# Patient Record
Sex: Female | Born: 1970
Health system: Southern US, Community
[De-identification: ages and names within clinical notes are randomized; demographics above are authoritative.]

## PROBLEM LIST (undated history)

## (undated) ENCOUNTER — Inpatient Hospital Stay (HOSPITAL_COMMUNITY): Payer: MEDICAID | Admitting: PULMONARY DISEASE

## (undated) ENCOUNTER — Inpatient Hospital Stay (INDEPENDENT_AMBULATORY_CARE_PROVIDER_SITE_OTHER): Payer: Self-pay | Admitting: Hematology & Oncology

## (undated) DIAGNOSIS — J449 Chronic obstructive pulmonary disease, unspecified: Secondary | ICD-10-CM

## (undated) DIAGNOSIS — F32A Depression, unspecified: Secondary | ICD-10-CM

## (undated) DIAGNOSIS — F329 Major depressive disorder, single episode, unspecified: Secondary | ICD-10-CM

## (undated) DIAGNOSIS — Z720 Tobacco use: Secondary | ICD-10-CM

## (undated) DIAGNOSIS — R7303 Prediabetes: Secondary | ICD-10-CM

## (undated) DIAGNOSIS — E785 Hyperlipidemia, unspecified: Secondary | ICD-10-CM

## (undated) HISTORY — DX: Chronic obstructive pulmonary disease, unspecified: J44.9

## (undated) HISTORY — DX: Hyperlipidemia, unspecified: E78.5

## (undated) HISTORY — PX: DENTAL SURGERY: SHX609

## (undated) HISTORY — DX: Prediabetes: R73.03

## (undated) HISTORY — PX: OTHER SURGICAL HISTORY: SHX169

## (undated) HISTORY — PX: ECTOPIC PREGNANCY SURGERY: SHX613

---

## 2017-10-03 ENCOUNTER — Emergency Department (HOSPITAL_COMMUNITY)
Admission: EM | Admit: 2017-10-03 | Discharge: 2017-10-03 | Disposition: A | Discharge status: Home / Self Care | Payer: Self-pay | Attending: Emergency Medicine | Admitting: Emergency Medicine

## 2017-10-03 ENCOUNTER — Encounter (HOSPITAL_COMMUNITY): Payer: Self-pay | Admitting: *Deleted

## 2017-10-03 ENCOUNTER — Emergency Department (HOSPITAL_COMMUNITY): Payer: Self-pay

## 2017-10-03 DIAGNOSIS — J441 Chronic obstructive pulmonary disease with (acute) exacerbation: Secondary | ICD-10-CM | POA: Insufficient documentation

## 2017-10-03 DIAGNOSIS — R05 Cough: Secondary | ICD-10-CM | POA: Insufficient documentation

## 2017-10-03 DIAGNOSIS — Z720 Tobacco use: Secondary | ICD-10-CM

## 2017-10-03 DIAGNOSIS — R000 Tachycardia, unspecified: Secondary | ICD-10-CM | POA: Insufficient documentation

## 2017-10-03 DIAGNOSIS — F17210 Nicotine dependence, cigarettes, uncomplicated: Secondary | ICD-10-CM | POA: Insufficient documentation

## 2017-10-03 DIAGNOSIS — J181 Lobar pneumonia, unspecified organism: Secondary | ICD-10-CM | POA: Insufficient documentation

## 2017-10-03 DIAGNOSIS — J189 Pneumonia, unspecified organism: Secondary | ICD-10-CM

## 2017-10-03 LAB — CBC WITH DIFFERENTIAL/PLATELET
Basophils Absolute: 0 10*3/uL (ref 0.0–0.1)
Basophils Relative: 0 %
EOS PCT: 2 %
Eosinophils Absolute: 0.2 10*3/uL (ref 0.0–0.7)
HEMATOCRIT: 40.6 % (ref 36.0–46.0)
HEMOGLOBIN: 13 g/dL (ref 12.0–15.0)
LYMPHS ABS: 0.8 10*3/uL (ref 0.7–4.0)
LYMPHS PCT: 7 %
MCH: 28.4 pg (ref 26.0–34.0)
MCHC: 32 g/dL (ref 30.0–36.0)
MCV: 88.6 fL (ref 78.0–100.0)
MONO ABS: 0.9 10*3/uL (ref 0.1–1.0)
Monocytes Relative: 7 %
Neutro Abs: 9.7 10*3/uL — ABNORMAL HIGH (ref 1.7–7.7)
Neutrophils Relative %: 84 %
Platelets: 387 10*3/uL (ref 150–400)
RBC: 4.58 MIL/uL (ref 3.87–5.11)
RDW: 17.6 % — ABNORMAL HIGH (ref 11.5–15.5)
WBC: 11.6 10*3/uL — ABNORMAL HIGH (ref 4.0–10.5)

## 2017-10-03 LAB — COMPREHENSIVE METABOLIC PANEL
ALK PHOS: 98 U/L (ref 38–126)
ALT: 20 U/L (ref 14–54)
ANION GAP: 10 (ref 5–15)
AST: 28 U/L (ref 15–41)
Albumin: 3.7 g/dL (ref 3.5–5.0)
BILIRUBIN TOTAL: 0.3 mg/dL (ref 0.3–1.2)
BUN: 10 mg/dL (ref 6–20)
CALCIUM: 8.9 mg/dL (ref 8.9–10.3)
CO2: 26 mmol/L (ref 22–32)
Chloride: 101 mmol/L (ref 101–111)
Creatinine, Ser: 0.5 mg/dL (ref 0.44–1.00)
GFR calc non Af Amer: >60 mL/min (ref >60)
Glucose, Bld: 131 mg/dL — ABNORMAL HIGH (ref 65–99)
Potassium: 4.2 mmol/L (ref 3.5–5.1)
Sodium: 137 mmol/L (ref 135–145)
TOTAL PROTEIN: 7 g/dL (ref 6.5–8.1)

## 2017-10-03 MED ORDER — ALBUTEROL SULFATE HFA 108 (90 BASE) MCG/ACT IN AERS
1.0000 | INHALATION_SPRAY | RESPIRATORY_TRACT | Status: DC | PRN
  Filled 2017-10-03: qty 6.7

## 2017-10-03 MED ORDER — ALBUTEROL (5 MG/ML) CONTINUOUS INHALATION SOLN
10.0000 mg/h | INHALATION_SOLUTION | Freq: Once | RESPIRATORY_TRACT | Status: DC
  Filled 2017-10-03: qty 0.5

## 2017-10-03 MED ORDER — DEXTROSE 5 % IV SOLN
500.0000 mg | Freq: Once | INTRAVENOUS | Status: AC
  Administered 2017-10-03: 500 mg via INTRAVENOUS
  Filled 2017-10-03: qty 500

## 2017-10-03 MED ORDER — PREDNISONE 10 MG (21) PO TBPK: ORAL_TABLET | ORAL | 0 refills | Status: DC

## 2017-10-03 MED ORDER — METHYLPREDNISOLONE SODIUM SUCC 125 MG IJ SOLR
125.0000 mg | Freq: Once | INTRAMUSCULAR | Status: AC
  Administered 2017-10-03: 125 mg via INTRAVENOUS
  Filled 2017-10-03: qty 2

## 2017-10-03 MED ORDER — AZITHROMYCIN 250 MG PO TABS: ORAL_TABLET | ORAL | 0 refills | Status: DC

## 2017-10-03 MED ORDER — AEROCHAMBER PLUS FLO-VU MEDIUM MISC
1.0000 | Freq: Once | Status: AC
  Administered 2017-10-03: 1
  Filled 2017-10-03: qty 1

## 2017-10-03 MED ORDER — ALBUTEROL SULFATE (2.5 MG/3ML) 0.083% IN NEBU
10.0000 mg | INHALATION_SOLUTION | Freq: Once | RESPIRATORY_TRACT | Status: AC
Start: 2017-10-03 — End: 2017-10-03
  Administered 2017-10-03: 10 mg via RESPIRATORY_TRACT

## 2017-10-03 MED ORDER — DEXTROSE 5 % IV SOLN
1.0000 g | Freq: Once | INTRAVENOUS | Status: AC
  Administered 2017-10-03: 1 g via INTRAVENOUS
  Filled 2017-10-03: qty 10

## 2017-10-03 NOTE — ED Provider Notes (Signed)
[facility] Provider Note   CSN: [id number] Arrival date & time: 10/03/17  1846     History   Chief Complaint Chief Complaint  Patient presents with  . Shortness of Breath    HPI Elizabeth Villarreal is a 46 y.o. female.  Pt presents to the ED today with sob.  Pt said she's been sob for the past 3-4 days.  She has been coughing up green sputum.  She denies fevers.  She has no hx of copd, but is a long time smoker.  She is trying to quit, however.  O2 sat in triage was 88%.  Pt placed on oxygen and is feeling better.      History reviewed. No pertinent past medical history.  There are no active problems to display for this patient.   History reviewed. No pertinent surgical history.  OB History    No data available       Home Medications    Prior to Admission medications   Medication Sig Start Date End Date Taking? Authorizing Provider  azithromycin (ZITHROMAX) 250 MG tablet Take 1 every day until finished. 10/04/17   Jacalyn LefevreHaviland, Keshawna Dix, MD  predniSONE (STERAPRED UNI-PAK 21 TAB) 10 MG (21) TBPK tablet Take 6 tabs by mouth daily  for 2 days, then 5 tabs for 2 days, then 4 tabs for 2 days, then 3 tabs for 2 days, 2 tabs for 2 days, then 1 tab by mouth daily for 2 days 10/03/17   Jacalyn LefevreHaviland, Jasilyn Holderman, MD    Family History No family history on file.  Social History Social History  Substance Use Topics  . Smoking status: Current Every Day Smoker    Packs/day: 1.00    Types: Cigarettes  . Smokeless tobacco: Never Used  . Alcohol use No     Allergies   Patient has no known allergies.   Review of Systems Review of Systems  Respiratory: Positive for cough, shortness of breath and wheezing.   All other systems reviewed and are negative.    Physical Exam Updated Vital Signs BP 103/63   Pulse (!) 119   Temp 98 F (36.7 C)   Resp 19   LMP 09/12/2017 (Approximate)   SpO2 98%   Physical Exam  Constitutional: She is oriented to  person, place, and time. She appears well-developed and well-nourished.  HENT:  Head: Normocephalic and atraumatic.  Right Ear: External ear normal.  Left Ear: External ear normal.  Nose: Nose normal.  Mouth/Throat: Oropharynx is clear and moist.  Eyes: Pupils are equal, round, and reactive to light. Conjunctivae and EOM are normal.  Neck: Normal range of motion. Neck supple.  Cardiovascular: Regular rhythm, normal heart sounds and intact distal pulses.  Tachycardia present.   Pulmonary/Chest: Effort normal. She has wheezes.  Abdominal: Soft. Bowel sounds are normal.  Musculoskeletal: Normal range of motion.  Neurological: She is alert and oriented to person, place, and time.  Skin: Skin is warm. Capillary refill takes less than 2 seconds.  Psychiatric: She has a normal mood and affect. Her behavior is normal. Judgment and thought content normal.  Nursing note and vitals reviewed.    ED Treatments / Results  Labs (all labs ordered are listed, but only abnormal results are displayed) Labs Reviewed  CBC WITH DIFFERENTIAL/PLATELET - Abnormal; Notable for the following:       Result Value   WBC 11.6 (*)    RDW 17.6 (*)    Neutro Abs 9.7 (*)  All other components within normal limits  COMPREHENSIVE METABOLIC PANEL - Abnormal; Notable for the following:    Glucose, Bld 131 (*)    All other components within normal limits    EKG:  Sinus tachy  HR 120  Radiology Dg Chest 2 View  Result Date: 10/03/2017 CLINICAL DATA:  Dyspnea and productive cough x2 days. EXAM: CHEST  2 VIEW COMPARISON:  None. FINDINGS: Emphysematous hyperinflation of the lungs consistent with COPD. Subtle subpleural right middle and right lower lobe airspace opacities are noted for which pneumonia is not entirely excluded. No pneumothorax or CHF. Heart and mediastinal contours are within normal limits. IMPRESSION: COPD. Subtle airspace opacities in the right mid to lower lung may reflect superimposed pneumonia.  Electronically Signed   By: Tollie Eth M.D.   On: 10/03/2017 19:56    Procedures Procedures (including critical care time)  Medications Ordered in ED Medications  azithromycin (ZITHROMAX) 500 mg in dextrose 5 % 250 mL IVPB (500 mg Intravenous New Bag/Given 10/03/17 2107)  albuterol (PROVENTIL HFA;VENTOLIN HFA) 108 (90 Base) MCG/ACT inhaler 1-2 puff (not administered)  AEROCHAMBER PLUS FLO-VU MEDIUM MISC 1 each (not administered)  methylPREDNISolone sodium succinate (SOLU-MEDROL) 125 mg/2 mL injection 125 mg (125 mg Intravenous Given 10/03/17 2107)  cefTRIAXone (ROCEPHIN) 1 g in dextrose 5 % 50 mL IVPB (1 g Intravenous New Bag/Given 10/03/17 2107)  albuterol (PROVENTIL) (2.5 MG/3ML) 0.083% nebulizer solution 10 mg (10 mg Nebulization Given 10/03/17 2017)     Initial Impression / Assessment and Plan / ED Course  I have reviewed the triage vital signs and the nursing notes.  Pertinent labs & imaging results that were available during my care of the patient were reviewed by me and considered in my medical decision making (see chart for details).    Pt is feeling much better after continuous neb and IV solumedrol.  She is able to ambulate without getting sob.  She knows to stop smoking.  She knows to return if worse.  She is given an albuterol mdi with spacer prior to d/c.  Final Clinical Impressions(s) / ED Diagnoses   Final diagnoses:  COPD exacerbation (HCC)  Tobacco abuse  Community acquired pneumonia of right lower lobe of lung (HCC)    New Prescriptions New Prescriptions   AZITHROMYCIN (ZITHROMAX) 250 MG TABLET    Take 1 every day until finished.   PREDNISONE (STERAPRED UNI-PAK 21 TAB) 10 MG (21) TBPK TABLET    Take 6 tabs by mouth daily  for 2 days, then 5 tabs for 2 days, then 4 tabs for 2 days, then 3 tabs for 2 days, 2 tabs for 2 days, then 1 tab by mouth daily for 2 days     Jacalyn Lefevre, MD 10/03/17 2147

## 2017-10-03 NOTE — ED Notes (Signed)
Ambulated pt in hall, O2 dropped to 88 once but went back up to the 90s.

## 2017-10-03 NOTE — ED Triage Notes (Signed)
Pt in c/o SOB with no productive cough onset x 3-4 days, pt denies CP, denies n/v/d, pt A&O x4

## 2017-10-03 NOTE — ED Notes (Signed)
Pt taken to xray then will go to room

## 2017-10-03 NOTE — ED Notes (Signed)
  CAT started.  

## 2018-01-28 ENCOUNTER — Inpatient Hospital Stay (HOSPITAL_COMMUNITY)
Admission: EM | Admit: 2018-01-28 | Discharge: 2018-01-31 | DRG: 190 | Disposition: A | Discharge status: Home / Self Care | Payer: Medicaid Other | Attending: Internal Medicine | Admitting: Internal Medicine

## 2018-01-28 ENCOUNTER — Emergency Department (HOSPITAL_COMMUNITY): Payer: Medicaid Other

## 2018-01-28 ENCOUNTER — Other Ambulatory Visit: Payer: Self-pay

## 2018-01-28 DIAGNOSIS — Z720 Tobacco use: Secondary | ICD-10-CM

## 2018-01-28 DIAGNOSIS — J441 Chronic obstructive pulmonary disease with (acute) exacerbation: Principal | ICD-10-CM | POA: Diagnosis present

## 2018-01-28 DIAGNOSIS — Z87891 Personal history of nicotine dependence: Secondary | ICD-10-CM | POA: Diagnosis present

## 2018-01-28 DIAGNOSIS — Z825 Family history of asthma and other chronic lower respiratory diseases: Secondary | ICD-10-CM

## 2018-01-28 DIAGNOSIS — J449 Chronic obstructive pulmonary disease, unspecified: Secondary | ICD-10-CM | POA: Diagnosis present

## 2018-01-28 DIAGNOSIS — R109 Unspecified abdominal pain: Secondary | ICD-10-CM | POA: Diagnosis present

## 2018-01-28 DIAGNOSIS — Z681 Body mass index (BMI) 19 or less, adult: Secondary | ICD-10-CM

## 2018-01-28 DIAGNOSIS — R911 Solitary pulmonary nodule: Secondary | ICD-10-CM | POA: Diagnosis present

## 2018-01-28 DIAGNOSIS — R0902 Hypoxemia: Secondary | ICD-10-CM

## 2018-01-28 DIAGNOSIS — Z23 Encounter for immunization: Secondary | ICD-10-CM

## 2018-01-28 DIAGNOSIS — E43 Unspecified severe protein-calorie malnutrition: Secondary | ICD-10-CM | POA: Diagnosis present

## 2018-01-28 DIAGNOSIS — J9621 Acute and chronic respiratory failure with hypoxia: Secondary | ICD-10-CM | POA: Diagnosis present

## 2018-01-28 DIAGNOSIS — Z801 Family history of malignant neoplasm of trachea, bronchus and lung: Secondary | ICD-10-CM

## 2018-01-28 DIAGNOSIS — F17210 Nicotine dependence, cigarettes, uncomplicated: Secondary | ICD-10-CM | POA: Diagnosis present

## 2018-01-28 DIAGNOSIS — R52 Pain, unspecified: Secondary | ICD-10-CM

## 2018-01-28 HISTORY — DX: Chronic obstructive pulmonary disease, unspecified: J44.9

## 2018-01-28 HISTORY — DX: Tobacco use: Z72.0

## 2018-01-28 LAB — BASIC METABOLIC PANEL
ANION GAP: 11 (ref 5–15)
BUN: <5 mg/dL — ABNORMAL LOW (ref 6–20)
CALCIUM: 9.1 mg/dL (ref 8.9–10.3)
CO2: 22 mmol/L (ref 22–32)
CREATININE: 0.57 mg/dL (ref 0.44–1.00)
Chloride: 106 mmol/L (ref 101–111)
Glucose, Bld: 110 mg/dL — ABNORMAL HIGH (ref 65–99)
Potassium: 4 mmol/L (ref 3.5–5.1)
SODIUM: 139 mmol/L (ref 135–145)

## 2018-01-28 LAB — I-STAT CG4 LACTIC ACID, ED: LACTIC ACID, VENOUS: 0.41 mmol/L — AB (ref 0.5–1.9)

## 2018-01-28 LAB — I-STAT BETA HCG BLOOD, ED (MC, WL, AP ONLY)

## 2018-01-28 LAB — CBC
HCT: 42.1 % (ref 36.0–46.0)
HEMOGLOBIN: 13.7 g/dL (ref 12.0–15.0)
MCH: 28.1 pg (ref 26.0–34.0)
MCHC: 32.5 g/dL (ref 30.0–36.0)
MCV: 86.4 fL (ref 78.0–100.0)
PLATELETS: 408 10*3/uL — AB (ref 150–400)
RBC: 4.87 MIL/uL (ref 3.87–5.11)
WBC: 12.7 10*3/uL — AB (ref 4.0–10.5)

## 2018-01-28 MED ORDER — IPRATROPIUM-ALBUTEROL 0.5-2.5 (3) MG/3ML IN SOLN
3.0000 mL | Freq: Once | RESPIRATORY_TRACT | Status: AC
  Administered 2018-01-28: 3 mL via RESPIRATORY_TRACT
  Filled 2018-01-28: qty 3

## 2018-01-28 MED ORDER — IOPAMIDOL (ISOVUE-370) INJECTION 76%
INTRAVENOUS | Status: AC
  Administered 2018-01-28: 100 mL
  Filled 2018-01-28: qty 100

## 2018-01-28 MED ORDER — FENTANYL CITRATE (PF) 100 MCG/2ML IJ SOLN
50.0000 ug | Freq: Once | INTRAMUSCULAR | Status: AC
  Administered 2018-01-28: 50 ug via INTRAVENOUS
  Filled 2018-01-28: qty 2

## 2018-01-28 MED ORDER — CYCLOBENZAPRINE HCL 10 MG PO TABS
10.0000 mg | ORAL_TABLET | Freq: Once | ORAL | Status: DC
  Filled 2018-01-28: qty 1

## 2018-01-28 MED ORDER — FENTANYL CITRATE (PF) 100 MCG/2ML IJ SOLN
50.0000 ug | Freq: Once | INTRAMUSCULAR | Status: DC
  Filled 2018-01-28: qty 2

## 2018-01-28 NOTE — ED Provider Notes (Signed)
[facility] EMERGENCY DEPARTMENT Provider Note   CSN: [id number] Arrival date & time: 01/28/18  1946     History   Chief Complaint Chief Complaint  Patient presents with  . Left Rib Pain  . Productive Cough    HPI Elizabeth Villarreal is a 47 y.o. female.  Patient with PMH of COPD presents to the ED with a chief complaint of left sided chest/rib pain and SOB that started on Wednesday and worsened today.  She reports severe pain.  She states that it is difficult to catch her breath.  She states that she was diagnosed with pneumonia and COPD at the end of last year.  She states that she has had a bad cough, but denies running a fever.  She reports having a hard time gaining weight.  She denies any lower extremity pain or swelling, but states that it is hard to be up or active for long because of her SOB.  She denies hx of PE/DVT.  She states that today she rolled over in bed and felt something "pop" in her left side/ribs, followed by severe pain.   The history is provided by the patient. No language interpreter was used.    No past medical history on file.  There are no active problems to display for this patient.   No past surgical history on file.  OB History    No data available       Home Medications    Prior to Admission medications   Medication Sig Start Date End Date Taking? Authorizing Provider  azithromycin (ZITHROMAX) 250 MG tablet Take 1 every day until finished. 10/04/17   Jacalyn Lefevre, MD  Cyanocobalamin (VITAMIN B-12 PO) Take 1 tablet by mouth daily.     [provider]  ferrous sulfate 325 (65 FE) MG EC tablet Take 325 mg by mouth 2 (two) times a week.    [provider]  guaiFENesin-dextromethorphan (ROBITUSSIN DM) 100-10 MG/5ML syrup Take 5-10 mLs by mouth every 4 (four) hours as needed for cough.    [provider]  Multiple Vitamins-Calcium (ONE-A-DAY WOMENS FORMULA PO) Take 1 tablet by mouth daily.     [provider]  predniSONE (STERAPRED UNI-PAK 21 TAB) 10 MG (21) TBPK tablet Take 6 tabs by mouth daily  for 2 days, then 5 tabs for 2 days, then 4 tabs for 2 days, then 3 tabs for 2 days, 2 tabs for 2 days, then 1 tab by mouth daily for 2 days 10/03/17   Jacalyn Lefevre, MD    Family History No family history on file.  Social History Social History   Tobacco Use  . Smoking status: Current Every Day Smoker    Packs/day: 1.00    Types: Cigarettes  . Smokeless tobacco: Never Used  Substance Use Topics  . Alcohol use: No  . Drug use: No     Allergies   Darvon [propoxyphene] and Percocet [oxycodone-acetaminophen]   Review of Systems Review of Systems  All other systems reviewed and are negative.    Physical Exam Updated Vital Signs BP (!) 136/92 (BP Location: Right Arm)   Pulse 98   Temp 98.7 F (37.1 C) (Oral)   Resp 18   Ht 5\' 8"  (1.727 m)   Wt 44 kg (97 lb)   LMP 01/14/2018   SpO2 94%   BMI 14.75 kg/m   Physical Exam  Constitutional: She is oriented to person, place, and time.  Frail, chronically ill appearing  HENT:  Head: Normocephalic and atraumatic.  Eyes: Conjunctivae and EOM are normal. Pupils are equal, round, and reactive to light.  Neck: Normal range of motion. Neck supple.  Cardiovascular: Regular rhythm. Exam reveals no gallop and no friction rub.  No murmur heard. tachycardic  Pulmonary/Chest: Effort normal and breath sounds normal. No respiratory distress. She has no wheezes. She has no rales. She exhibits tenderness.  Left lateral chest wall/ribs TTP  Abdominal: Soft. Bowel sounds are normal. She exhibits no distension and no mass. There is no tenderness. There is no rebound and no guarding.  Musculoskeletal: Normal range of motion. She exhibits no edema or tenderness.  No swelling or tenderness of lower extremities  Neurological: She is alert and oriented to person, place, and time.  Skin: Skin is warm and dry.  Psychiatric: She has a normal  mood and affect. Her behavior is normal. Judgment and thought content normal.  Nursing note and vitals reviewed.    ED Treatments / Results  Labs (all labs ordered are listed, but only abnormal results are displayed) Labs Reviewed  BASIC METABOLIC PANEL - Abnormal; Notable for the following components:      Result Value   Glucose, Bld 110 (*)    BUN <5 (*)    All other components within normal limits  CBC - Abnormal; Notable for the following components:   WBC 12.7 (*)    Platelets 408 (*)    All other components within normal limits  I-STAT CG4 LACTIC ACID, ED - Abnormal; Notable for the following components:   Lactic Acid, Venous 0.41 (*)    All other components within normal limits  CULTURE, BLOOD (ROUTINE X 2)  CULTURE, BLOOD (ROUTINE X 2)  CULTURE, EXPECTORATED SPUTUM-ASSESSMENT  GRAM STAIN  INFLUENZA PANEL BY PCR (TYPE A & B)  HIV ANTIBODY (ROUTINE TESTING)  STREP PNEUMONIAE URINARY ANTIGEN  I-STAT BETA HCG BLOOD, ED ([facility], [facility], [facility] ONLY)  I-STAT CG4 LACTIC ACID, ED    EKG  EKG Interpretation  Date/Time:  Sunday January 28 2018 22:39:50 EST Ventricular Rate:  94 PR Interval:    QRS Duration: 92 QT Interval:  336 QTC Calculation: 421 R Axis:   -93 Text Interpretation:  Sinus rhythm Right atrial enlargement Markedly posterior QRS axis Since last tracing rate slower Confirmed by Doug SouJacubowitz, Sam [phone]([id number]) on 01/28/2018 11:22:11 PM       Radiology Dg Chest 2 View  Result Date: 01/28/2018 CLINICAL DATA:  Rib pain, productive cough EXAM: CHEST  2 VIEW COMPARISON:  10/03/2017 FINDINGS: Severe COPD changes. Heart and mediastinal contours are within normal limits. No focal opacities or effusions. No acute bony abnormality. IMPRESSION: Severe COPD.  No active cardiopulmonary disease. Electronically Signed   By: Charlett NoseKevin  Dover M.D.   On: 01/28/2018 20:41    Procedures Procedures (including critical care time)  Medications Ordered in ED Medications  fentaNYL (SUBLIMAZE)  injection 50 mcg (not administered)     Initial Impression / Assessment and Plan / ED Course  I have reviewed the triage vital signs and the nursing notes.  Pertinent labs & imaging results that were available during my care of the patient were reviewed by me and considered in my medical decision making (see chart for details).     Patient with left sided chest/ribs pain, SOB, hypoxia, and bad cough.  Noted to be satting at 86% on RA on arrival.  Feels SOB.  Afebrile.  Mild leukocytosis.  Patient also noted to be tachycardic.  High degree of suspicion  for PE, will proceed with CT to further evaluated CP and hypoxia.  Will give duoneb.  CT is negative for PE, there is debris in the trachea and bronchi consistent with aspiration.  Patient does have a moderate leukocytosis, however she is afebrile.  She does have a coarse wet cough.  Given her tachycardia, hypoxia, leukocytosis with debris in the trachea and bronchi, will cover with broad-spectrum antibiotics for aspiration, however symptoms could also be secondary to COPD exacerbation.  Patient will require admission due to hypoxia.  Discussed with Dr. Judd Lien, who agrees with the plan.    Final Clinical Impressions(s) / ED Diagnoses   Final diagnoses:  Hypoxia    ED Discharge Orders    None       Roxy Horseman, PA-C 01/29/18 0128    Doug Sou, MD 01/29/18 [phone]

## 2018-01-28 NOTE — H&P (Addendum)
History and Physical    Elizabeth AsaMary Villarreal NWG:[id number] DOB: [birth year] DOA: 01/28/2018  Referring MD/NP/PA:   PCP: System, Pcp Not In   Patient coming from:  The patient is coming from home.  At baseline, pt is independent for most of ADL.   Chief Complaint: SOB, cough and left flank pain  HPI: Elizabeth AsaMary Villarreal is a 47 y.o. female with medical history significant of tobacco abuse, COPD, who presents with cough, shortness breath, left flank pain.  Patient states that she has been having cough and shortness of breath in the past 4 days, which has been progressively getting worse. She coughs up greenish colored sputum. She also reports left flank pain, which is constant, sharp, 7 out of 10 in severity, aggravated by deep breath and coughing. No tenderness in the calf areas. She states that she was diagnosed with pneumonia and COPD at the end of last year. Patient does not have runny nose, sore throat. No fever or chills. Patient states that she has always been having poor appetite and having a hard time gaining weight. Patient speaks in full sentence.  ED Course: pt was found to have WBC 12.2, electrolytes renal function okay, negative pregnancy test, temperature normal, heart rate in 90s, oxygen saturation 86% on room air. Chest x-ray showed emphysematous changes and COPD. Patient is placed on telemetry bed for observation.  CT angiogram of the chest showed:  1. No acute pulmonary embolism. 2. Debris within trachea and bronchi concerning for aspiration. 3. Severe emphysema. 4. 14 mm RIGHT lower lobe pulmonary nodule with central calcification most compatible with healing granuloma.   Review of Systems:   General: no fevers, chills, no body weight gain, has poor appetite, has fatigue HEENT: no blurry vision, hearing changes or sore throat Respiratory: has dyspnea, coughing, no wheezing CV: no chest pain, no palpitations GI: no nausea, vomiting, abdominal pain, diarrhea, constipation GU: no dysuria,  burning on urination, increased urinary frequency, hematuria  Ext: no leg edema Neuro: no unilateral weakness, numbness, or tingling, no vision change or hearing loss Skin: no rash, no skin tear. MSK: No muscle spasm, no deformity, no limitation of range of movement in spin Heme: No easy bruising.  Travel history: No recent long distant travel.  Allergy:  Allergies  Allergen Reactions  . Darvon [Propoxyphene] Other (See Comments)    Patient was aware her spoken words were not matching her thoughts  . Percocet [Oxycodone-Acetaminophen] Other (See Comments)    Patient was aware her spoken words were not matching her thoughts    Past Medical History:  Diagnosis Date  . COPD (chronic obstructive pulmonary disease) (HCC)   . Tobacco abuse     Past Surgical History:  Procedure Laterality Date  . CESAREAN SECTION    . ovary cyst rupture      Social History:  reports that she has been smoking cigarettes.  She has been smoking about 1.00 pack per day. she has never used smokeless tobacco. She reports that she does not drink alcohol or use drugs.  Family History:  Family History  Problem Relation Age of Onset  . COPD Mother   . Lung cancer Father      Prior to Admission medications   Medication Sig Start Date End Date Taking? Authorizing Provider  azithromycin (ZITHROMAX) 250 MG tablet Take 1 every day until finished. 10/04/17   Jacalyn LefevreHaviland, Julie, MD  Cyanocobalamin (VITAMIN B-12 PO) Take 1 tablet by mouth daily.     [provider]  ferrous sulfate  325 (65 FE) MG EC tablet Take 325 mg by mouth 2 (two) times a week.    [provider]  guaiFENesin-dextromethorphan (ROBITUSSIN DM) 100-10 MG/5ML syrup Take 5-10 mLs by mouth every 4 (four) hours as needed for cough.    [provider]  Multiple Vitamins-Calcium (ONE-A-DAY WOMENS FORMULA PO) Take 1 tablet by mouth daily.     [provider]  predniSONE (STERAPRED UNI-PAK 21 TAB) 10 MG (21) TBPK tablet  Take 6 tabs by mouth daily  for 2 days, then 5 tabs for 2 days, then 4 tabs for 2 days, then 3 tabs for 2 days, 2 tabs for 2 days, then 1 tab by mouth daily for 2 days 10/03/17   Jacalyn Lefevre, MD    Physical Exam: Vitals:   01/28/18 2000 01/28/18 2002  BP:  (!) 136/92  Pulse:  98  Resp:  18  Temp:  98.7 F (37.1 C)  TempSrc:  Oral  SpO2:  94%  Weight: 44 kg (97 lb)   Height: 5\' 8"  (1.727 m)    General: Not in acute distress. Cachectic looking.  HEENT:       Eyes: PERRL, EOMI, no scleral icterus.       ENT: No discharge from the ears and nose, no pharynx injection, no tonsillar enlargement.        Neck: No JVD, no bruit, no mass felt. Heme: No neck lymph node enlargement. Cardiac: S1/S2, RRR, No murmurs, No gallops or rubs. Respiratory: Has mild rhonchi bilaterally GI: Soft, nondistended, nontender, no rebound pain, no organomegaly, BS present. GU: No hematuria Ext: No pitting leg edema bilaterally. 2+DP/PT pulse bilaterally. Musculoskeletal: No joint deformities, No joint redness or warmth, no limitation of ROM in spin. Skin: No rashes.  Neuro: Alert, oriented X3, cranial nerves II-XII grossly intact, moves all extremities normally. Psych: Patient is not psychotic, no suicidal or hemocidal ideation.  Labs on Admission: I have personally reviewed following labs and imaging studies  CBC: Recent Labs  Lab 01/28/18 2009  WBC 12.7*  HGB 13.7  HCT 42.1  MCV 86.4  PLT 408*   Basic Metabolic Panel: Recent Labs  Lab 01/28/18 2009  NA 139  K 4.0  CL 106  CO2 22  GLUCOSE 110*  BUN <5*  CREATININE 0.57  CALCIUM 9.1   GFR: Estimated Creatinine Clearance: 61 mL/min (by C-G formula based on SCr of 0.57 mg/dL). Liver Function Tests: No results for input(s): AST, ALT, ALKPHOS, BILITOT, PROT, ALBUMIN in the last 168 hours. No results for input(s): LIPASE, AMYLASE in the last 168 hours. No results for input(s): AMMONIA in the last 168 hours. Coagulation Profile: No  results for input(s): INR, PROTIME in the last 168 hours. Cardiac Enzymes: No results for input(s): CKTOTAL, CKMB, CKMBINDEX, TROPONINI in the last 168 hours. BNP (last 3 results) No results for input(s): PROBNP in the last 8760 hours. HbA1C: No results for input(s): HGBA1C in the last 72 hours. CBG: No results for input(s): GLUCAP in the last 168 hours. Lipid Profile: No results for input(s): CHOL, HDL, LDLCALC, TRIG, CHOLHDL, LDLDIRECT in the last 72 hours. Thyroid Function Tests: No results for input(s): TSH, T4TOTAL, FREET4, T3FREE, THYROIDAB in the last 72 hours. Anemia Panel: No results for input(s): VITAMINB12, FOLATE, FERRITIN, TIBC, IRON, RETICCTPCT in the last 72 hours. Urine analysis: No results found for: COLORURINE, APPEARANCEUR, LABSPEC, PHURINE, GLUCOSEU, HGBUR, BILIRUBINUR, KETONESUR, PROTEINUR, UROBILINOGEN, NITRITE, LEUKOCYTESUR Sepsis Labs: @LABRCNTIP (procalcitonin:4,lacticidven:4) )No results found for this or any previous visit (from the  past 240 hour(s)).   Radiological Exams on Admission: Dg Chest 2 View  Result Date: 01/28/2018 CLINICAL DATA:  Rib pain, productive cough EXAM: CHEST  2 VIEW COMPARISON:  10/03/2017 FINDINGS: Severe COPD changes. Heart and mediastinal contours are within normal limits. No focal opacities or effusions. No acute bony abnormality. IMPRESSION: Severe COPD.  No active cardiopulmonary disease. Electronically Signed   By: Charlett Nose M.D.   On: 01/28/2018 20:41   Ct Angio Chest Pe W And/or Wo Contrast  Result Date: 01/28/2018 CLINICAL DATA:  Worsening RIGHT rib pain radiating to LEFT back. Shortness of breath. History of COPD. EXAM: CT ANGIOGRAPHY CHEST WITH CONTRAST TECHNIQUE: Multidetector CT imaging of the chest was performed using the standard protocol during bolus administration of intravenous contrast. Multiplanar CT image reconstructions and MIPs were obtained to evaluate the vascular anatomy. CONTRAST:  ISOVUE-370 IOPAMIDOL  (ISOVUE-370) INJECTION 76% COMPARISON:  Chest radiograph January 28, 2018 FINDINGS: CARDIOVASCULAR: Adequate contrast opacification of the pulmonary artery's. Main pulmonary artery is not enlarged. No pulmonary arterial filling defects to the level of the subsegmental branches. Heart size is normal, no right heart strain. No pericardial effusion. Thoracic aorta is normal course and caliber, unremarkable. MEDIASTINUM/NODES: No lymphadenopathy by CT size criteria. LUNGS/PLEURA: Debris layering within trachea and bilateral mainstem bronchi. Bronchial wall thickening. Severe centrilobular emphysema most apparent within lung apices. No pleural effusion or focal consolidation. Lobulated 15 mm solid nodule superior segment RIGHT lower lobe with calcification (68/157). Lingular scarring. UPPER ABDOMEN: Trace calcific atherosclerosis abdominal aorta. MUSCULOSKELETAL: Nonacute. Patient is cachectic. Centered thoracic kyphosis with upper thoracic mild spondylosis. Review of the MIP images confirms the above findings. IMPRESSION: 1. No acute pulmonary embolism. 2. Debris within trachea and bronchi concerning for aspiration. 3. Severe emphysema. 4. 14 mm RIGHT lower lobe pulmonary nodule with central calcification most compatible with healing granuloma. No routine indicated follow-up by consensus. Emphysema (ICD10-J43.9). Electronically Signed   By: Awilda Metro M.D.   On: 01/28/2018 23:23     EKG: Independently reviewed.  Sinus rhythm, QTC 421, low voltage, right atrial enlargement.   Assessment/Plan Principal Problem:   COPD exacerbation (HCC) Active Problems:   Tobacco abuse   Acute on chronic respiratory failure with hypoxia (HCC)   Protein-calorie malnutrition, severe (HCC)   Acute on chronic respiratory failure with hypoxia due to COPD exacerbation (HCC): -will place on tele bed for obs -Nebulizers: scheduled Duoneb and prn albuterol -Solu-Medrol 40 mg IV bid -Doxycycline -Flagyl due to  concerning for aspiration -Mucinex for cough  -Incentive spirometry -Urine S. pneumococcal antigen -Follow up blood culture x2, sputum culture, Flu pcr -Nasal cannula oxygen as needed to maintain O2 saturation 92% or greater -prn Norco and flexeril for left flank pain  Tobacco abuse : -Did counseling about importance of quitting smoking -Nicotine patch  Protein-calorie malnutrition, severe (HCC): -nutrition consult  Lung nodule: CAT showed 14 mm RIGHT lower lobe pulmonary nodule with central calcification, which is most compatible with healing granuloma per the radiologist's report -f/u with PCP   DVT ppx:   SQ Lovenox Code Status: Full code Family Communication:      Yes, patient's daughter at bed side Disposition Plan:  Anticipate discharge back to previous home environment Consults called:  none Admission status: Obs / tele     Date of Service 01/29/2018    Lorretta Harp [facility] Pager [phone]  If 7PM-7AM, please contact night-coverage [email or web address] Password [facility] 01/29/2018, 12:34 AM

## 2018-01-28 NOTE — ED Triage Notes (Signed)
Pt reports ongoing and worsening rib pain to the back left side. Pt reports that she woke a few days ago and felt that when she woke up she felt like she has  "cracked" rib and the pain has gotten progressively worse. Pt diagnosed with COPD in October after admission for PNA. Pt states she has had a continuous productive cough for weeks.

## 2018-01-28 NOTE — ED Notes (Signed)
O2 sat 86% on room air on arrival to room.  Placed on O2 at 2L via [place].  To continue to monitor.

## 2018-01-29 ENCOUNTER — Encounter (HOSPITAL_COMMUNITY): Payer: Self-pay | Admitting: Internal Medicine

## 2018-01-29 ENCOUNTER — Other Ambulatory Visit: Payer: Self-pay

## 2018-01-29 DIAGNOSIS — J9621 Acute and chronic respiratory failure with hypoxia: Secondary | ICD-10-CM | POA: Diagnosis present

## 2018-01-29 DIAGNOSIS — R52 Pain, unspecified: Secondary | ICD-10-CM | POA: Diagnosis present

## 2018-01-29 DIAGNOSIS — Z23 Encounter for immunization: Secondary | ICD-10-CM | POA: Diagnosis not present

## 2018-01-29 DIAGNOSIS — Z87891 Personal history of nicotine dependence: Secondary | ICD-10-CM | POA: Diagnosis present

## 2018-01-29 DIAGNOSIS — E43 Unspecified severe protein-calorie malnutrition: Secondary | ICD-10-CM | POA: Diagnosis present

## 2018-01-29 DIAGNOSIS — Z681 Body mass index (BMI) 19 or less, adult: Secondary | ICD-10-CM | POA: Diagnosis not present

## 2018-01-29 DIAGNOSIS — R911 Solitary pulmonary nodule: Secondary | ICD-10-CM | POA: Diagnosis present

## 2018-01-29 DIAGNOSIS — J449 Chronic obstructive pulmonary disease, unspecified: Secondary | ICD-10-CM | POA: Diagnosis present

## 2018-01-29 DIAGNOSIS — Z801 Family history of malignant neoplasm of trachea, bronchus and lung: Secondary | ICD-10-CM | POA: Diagnosis not present

## 2018-01-29 DIAGNOSIS — F17210 Nicotine dependence, cigarettes, uncomplicated: Secondary | ICD-10-CM | POA: Diagnosis present

## 2018-01-29 DIAGNOSIS — Z825 Family history of asthma and other chronic lower respiratory diseases: Secondary | ICD-10-CM | POA: Diagnosis not present

## 2018-01-29 DIAGNOSIS — J441 Chronic obstructive pulmonary disease with (acute) exacerbation: Secondary | ICD-10-CM | POA: Diagnosis not present

## 2018-01-29 DIAGNOSIS — R109 Unspecified abdominal pain: Secondary | ICD-10-CM | POA: Diagnosis present

## 2018-01-29 LAB — HIV ANTIBODY (ROUTINE TESTING W REFLEX): HIV Screen 4th Generation wRfx: NONREACTIVE

## 2018-01-29 LAB — I-STAT CG4 LACTIC ACID, ED: Lactic Acid, Venous: 0.57 mmol/L (ref 0.5–1.9)

## 2018-01-29 LAB — STREP PNEUMONIAE URINARY ANTIGEN: Strep Pneumo Urinary Antigen: NEGATIVE

## 2018-01-29 LAB — INFLUENZA PANEL BY PCR (TYPE A & B)
INFLAPCR: NEGATIVE
INFLBPCR: NEGATIVE

## 2018-01-29 MED ORDER — METHYLPREDNISOLONE SODIUM SUCC 40 MG IJ SOLR
40.0000 mg | Freq: Two times a day (BID) | INTRAMUSCULAR | Status: DC
  Administered 2018-01-29 – 2018-01-30 (×5): 40 mg via INTRAVENOUS
  Filled 2018-01-29 (×5): qty 1

## 2018-01-29 MED ORDER — ZOLPIDEM TARTRATE 5 MG PO TABS: 5.0000 mg | ORAL_TABLET | Freq: Every evening | ORAL | Status: DC | PRN

## 2018-01-29 MED ORDER — SODIUM CHLORIDE 0.9 % IV SOLN
INTRAVENOUS | Status: DC
  Administered 2018-01-29 (×2): via INTRAVENOUS

## 2018-01-29 MED ORDER — ACETAMINOPHEN 325 MG PO TABS: 650.0000 mg | ORAL_TABLET | Freq: Four times a day (QID) | ORAL | Status: DC | PRN

## 2018-01-29 MED ORDER — IPRATROPIUM-ALBUTEROL 0.5-2.5 (3) MG/3ML IN SOLN
3.0000 mL | Freq: Four times a day (QID) | RESPIRATORY_TRACT | Status: DC
  Administered 2018-01-30 – 2018-01-31 (×6): 3 mL via RESPIRATORY_TRACT
  Filled 2018-01-29 (×6): qty 3

## 2018-01-29 MED ORDER — PNEUMOCOCCAL VAC POLYVALENT 25 MCG/0.5ML IJ INJ
0.5000 mL | INJECTION | INTRAMUSCULAR | Status: AC
  Administered 2018-01-30: 0.5 mL via INTRAMUSCULAR
  Filled 2018-01-29: qty 0.5

## 2018-01-29 MED ORDER — NICOTINE 21 MG/24HR TD PT24
21.0000 mg | MEDICATED_PATCH | Freq: Every day | TRANSDERMAL | Status: DC
Start: 2018-01-29 — End: 2018-01-31
  Administered 2018-01-29 – 2018-01-31 (×3): 21 mg via TRANSDERMAL
  Filled 2018-01-29 (×3): qty 1

## 2018-01-29 MED ORDER — ADULT MULTIVITAMIN W/MINERALS CH
1.0000 | ORAL_TABLET | Freq: Every day | ORAL | Status: DC
  Administered 2018-01-29 – 2018-01-31 (×3): 1 via ORAL
  Filled 2018-01-29 (×3): qty 1

## 2018-01-29 MED ORDER — ONDANSETRON HCL 4 MG/2ML IJ SOLN: 4.0000 mg | Freq: Three times a day (TID) | INTRAMUSCULAR | Status: DC | PRN

## 2018-01-29 MED ORDER — GUAIFENESIN-DM 100-10 MG/5ML PO SYRP
5.0000 mL | ORAL_SOLUTION | ORAL | Status: DC | PRN
  Administered 2018-01-29: 5 mL via ORAL
  Administered 2018-01-29: 10 mL via ORAL
  Administered 2018-01-30: 5 mL via ORAL
  Filled 2018-01-29: qty 5
  Filled 2018-01-29: qty 10
  Filled 2018-01-29: qty 5

## 2018-01-29 MED ORDER — CYCLOBENZAPRINE HCL 10 MG PO TABS
10.0000 mg | ORAL_TABLET | Freq: Once | ORAL | Status: AC
  Administered 2018-01-29: 10 mg via ORAL

## 2018-01-29 MED ORDER — HYDROCODONE-ACETAMINOPHEN 5-325 MG PO TABS
1.0000 | ORAL_TABLET | Freq: Four times a day (QID) | ORAL | Status: DC | PRN
  Administered 2018-01-29: 1 via ORAL
  Filled 2018-01-29: qty 1

## 2018-01-29 MED ORDER — FERROUS SULFATE 325 (65 FE) MG PO TABS
325.0000 mg | ORAL_TABLET | ORAL | Status: DC
  Administered 2018-01-29: 325 mg via ORAL
  Filled 2018-01-29: qty 1

## 2018-01-29 MED ORDER — SODIUM CHLORIDE 0.9 % IV BOLUS (SEPSIS)
1500.0000 mL | Freq: Once | INTRAVENOUS | Status: AC
  Administered 2018-01-29: 1500 mL via INTRAVENOUS

## 2018-01-29 MED ORDER — METRONIDAZOLE 500 MG PO TABS
500.0000 mg | ORAL_TABLET | Freq: Three times a day (TID) | ORAL | Status: DC
  Administered 2018-01-29: 500 mg via ORAL
  Filled 2018-01-29 (×2): qty 1

## 2018-01-29 MED ORDER — INFLUENZA VAC SPLIT QUAD 0.5 ML IM SUSY
0.5000 mL | PREFILLED_SYRINGE | INTRAMUSCULAR | Status: AC
  Administered 2018-01-30: 0.5 mL via INTRAMUSCULAR
  Filled 2018-01-29: qty 0.5

## 2018-01-29 MED ORDER — ENOXAPARIN SODIUM 30 MG/0.3ML ~~LOC~~ SOLN
30.0000 mg | SUBCUTANEOUS | Status: DC
  Administered 2018-01-29 – 2018-01-31 (×3): 30 mg via SUBCUTANEOUS
  Filled 2018-01-29 (×4): qty 0.3

## 2018-01-29 MED ORDER — ALBUTEROL SULFATE (2.5 MG/3ML) 0.083% IN NEBU: 2.5000 mg | INHALATION_SOLUTION | RESPIRATORY_TRACT | Status: DC | PRN

## 2018-01-29 MED ORDER — CYCLOBENZAPRINE HCL 10 MG PO TABS
5.0000 mg | ORAL_TABLET | Freq: Three times a day (TID) | ORAL | Status: DC | PRN
  Administered 2018-01-29 – 2018-01-30 (×5): 5 mg via ORAL
  Filled 2018-01-29 (×5): qty 1

## 2018-01-29 MED ORDER — METRONIDAZOLE 500 MG PO TABS
500.0000 mg | ORAL_TABLET | Freq: Three times a day (TID) | ORAL | Status: DC
  Administered 2018-01-29 – 2018-01-31 (×6): 500 mg via ORAL
  Filled 2018-01-29 (×5): qty 1

## 2018-01-29 MED ORDER — IPRATROPIUM-ALBUTEROL 0.5-2.5 (3) MG/3ML IN SOLN
3.0000 mL | RESPIRATORY_TRACT | Status: DC
  Administered 2018-01-29 (×5): 3 mL via RESPIRATORY_TRACT
  Filled 2018-01-29 (×5): qty 3

## 2018-01-29 MED ORDER — DOXYCYCLINE HYCLATE 100 MG PO TABS
100.0000 mg | ORAL_TABLET | Freq: Two times a day (BID) | ORAL | Status: DC
  Administered 2018-01-29 – 2018-01-31 (×5): 100 mg via ORAL
  Filled 2018-01-29 (×5): qty 1

## 2018-01-29 MED ORDER — VITAMIN B-12 100 MCG PO TABS
100.0000 ug | ORAL_TABLET | Freq: Every day | ORAL | Status: DC
  Administered 2018-01-29 – 2018-01-31 (×3): 100 ug via ORAL
  Filled 2018-01-29 (×3): qty 1

## 2018-01-29 MED ORDER — FENTANYL CITRATE (PF) 100 MCG/2ML IJ SOLN
50.0000 ug | Freq: Once | INTRAMUSCULAR | Status: AC
  Administered 2018-01-29: 50 ug via INTRAVENOUS

## 2018-01-29 MED ORDER — SODIUM CHLORIDE 0.9 % IV BOLUS (SEPSIS)
1000.0000 mL | Freq: Once | INTRAVENOUS | Status: AC
  Administered 2018-01-29: 1000 mL via INTRAVENOUS

## 2018-01-29 NOTE — Progress Notes (Signed)
PROGRESS NOTE    Elizabeth Villarreal  NFA:[id number] DOB: [birth year] DOA: 01/28/2018 PCP: System, Pcp Not In   Outpatient Specialists:     Brief Narrative:  Patient with PMH of COPD presents to the ED with a chief complaint of left sided chest/rib pain and SOB that started on Wednesday and worsened today.  She reports severe pain.  She states that it is difficult to catch her breath.  She states that she was diagnosed with pneumonia and COPD at the end of last year.  She states that she has had a bad cough, but denies running a fever.  She reports having a hard time gaining weight.  She denies any lower extremity pain or swelling, but states that it is hard to be up or active for long because of her SOB.  She denies hx of PE/DVT.  She states that today she rolled over in bed and felt something "pop" in her left side/ribs, followed by severe pain.     Assessment & Plan:   Principal Problem:   COPD exacerbation (HCC) Active Problems:   Tobacco abuse   Acute on chronic respiratory failure with hypoxia (HCC)   Protein-calorie malnutrition, severe (HCC)   Acute on chronic respiratory failure with hypoxia due to COPD exacerbation (HCC): -Nebulizers: scheduled Duoneb and prn albuterol -Solu-Medrol 40 mg IV bid -Doxycycline -Flagyl started due to concerning for aspiration - SLP Eval -Mucinex for cough  -Incentive spirometry -Urine S. pneumococcal antigen negative; flu negative -Follow up blood culture x2, sputum culture -Nasal cannula oxygen as neededto maintain O2 saturation 92% or greater- wean as tolerated -prn Norco and flexeril for left flank pain  Tobacco abuse : -Did counseling about importance of quitting smoking -Nicotine patch  Protein-calorie malnutrition, severe (HCC): -nutrition consult  Lung nodule: CAT showed 14 mm RIGHT lower lobe pulmonary nodule with central calcification, which is most compatible with healing granuloma per the radiologist's report -f/u with  PCP      DVT prophylaxis:  Lovenox   Code Status: Full Code   Family Communication:   Disposition Plan:     Consultants:    Subjective: Still with rib pain  Objective: Vitals:   01/29/18 1045 01/29/18 1119 01/29/18 1136 01/29/18 1140  BP:  110/65 96/73   Pulse: 76 78 72 78  Resp: 17 19  17   Temp:      TempSrc:      SpO2: 99% 97% 97% 97%  Weight:      Height:        Intake/Output Summary (Last 24 hours) at 01/29/2018 1241 Last data filed at 01/29/2018 1014 Gross per 24 hour  Intake 2500 ml  Output 800 ml  Net 1700 ml   Filed Weights   01/28/18 2000  Weight: 44 kg (97 lb)    Examination:  General exam: cachectic  Respiratory system: no wheezing, diminished Cardiovascular system: S1 & S2 heard, RRR. No JVD, murmurs, rubs, gallops or clicks. No pedal edema. Gastrointestinal system: Abdomen is nondistended, soft and nontender. No organomegaly or masses felt. Normal bowel sounds heard. Central nervous system: Alert and oriented. No focal neurological deficits. Extremities: Symmetric 5 x 5 power. Skin: No rashes, lesions or ulcers Psychiatry: tearful     Data Reviewed: I have personally reviewed following labs and imaging studies  CBC: Recent Labs  Lab 01/28/18 2009  WBC 12.7*  HGB 13.7  HCT 42.1  MCV 86.4  PLT 408*   Basic Metabolic Panel: Recent Labs  Lab 01/28/18 2009  NA  139  K 4.0  CL 106  CO2 22  GLUCOSE 110*  BUN <5*  CREATININE 0.57  CALCIUM 9.1   GFR: Estimated Creatinine Clearance: 61 mL/min (by C-G formula based on SCr of 0.57 mg/dL). Liver Function Tests: No results for input(s): AST, ALT, ALKPHOS, BILITOT, PROT, ALBUMIN in the last 168 hours. No results for input(s): LIPASE, AMYLASE in the last 168 hours. No results for input(s): AMMONIA in the last 168 hours. Coagulation Profile: No results for input(s): INR, PROTIME in the last 168 hours. Cardiac Enzymes: No results for input(s): CKTOTAL, CKMB, CKMBINDEX,  TROPONINI in the last 168 hours. BNP (last 3 results) No results for input(s): PROBNP in the last 8760 hours. HbA1C: No results for input(s): HGBA1C in the last 72 hours. CBG: No results for input(s): GLUCAP in the last 168 hours. Lipid Profile: No results for input(s): CHOL, HDL, LDLCALC, TRIG, CHOLHDL, LDLDIRECT in the last 72 hours. Thyroid Function Tests: No results for input(s): TSH, T4TOTAL, FREET4, T3FREE, THYROIDAB in the last 72 hours. Anemia Panel: No results for input(s): VITAMINB12, FOLATE, FERRITIN, TIBC, IRON, RETICCTPCT in the last 72 hours. Urine analysis: No results found for: COLORURINE, APPEARANCEUR, LABSPEC, PHURINE, GLUCOSEU, HGBUR, BILIRUBINUR, KETONESUR, PROTEINUR, UROBILINOGEN, NITRITE, LEUKOCYTESUR   )No results found for this or any previous visit (from the past 240 hour(s)).    Anti-infectives (From admission, onward)   Start     Dose/Rate Route Frequency Ordered Stop   01/29/18 0030  doxycycline (VIBRA-TABS) tablet 100 mg     100 mg Oral Every 12 hours 01/29/18 0019     01/29/18 0030  metroNIDAZOLE (FLAGYL) tablet 500 mg     500 mg Oral Every 8 hours 01/29/18 0019         Radiology Studies: Dg Chest 2 View  Result Date: 01/28/2018 CLINICAL DATA:  Rib pain, productive cough EXAM: CHEST  2 VIEW COMPARISON:  10/03/2017 FINDINGS: Severe COPD changes. Heart and mediastinal contours are within normal limits. No focal opacities or effusions. No acute bony abnormality. IMPRESSION: Severe COPD.  No active cardiopulmonary disease. Electronically Signed   By: Charlett Nose M.D.   On: 01/28/2018 20:41   Ct Angio Chest Pe W And/or Wo Contrast  Result Date: 01/28/2018 CLINICAL DATA:  Worsening RIGHT rib pain radiating to LEFT back. Shortness of breath. History of COPD. EXAM: CT ANGIOGRAPHY CHEST WITH CONTRAST TECHNIQUE: Multidetector CT imaging of the chest was performed using the standard protocol during bolus administration of intravenous contrast. Multiplanar CT  image reconstructions and MIPs were obtained to evaluate the vascular anatomy. CONTRAST:  ISOVUE-370 IOPAMIDOL (ISOVUE-370) INJECTION 76% COMPARISON:  Chest radiograph January 28, 2018 FINDINGS: CARDIOVASCULAR: Adequate contrast opacification of the pulmonary artery's. Main pulmonary artery is not enlarged. No pulmonary arterial filling defects to the level of the subsegmental branches. Heart size is normal, no right heart strain. No pericardial effusion. Thoracic aorta is normal course and caliber, unremarkable. MEDIASTINUM/NODES: No lymphadenopathy by CT size criteria. LUNGS/PLEURA: Debris layering within trachea and bilateral mainstem bronchi. Bronchial wall thickening. Severe centrilobular emphysema most apparent within lung apices. No pleural effusion or focal consolidation. Lobulated 15 mm solid nodule superior segment RIGHT lower lobe with calcification (68/157). Lingular scarring. UPPER ABDOMEN: Trace calcific atherosclerosis abdominal aorta. MUSCULOSKELETAL: Nonacute. Patient is cachectic. Centered thoracic kyphosis with upper thoracic mild spondylosis. Review of the MIP images confirms the above findings. IMPRESSION: 1. No acute pulmonary embolism. 2. Debris within trachea and bronchi concerning for aspiration. 3. Severe emphysema. 4. 14 mm RIGHT lower  lobe pulmonary nodule with central calcification most compatible with healing granuloma. No routine indicated follow-up by consensus. Emphysema (ICD10-J43.9). Electronically Signed   By: Awilda Metroourtnay  Bloomer M.D.   On: 01/28/2018 23:23        Scheduled Meds: . doxycycline  100 mg Oral Q12H  . enoxaparin (LOVENOX) injection  30 mg Subcutaneous Q24H  . ferrous sulfate  325 mg Oral Once per day on Mon Thu  . ipratropium-albuterol  3 mL Nebulization Q4H  . methylPREDNISolone (SOLU-MEDROL) injection  40 mg Intravenous Q12H  . metroNIDAZOLE  500 mg Oral Q8H  . multivitamin with minerals  1 tablet Oral Daily  . nicotine  21 mg Transdermal Daily   . vitamin B-12  100 mcg Oral Daily   Continuous Infusions: . sodium chloride 75 mL/hr at 01/29/18 1114     LOS: 0 days    Time spent: 35 min    Joseph ArtJessica U Ellagrace Yoshida, DO [facility] Pager [phone]  If 7PM-7AM, please contact night-coverage [email or web address] Password [email or web address] 01/29/2018, 12:41 PM

## 2018-01-29 NOTE — Plan of Care (Signed)
Diwscussed plan of care, pain management, spliting when coughing and need for a sputum sample with some teach back displayed.  Patient is very concerned about being able to afford medications with no income at this time,

## 2018-01-29 NOTE — ED Notes (Signed)
Regular Diet ordered for Dinner. 

## 2018-01-29 NOTE — ED Notes (Signed)
Pt care assumed, obtained verbal report.  Pt is A&O x 4.  Reports L side pain.  Ambulated to the BR with steady gait.  Ate about 15% of her lunch tray.

## 2018-01-29 NOTE — ED Notes (Signed)
Placed on hospital bed for comfort.  Bed in lowest position with call bell within reach.  Encouraged to call for assistance as needed. 

## 2018-01-29 NOTE — ED Notes (Signed)
Dr. Benjamine MolaVann contacted and states pt may eat.

## 2018-01-29 NOTE — ED Notes (Signed)
Heart Healthy Lunch Tray Ordered @ 1139-per Hayley, RN-called by Marylene LandAngela

## 2018-01-29 NOTE — ED Notes (Signed)
Spoke with [facility] [employer] about lunch try and said they received it earlier, will reprint the order and send it up

## 2018-01-29 NOTE — ED Notes (Signed)
Dr Clyde LundborgNiu made aware of low blood pressure.  Placing orders at this time.

## 2018-01-30 DIAGNOSIS — J9601 Acute respiratory failure with hypoxia: Secondary | ICD-10-CM

## 2018-01-30 LAB — BLOOD CULTURE ID PANEL (REFLEXED)
ACINETOBACTER BAUMANNII: NOT DETECTED
CANDIDA ALBICANS: NOT DETECTED
CANDIDA KRUSEI: NOT DETECTED
CANDIDA PARAPSILOSIS: NOT DETECTED
Candida glabrata: NOT DETECTED
Candida tropicalis: NOT DETECTED
ENTEROBACTERIACEAE SPECIES: NOT DETECTED
ESCHERICHIA COLI: NOT DETECTED
Enterobacter cloacae complex: NOT DETECTED
Enterococcus species: NOT DETECTED
HAEMOPHILUS INFLUENZAE: NOT DETECTED
KLEBSIELLA OXYTOCA: NOT DETECTED
Klebsiella pneumoniae: NOT DETECTED
Listeria monocytogenes: NOT DETECTED
METHICILLIN RESISTANCE: NOT DETECTED
Neisseria meningitidis: NOT DETECTED
PSEUDOMONAS AERUGINOSA: NOT DETECTED
Proteus species: NOT DETECTED
STREPTOCOCCUS AGALACTIAE: NOT DETECTED
STREPTOCOCCUS PNEUMONIAE: NOT DETECTED
STREPTOCOCCUS PYOGENES: NOT DETECTED
Serratia marcescens: NOT DETECTED
Staphylococcus aureus (BCID): NOT DETECTED
Staphylococcus species: DETECTED — AB
Streptococcus species: NOT DETECTED

## 2018-01-30 MED ORDER — ENSURE ENLIVE PO LIQD
237.0000 mL | Freq: Two times a day (BID) | ORAL | Status: DC
  Administered 2018-01-30 – 2018-01-31 (×3): 237 mL via ORAL

## 2018-01-30 NOTE — Evaluation (Signed)
Clinical/Bedside Swallow Evaluation Patient Details  Name: Annye AsaMary Trice MRN: [id number] Date of Birth: 08-Oct-1971  Today's Date: 01/30/2018 Time: SLP Start Time (ACUTE ONLY): 0825 SLP Stop Time (ACUTE ONLY): 0844 SLP Time Calculation (min) (ACUTE ONLY): 19 min  Past Medical History:  Past Medical History:  Diagnosis Date  . COPD (chronic obstructive pulmonary disease) (HCC)   . Tobacco abuse    Past Surgical History:  Past Surgical History:  Procedure Laterality Date  . CESAREAN SECTION    . ovary cyst rupture     HPI:  Patient with PMH of COPD presents to the ED with a chief complaint of left sided chest/rib pain and SOB that started on Wednesday and worsened on Sunday 2/24   Assessment / Plan / Recommendation Clinical Impression   Pt presents with grossly intact oropharyngeal swallowing function at bedside.  Pt had no overt s/s of aspiration with solids or liquids and pt was able to clear solids from the oral cavity post swallow without difficulty.  Pt is at an increased risk of aspiration given her history of COPD and its impact on the reciprocal relationship between swallowing and breathing.  She reports that she does "get choked" somewhat frequently at home which SLP suspects corresponds with changes in respiratory function.  Discussed swallowing strategies to maximize safety, specifically emphasizing cessation of eating when she notes increased work of breathing or fatigue.  Pt verbalized understanding.  No further ST follow up indicated at this time.   SLP Visit Diagnosis: Dysphagia, unspecified (R13.10)    Aspiration Risk  Mild aspiration risk    Diet Recommendation Regular;Thin liquid   Liquid Administration via: Cup;Straw Medication Administration: Whole meds with liquid Supervision: Patient able to self feed Compensations: Minimize environmental distractions;Slow rate;Small sips/bites;Follow solids with liquid Postural Changes: Seated upright at 90 degrees    Other   Recommendations Oral Care Recommendations: Oral care BID   Follow up Recommendations None        Swallow Study   General HPI: Patient with PMH of COPD presents to the ED with a chief complaint of left sided chest/rib pain and SOB that started on Wednesday and worsened on Sunday 2/24 Type of Study: Bedside Swallow Evaluation Previous Swallow Assessment: none on record Diet Prior to this Study: Regular;Thin liquids Temperature Spikes Noted: No Respiratory Status: Nasal cannula History of Recent Intubation: No Behavior/Cognition: Alert;Cooperative Oral Cavity Assessment: Within Functional Limits Oral Cavity - Dentition: Dentures, top;Dentures, bottom Vision: Functional for self-feeding Self-Feeding Abilities: Able to feed self Patient Positioning: Upright in bed Baseline Vocal Quality: Normal Volitional Cough: Congested    Oral/Motor/Sensory Function Overall Oral Motor/Sensory Function: Within functional limits   Ice Chips     Thin Liquid Thin Liquid: Within functional limits    Nectar Thick     Honey Thick     Puree     Solid   GO   Solid: Within functional limits        Ikechukwu Cerny, Joni ReiningNicole L 01/30/2018,8:50 AM

## 2018-01-30 NOTE — Progress Notes (Signed)
PROGRESS NOTE    Elizabeth Villarreal  ZOX:[id number] DOB: [birth year] DOA: 01/28/2018 PCP: System, Pcp Not In   Outpatient Specialists:     Brief Narrative:  Patient with PMH of COPD presents to the ED with a chief complaint of left sided chest/rib pain and SOB that started on Wednesday and worsened today.  She reports severe pain.  She states that it is difficult to catch her breath.  She states that she was diagnosed with pneumonia and COPD at the end of last year.  She states that she has had a bad cough, but denies running a fever.  She reports having a hard time gaining weight.  She denies any lower extremity pain or swelling, but states that it is hard to be up or active for long because of her SOB.  She denies hx of PE/DVT.  She states that today she rolled over in bed and felt something "pop" in her left side/ribs, followed by severe pain.     Assessment & Plan:   Principal Problem:   COPD exacerbation (HCC) Active Problems:   Tobacco abuse   Acute on chronic respiratory failure with hypoxia (HCC)   Protein-calorie malnutrition, severe (HCC)   Acute on chronic respiratory failure with hypoxia due to COPD exacerbation (HCC): -Nebulizers: scheduled Duoneb and prn albuterol -Solu-Medrol 40 mg IV bid -Doxycycline -Flagyl started due to concerning for aspiration - SLP Eval -Mucinex for cough  -Incentive spirometry/flutter valve -Urine S. pneumococcal antigen negative; flu negative -Follow up blood culture x2, sputum culture (1/3 blood cultures appear to have a contaminant) - wean O2 as tolerated -prn Norco and flexeril for left flank pain  Tobacco abuse : -Did counseling about importance of quitting smoking -Nicotine patch  Protein-calorie malnutrition, severe (HCC): -nutrition consult  Lung nodule: CAT showed 14 mm RIGHT lower lobe pulmonary nodule with central calcification, which is most compatible with healing granuloma per the radiologist's report -f/u with  PCP      DVT prophylaxis:  Lovenox   Code Status: Full Code   Family Communication:   Disposition Plan:  Needs PCP/medications and pulm follow up   Consultants:    Subjective: Breathing better-- still worried about insurance and follow up  Objective: Vitals:   01/29/18 1800 01/29/18 1912 01/29/18 1933 01/29/18 2347  BP: (!) 102/56 113/77  110/68  Pulse: 85 96  90  Resp: 20 18  20   Temp:  98.8 F (37.1 C)  98.2 F (36.8 C)  TempSrc:  Oral  Oral  SpO2: 95% 98% 98%   Weight:  50.2 kg (110 lb 10.7 oz)    Height:  5' 8.5" (1.74 m)      Intake/Output Summary (Last 24 hours) at 01/30/2018 8119 Last data filed at 01/30/2018 0000 Gross per 24 hour  Intake 1527.5 ml  Output 800 ml  Net 727.5 ml   Filed Weights   01/28/18 2000 01/29/18 1912  Weight: 44 kg (97 lb) 50.2 kg (110 lb 10.7 oz)    Examination:  General exam: thin female Respiratory system: diminished, no increased work of breathing-- still on 2L O2 Cardiovascular system: rrr Gastrointestinal system: +BS, soft Central nervous system: alert and oriented Extremities: moves all 4 ext Psychiatry: tearful when talking about insurance situation    Data Reviewed: I have personally reviewed following labs and imaging studies  CBC: Recent Labs  Lab 01/28/18 2009  WBC 12.7*  HGB 13.7  HCT 42.1  MCV 86.4  PLT 408*   Basic Metabolic Panel: Recent Labs  Lab 01/28/18 2009  NA 139  K 4.0  CL 106  CO2 22  GLUCOSE 110*  BUN <5*  CREATININE 0.57  CALCIUM 9.1   GFR: Estimated Creatinine Clearance: 69.6 mL/min (by C-G formula based on SCr of 0.57 mg/dL). Liver Function Tests: No results for input(s): AST, ALT, ALKPHOS, BILITOT, PROT, ALBUMIN in the last 168 hours. No results for input(s): LIPASE, AMYLASE in the last 168 hours. No results for input(s): AMMONIA in the last 168 hours. Coagulation Profile: No results for input(s): INR, PROTIME in the last 168 hours. Cardiac Enzymes: No results  for input(s): CKTOTAL, CKMB, CKMBINDEX, TROPONINI in the last 168 hours. BNP (last 3 results) No results for input(s): PROBNP in the last 8760 hours. HbA1C: No results for input(s): HGBA1C in the last 72 hours. CBG: No results for input(s): GLUCAP in the last 168 hours. Lipid Profile: No results for input(s): CHOL, HDL, LDLCALC, TRIG, CHOLHDL, LDLDIRECT in the last 72 hours. Thyroid Function Tests: No results for input(s): TSH, T4TOTAL, FREET4, T3FREE, THYROIDAB in the last 72 hours. Anemia Panel: No results for input(s): VITAMINB12, FOLATE, FERRITIN, TIBC, IRON, RETICCTPCT in the last 72 hours. Urine analysis: No results found for: COLORURINE, APPEARANCEUR, LABSPEC, PHURINE, GLUCOSEU, HGBUR, BILIRUBINUR, KETONESUR, PROTEINUR, UROBILINOGEN, NITRITE, LEUKOCYTESUR   ) Recent Results (from the past 240 hour(s))  Culture, blood (routine x 2) Call MD if unable to obtain prior to antibiotics being given     Status: None (Preliminary result)   Collection Time: 01/29/18  1:01 AM  Result Value Ref Range Status   Specimen Description BLOOD RIGHT HAND  Final   Special Requests IN PEDIATRIC BOTTLE Blood Culture adequate volume  Final   Culture  Setup Time   Final    GRAM POSITIVE COCCI IN PEDIATRIC BOTTLE Organism ID to follow CRITICAL RESULT CALLED TO, READ BACK BY AND VERIFIED WITHMerlene Morse: [facility] 01/30/18 0303 JDW Performed at [facility], [address] [place], [place] [address]    Culture PENDING  Incomplete   Report Status PENDING  Incomplete  Blood Culture ID Panel (Reflexed)     Status: Abnormal   Collection Time: 01/29/18  1:01 AM  Result Value Ref Range Status   Enterococcus species NOT DETECTED NOT DETECTED Final   Listeria monocytogenes NOT DETECTED NOT DETECTED Final   Staphylococcus species DETECTED (A) NOT DETECTED Final    Comment: Methicillin (oxacillin) susceptible coagulase negative staphylococcus. Possible blood culture contaminant (unless isolated from more than  one blood culture draw or clinical case suggests pathogenicity). No antibiotic treatment is indicated for blood  culture contaminants. CRITICAL RESULT CALLED TO, READ BACK BY AND VERIFIED WITH: V BRYK PHARMD 01/30/18 0303 JDW    Staphylococcus aureus NOT DETECTED NOT DETECTED Final   Methicillin resistance NOT DETECTED NOT DETECTED Final   Streptococcus species NOT DETECTED NOT DETECTED Final   Streptococcus agalactiae NOT DETECTED NOT DETECTED Final   Streptococcus pneumoniae NOT DETECTED NOT DETECTED Final   Streptococcus pyogenes NOT DETECTED NOT DETECTED Final   Acinetobacter baumannii NOT DETECTED NOT DETECTED Final   Enterobacteriaceae species NOT DETECTED NOT DETECTED Final   Enterobacter cloacae complex NOT DETECTED NOT DETECTED Final   Escherichia coli NOT DETECTED NOT DETECTED Final   Klebsiella oxytoca NOT DETECTED NOT DETECTED Final   Klebsiella pneumoniae NOT DETECTED NOT DETECTED Final   Proteus species NOT DETECTED NOT DETECTED Final   Serratia marcescens NOT DETECTED NOT DETECTED Final   Haemophilus influenzae NOT DETECTED NOT DETECTED Final  Neisseria meningitidis NOT DETECTED NOT DETECTED Final   Pseudomonas aeruginosa NOT DETECTED NOT DETECTED Final   Candida albicans NOT DETECTED NOT DETECTED Final   Candida glabrata NOT DETECTED NOT DETECTED Final   Candida krusei NOT DETECTED NOT DETECTED Final   Candida parapsilosis NOT DETECTED NOT DETECTED Final   Candida tropicalis NOT DETECTED NOT DETECTED Final    Comment: Performed at [facility], [address], [place], [place] [address]  Culture, sputum-assessment     Status: None (Preliminary result)   Collection Time: 01/29/18  9:36 PM  Result Value Ref Range Status   Specimen Description EXPECTORATED SPUTUM  Final   Special Requests NONE  Final   Sputum evaluation   Final    THIS SPECIMEN IS ACCEPTABLE FOR SPUTUM CULTURE Performed at [facility], [address], [place], [place] [address]    Report  Status PENDING  Incomplete  Culture, respiratory (NON-Expectorated)     Status: None (Preliminary result)   Collection Time: 01/29/18  9:36 PM  Result Value Ref Range Status   Specimen Description EXPECTORATED SPUTUM  Final   Special Requests NONE Reflexed from [id number]  Final   Gram Stain   Final    ABUNDANT WBC PRESENT, PREDOMINANTLY PMN ABUNDANT GRAM POSITIVE COCCI MODERATE GRAM NEGATIVE COCCOBACILLI Performed at [facility], [address] [place], [place] [address]    Culture PENDING  Incomplete   Report Status PENDING  Incomplete      Anti-infectives (From admission, onward)   Start     Dose/Rate Route Frequency Ordered Stop   01/29/18 2100  metroNIDAZOLE (FLAGYL) tablet 500 mg     500 mg Oral Every 8 hours 01/29/18 2039     01/29/18 0030  doxycycline (VIBRA-TABS) tablet 100 mg     100 mg Oral Every 12 hours 01/29/18 0019     01/29/18 0030  metroNIDAZOLE (FLAGYL) tablet 500 mg  Status:  Discontinued     500 mg Oral Every 8 hours 01/29/18 0019 01/29/18 2039       Radiology Studies: Dg Chest 2 View  Result Date: 01/28/2018 CLINICAL DATA:  Rib pain, productive cough EXAM: CHEST  2 VIEW COMPARISON:  10/03/2017 FINDINGS: Severe COPD changes. Heart and mediastinal contours are within normal limits. No focal opacities or effusions. No acute bony abnormality. IMPRESSION: Severe COPD.  No active cardiopulmonary disease. Electronically Signed   By: Charlett Nose M.D.   On: 01/28/2018 20:41   Ct Angio Chest Pe W And/or Wo Contrast  Result Date: 01/28/2018 CLINICAL DATA:  Worsening RIGHT rib pain radiating to LEFT back. Shortness of breath. History of COPD. EXAM: CT ANGIOGRAPHY CHEST WITH CONTRAST TECHNIQUE: Multidetector CT imaging of the chest was performed using the standard protocol during bolus administration of intravenous contrast. Multiplanar CT image reconstructions and MIPs were obtained to evaluate the vascular anatomy. CONTRAST:  ISOVUE-370 IOPAMIDOL (ISOVUE-370)  INJECTION 76% COMPARISON:  Chest radiograph January 28, 2018 FINDINGS: CARDIOVASCULAR: Adequate contrast opacification of the pulmonary artery's. Main pulmonary artery is not enlarged. No pulmonary arterial filling defects to the level of the subsegmental branches. Heart size is normal, no right heart strain. No pericardial effusion. Thoracic aorta is normal course and caliber, unremarkable. MEDIASTINUM/NODES: No lymphadenopathy by CT size criteria. LUNGS/PLEURA: Debris layering within trachea and bilateral mainstem bronchi. Bronchial wall thickening. Severe centrilobular emphysema most apparent within lung apices. No pleural effusion or focal consolidation. Lobulated 15 mm solid nodule superior segment RIGHT lower lobe with calcification (68/157). Lingular  scarring. UPPER ABDOMEN: Trace calcific atherosclerosis abdominal aorta. MUSCULOSKELETAL: Nonacute. Patient is cachectic. Centered thoracic kyphosis with upper thoracic mild spondylosis. Review of the MIP images confirms the above findings. IMPRESSION: 1. No acute pulmonary embolism. 2. Debris within trachea and bronchi concerning for aspiration. 3. Severe emphysema. 4. 14 mm RIGHT lower lobe pulmonary nodule with central calcification most compatible with healing granuloma. No routine indicated follow-up by consensus. Emphysema (ICD10-J43.9). Electronically Signed   By: Awilda Metro M.D.   On: 01/28/2018 23:23        Scheduled Meds: . doxycycline  100 mg Oral Q12H  . enoxaparin (LOVENOX) injection  30 mg Subcutaneous Q24H  . ferrous sulfate  325 mg Oral Once per day on Mon Thu  . Influenza vac split quadrivalent PF  0.5 mL Intramuscular Tomorrow-1000  . ipratropium-albuterol  3 mL Nebulization QID  . methylPREDNISolone (SOLU-MEDROL) injection  40 mg Intravenous Q12H  . metroNIDAZOLE  500 mg Oral Q8H  . multivitamin with minerals  1 tablet Oral Daily  . nicotine  21 mg Transdermal Daily  . pneumococcal 23 valent vaccine  0.5 mL  Intramuscular Tomorrow-1000  . vitamin B-12  100 mcg Oral Daily   Continuous Infusions:    LOS: 1 day    Time spent: 25 min    Joseph Art, DO [facility] Pager [phone]  If 7PM-7AM, please contact night-coverage [email or web address] Password [facility] 01/30/2018, 8:28 AM

## 2018-01-30 NOTE — Progress Notes (Signed)
PHARMACY - PHYSICIAN COMMUNICATION CRITICAL VALUE ALERT - BLOOD CULTURE IDENTIFICATION (BCID)  Elizabeth Villarreal is an 47 y.o. female who [facility] with a chief complaint of COPD exacerbation.   Name of provider contacted: KSchorr via text page  Current antibiotics: doxycycline and metronidazole  Changes to prescribed antibiotics recommended:  CNS in 1/3 bottles, likely contaminant, no changes needed.  Results for orders placed or performed during the hospital encounter of 01/28/18  Blood Culture ID Panel (Reflexed) (Collected: 01/29/2018  1:01 AM)  Result Value Ref Range   Enterococcus species NOT DETECTED NOT DETECTED   Listeria monocytogenes NOT DETECTED NOT DETECTED   Staphylococcus species DETECTED (A) NOT DETECTED   Staphylococcus aureus NOT DETECTED NOT DETECTED   Methicillin resistance NOT DETECTED NOT DETECTED   Streptococcus species NOT DETECTED NOT DETECTED   Streptococcus agalactiae NOT DETECTED NOT DETECTED   Streptococcus pneumoniae NOT DETECTED NOT DETECTED   Streptococcus pyogenes NOT DETECTED NOT DETECTED   Acinetobacter baumannii NOT DETECTED NOT DETECTED   Enterobacteriaceae species NOT DETECTED NOT DETECTED   Enterobacter cloacae complex NOT DETECTED NOT DETECTED   Escherichia coli NOT DETECTED NOT DETECTED   Klebsiella oxytoca NOT DETECTED NOT DETECTED   Klebsiella pneumoniae NOT DETECTED NOT DETECTED   Proteus species NOT DETECTED NOT DETECTED   Serratia marcescens NOT DETECTED NOT DETECTED   Haemophilus influenzae NOT DETECTED NOT DETECTED   Neisseria meningitidis NOT DETECTED NOT DETECTED   Pseudomonas aeruginosa NOT DETECTED NOT DETECTED   Candida albicans NOT DETECTED NOT DETECTED   Candida glabrata NOT DETECTED NOT DETECTED   Candida krusei NOT DETECTED NOT DETECTED   Candida parapsilosis NOT DETECTED NOT DETECTED   Candida tropicalis NOT DETECTED NOT DETECTED    Vernard GamblesVeronda Yzabella Crunk, PharmD, BCPS  01/30/2018  3:10 AM

## 2018-01-30 NOTE — Progress Notes (Signed)
Initial Nutrition Assessment  DOCUMENTATION CODES:   Severe malnutrition in context of chronic illness, Underweight  INTERVENTION:   RD to order Ensure Enlive po BID, each supplement provides 350kcals and 20g of protein.  NUTRITION DIAGNOSIS:   Severe Malnutrition related to chronic illness(COPD) as evidenced by energy intake < 75% for > or equal to 1 month, severe fat depletion, severe muscle depletion.  GOAL:   Patient will meet greater than or equal to 90% of their needs  MONITOR:   PO intake, Weight trends, Supplement acceptance, I & O's  REASON FOR ASSESSMENT:   Consult Assessment of nutrition requirement/status  ASSESSMENT:   47 y.o. female with PMH of tobacco abuse, COPD, recent hx of PNA, who presents with cough, shortness breath, left flank pain.  Pt reports a decent appetite and eating 100% meals this admission. She denies any N/V/D or constipation.   Pt reports a UBW of 94lb for the past year. She reports she has been struggling to maintain and/or gain weight for the past year. Per chart review, pt has gained wt (outlined below) but nonetheless pt's BMI indicates underweight status.   Pt reports eating mainly one meal a day at home. Pt states she is "not good at recognizing when she is hungry." She often will skip breakfast and lunch during the day when she is home alone, but eats dinner with her family most nights. Pt reports consuming carnation instant breakfast in the past to help gain weight but has not purchased it recently due to cost. Pt usually takes an MVI at home and occasionally takes iron tablets.  Pt expresses financial barriers to food access. Intern spoke with case Production designer, theatre/television/filmmanager; Social work to be consulted.  Pt was tearful at times while describing recent health disparities, stressful family dynamics, recently losing her job, and concerns for her health/weight and finances.   Pt reports having poorly fitting dentures; she states this prevents her from  taking large bites of food at once. Pt also reports feelings of early satiety related to COPD.  Intern explained how COPD increases her calorie and protein needs and discussed ways to increase food intake to gain/maintain wt. Recommended scheduling 5-6 small meal times per day and trying to consume something high in calories and protein at each mini meal. Intern recommended high calorie and high protein foods such as whole milk & yogurt, peanut butter, and canned tuna. Also recommended adding in butter, regular mayonnaise, and oils to increase caloric intake. Discussed adding in carnation instant breakfast, store brand versions of Ensure, or protein powder if she cannot maintain her wt with the aforementioned recommendations.   Medications: Doxycyclin, iron (Monday and Thursday), solumedrol, flagyl, MVI, B12 tablet.  Labs: 2/24 - BUN (L; <5).  NUTRITION - FOCUSED PHYSICAL EXAM:    Most Recent Value  Orbital Region  Mild depletion  Upper Arm Region  Moderate depletion  Thoracic and Lumbar Region  Severe depletion  Buccal Region  Mild depletion  Temple Region  Moderate depletion  Clavicle Bone Region  Moderate depletion  Clavicle and Acromion Bone Region  Severe depletion  Scapular Bone Region  Severe depletion  Dorsal Hand  Moderate depletion  Patellar Region  Moderate depletion  Anterior Thigh Region  Severe depletion  Posterior Calf Region  Moderate depletion  Edema (RD Assessment)  None  Hair  Reviewed     Pt reports her hair has been gradually thinning over the past few years but this may be stress related.  Diet Order:  Diet  regular Room service appropriate? Yes; Fluid consistency: Thin  EDUCATION NEEDS:   Education needs have been addressed  Skin:  Skin Assessment: Reviewed RN Assessment  Last BM:  PTA  Height:   Ht Readings from Last 1 Encounters:  01/29/18 5' 8.5" (1.74 m)    Weight:   Filed Weights   01/28/18 2000 01/29/18 1912  Weight: 97 lb (44 kg) 110 lb  10.7 oz (50.2 kg)    Ideal Body Weight:  64.8 kg  BMI:  Body mass index is 16.58 kg/m.  Estimated Nutritional Needs:   Kcal:  1800-2000  Protein:  85-95g  Fluid:  >1.8L   Bintou Lafata, MS, Dietetic Intern Pager # [phone]

## 2018-01-31 DIAGNOSIS — R0902 Hypoxemia: Secondary | ICD-10-CM

## 2018-01-31 LAB — BASIC METABOLIC PANEL
Anion gap: 9 (ref 5–15)
BUN: 9 mg/dL (ref 6–20)
CALCIUM: 8.5 mg/dL — AB (ref 8.9–10.3)
CO2: 20 mmol/L — AB (ref 22–32)
CREATININE: 0.53 mg/dL (ref 0.44–1.00)
Chloride: 109 mmol/L (ref 101–111)
GFR calc non Af Amer: >60 mL/min (ref >60)
Glucose, Bld: 148 mg/dL — ABNORMAL HIGH (ref 65–99)
Potassium: 4.5 mmol/L (ref 3.5–5.1)
SODIUM: 138 mmol/L (ref 135–145)

## 2018-01-31 LAB — CBC
HCT: 32.3 % — ABNORMAL LOW (ref 36.0–46.0)
Hemoglobin: 10.3 g/dL — ABNORMAL LOW (ref 12.0–15.0)
MCH: 28 pg (ref 26.0–34.0)
MCHC: 31.9 g/dL (ref 30.0–36.0)
MCV: 87.8 fL (ref 78.0–100.0)
Platelets: 287 10*3/uL (ref 150–400)
RBC: 3.68 MIL/uL — ABNORMAL LOW (ref 3.87–5.11)
RDW: 21.3 % — AB (ref 11.5–15.5)
WBC: 9.4 10*3/uL (ref 4.0–10.5)

## 2018-01-31 LAB — CULTURE, BLOOD (ROUTINE X 2): Special Requests: ADEQUATE

## 2018-01-31 LAB — EXPECTORATED SPUTUM ASSESSMENT W GRAM STAIN, RFLX TO RESP C

## 2018-01-31 MED ORDER — IPRATROPIUM BROMIDE 0.02 % IN SOLN: 0.5000 mg | Freq: Four times a day (QID) | RESPIRATORY_TRACT | 12 refills | Status: DC

## 2018-01-31 MED ORDER — CYCLOBENZAPRINE HCL 5 MG PO TABS: 5.0000 mg | ORAL_TABLET | Freq: Three times a day (TID) | ORAL | 0 refills | Status: DC | PRN

## 2018-01-31 MED ORDER — PREDNISONE 20 MG PO TABS
50.0000 mg | ORAL_TABLET | Freq: Every day | ORAL | Status: DC
  Administered 2018-01-31: 50 mg via ORAL
  Filled 2018-01-31: qty 2

## 2018-01-31 MED ORDER — IPRATROPIUM-ALBUTEROL 0.5-2.5 (3) MG/3ML IN SOLN: 3.0000 mL | Freq: Three times a day (TID) | RESPIRATORY_TRACT | Status: DC

## 2018-01-31 MED ORDER — ENSURE ENLIVE PO LIQD: 237.0000 mL | Freq: Two times a day (BID) | ORAL | 12 refills | Status: DC

## 2018-01-31 MED ORDER — NICOTINE 21 MG/24HR TD PT24: 21.0000 mg | MEDICATED_PATCH | Freq: Every day | TRANSDERMAL | 0 refills | Status: DC

## 2018-01-31 MED ORDER — HYDROCODONE-ACETAMINOPHEN 5-325 MG PO TABS: 1.0000 | ORAL_TABLET | Freq: Four times a day (QID) | ORAL | 0 refills | Status: DC | PRN

## 2018-01-31 MED ORDER — ALBUTEROL SULFATE (2.5 MG/3ML) 0.083% IN NEBU: 2.5000 mg | INHALATION_SOLUTION | Freq: Four times a day (QID) | RESPIRATORY_TRACT | 3 refills | Status: DC

## 2018-01-31 MED ORDER — PANTOPRAZOLE SODIUM 40 MG PO TBEC: 40.0000 mg | DELAYED_RELEASE_TABLET | Freq: Every day | ORAL | 0 refills | Status: DC

## 2018-01-31 MED ORDER — PREDNISONE 10 MG PO TABS: ORAL_TABLET | ORAL | 0 refills | Status: DC

## 2018-01-31 MED ORDER — DOXYCYCLINE HYCLATE 100 MG PO TABS: 100.0000 mg | ORAL_TABLET | Freq: Two times a day (BID) | ORAL | 0 refills | Status: DC

## 2018-01-31 MED FILL — ?IPRATROPIUM BR 0.02% SOLN: 0.02 | 7 days supply | Qty: 75 | Fill #0

## 2018-01-31 MED FILL — predniSONE 10 MG TABS: 10 | 10 days supply | Qty: 30 | Fill #0

## 2018-01-31 MED FILL — ?PANTOPRAZOLE SOD DR 40MG T: 40 | 30 days supply | Qty: 30 | Fill #0

## 2018-01-31 MED FILL — ALBUTEROL SUL 2.5 MG/3 ML S: (2.5 MG/3ML | 6 days supply | Qty: 75 | Fill #0

## 2018-01-31 MED FILL — ?CYCLOBENZAPRINE 5MG TABLET: 5 | 5 days supply | Qty: 15 | Fill #0

## 2018-01-31 NOTE — Discharge Summary (Signed)
Physician Discharge Summary  Elizabeth Villarreal WUJ:[id number] DOB: [birth year] DOA: 01/28/2018  PCP: System, Pcp Not In  Admit date: 01/28/2018 Discharge date: 01/31/2018   Recommendations for Outpatient Follow-Up:   Needs referral for PFTs and pulm follow up Tobacco cessation  Discharge Diagnosis:   Principal Problem:   COPD exacerbation (HCC) Active Problems:   Tobacco abuse   Acute on chronic respiratory failure with hypoxia (HCC)   Protein-calorie malnutrition, severe (HCC)   Discharge disposition:  Home  Discharge Condition: Improved.  Diet recommendation:  Regular.  Wound care: None.   History of Present Illness:   Elizabeth Villarreal is a 47 y.o. female with medical history significant of tobacco abuse, COPD, who presents with cough, shortness breath, left flank pain.  Patient states that she has been having cough and shortness of breath in the past 4 days, which has been progressively getting worse. She coughs up greenish colored sputum. She also reports left flank pain, which is constant, sharp, 7 out of 10 in severity, aggravated by deep breath and coughing. No tenderness in the calf areas. She states that she was diagnosed with pneumonia and COPD at the end of last year. Patient does not have runny nose, sore throat. No fever or chills. Patient states that she has always been having poor appetite and having a hard time gaining weight. Patient speaks in full sentence.     Hospital Course by Problem:   Acute on chronic respiratory failure with hypoxiadue toCOPD exacerbation (HCC): -Nebulizers: scheduled Duoneb and prn albuterol-- $4 at [facility] -PO steroid taper -Doxycycline - SLP Eval: normal diet -Mucinex for cough  -UrineS. pneumococcal antigen negative; flu negative -prn Norco and flexeril for left flank pain Home O2 eval showed  Tobacco abuse : -Did counseling about importance of quitting smoking -Nicotine patch  Protein-calorie malnutrition, severe  (HCC): -encouraged PO intake  Lung nodule:CAT showed 14 mm RIGHT lower lobe pulmonary nodule with centralcalcification, which ismost compatible with healing granulomaperthe radiologist's report -f/u with PCP      Medical Consultants:    None.   Discharge Exam:   Vitals:   01/31/18 1128 01/31/18 1142  BP: 107/65   Pulse: (!) 114   Resp:    Temp: 97.6 F (36.4 C)   SpO2: 95% 95%   Vitals:   01/31/18 0826 01/31/18 1125 01/31/18 1128 01/31/18 1142  BP: (!) 96/53  107/65   Pulse: 97  (!) 114   Resp: 18     Temp: 98.8 F (37.1 C)  97.6 F (36.4 C)   TempSrc: Axillary  Oral   SpO2: 96% 93% 95% 95%  Weight:      Height:        Gen:  NAD    The results of significant diagnostics from this hospitalization (including imaging, microbiology, ancillary and laboratory) are listed below for reference.     Procedures and Diagnostic Studies:   Dg Chest 2 View  Result Date: 01/28/2018 CLINICAL DATA:  Rib pain, productive cough EXAM: CHEST  2 VIEW COMPARISON:  10/03/2017 FINDINGS: Severe COPD changes. Heart and mediastinal contours are within normal limits. No focal opacities or effusions. No acute bony abnormality. IMPRESSION: Severe COPD.  No active cardiopulmonary disease. Electronically Signed   By: Charlett Nose M.D.   On: 01/28/2018 20:41   Ct Angio Chest Pe W And/or Wo Contrast  Result Date: 01/28/2018 CLINICAL DATA:  Worsening RIGHT rib pain radiating to LEFT back. Shortness of breath. History of COPD. EXAM: CT ANGIOGRAPHY CHEST WITH CONTRAST  TECHNIQUE: Multidetector CT imaging of the chest was performed using the standard protocol during bolus administration of intravenous contrast. Multiplanar CT image reconstructions and MIPs were obtained to evaluate the vascular anatomy. CONTRAST:  ISOVUE-370 IOPAMIDOL (ISOVUE-370) INJECTION 76% COMPARISON:  Chest radiograph January 28, 2018 FINDINGS: CARDIOVASCULAR: Adequate contrast opacification of the pulmonary  artery's. Main pulmonary artery is not enlarged. No pulmonary arterial filling defects to the level of the subsegmental branches. Heart size is normal, no right heart strain. No pericardial effusion. Thoracic aorta is normal course and caliber, unremarkable. MEDIASTINUM/NODES: No lymphadenopathy by CT size criteria. LUNGS/PLEURA: Debris layering within trachea and bilateral mainstem bronchi. Bronchial wall thickening. Severe centrilobular emphysema most apparent within lung apices. No pleural effusion or focal consolidation. Lobulated 15 mm solid nodule superior segment RIGHT lower lobe with calcification (68/157). Lingular scarring. UPPER ABDOMEN: Trace calcific atherosclerosis abdominal aorta. MUSCULOSKELETAL: Nonacute. Patient is cachectic. Centered thoracic kyphosis with upper thoracic mild spondylosis. Review of the MIP images confirms the above findings. IMPRESSION: 1. No acute pulmonary embolism. 2. Debris within trachea and bronchi concerning for aspiration. 3. Severe emphysema. 4. 14 mm RIGHT lower lobe pulmonary nodule with central calcification most compatible with healing granuloma. No routine indicated follow-up by consensus. Emphysema (ICD10-J43.9). Electronically Signed   By: Awilda Metro M.D.   On: 01/28/2018 23:23     Labs:   Basic Metabolic Panel: Recent Labs  Lab 01/28/18 2009 01/31/18 0257  NA 139 138  K 4.0 4.5  CL 106 109  CO2 22 20*  GLUCOSE 110* 148*  BUN <5* 9  CREATININE 0.57 0.53  CALCIUM 9.1 8.5*   GFR Estimated Creatinine Clearance: 69.6 mL/min (by C-G formula based on SCr of 0.53 mg/dL). Liver Function Tests: No results for input(s): AST, ALT, ALKPHOS, BILITOT, PROT, ALBUMIN in the last 168 hours. No results for input(s): LIPASE, AMYLASE in the last 168 hours. No results for input(s): AMMONIA in the last 168 hours. Coagulation profile No results for input(s): INR, PROTIME in the last 168 hours.  CBC: Recent Labs  Lab 01/28/18 2009 01/31/18 0257   WBC 12.7* 9.4  HGB 13.7 10.3*  HCT 42.1 32.3*  MCV 86.4 87.8  PLT 408* 287   Cardiac Enzymes: No results for input(s): CKTOTAL, CKMB, CKMBINDEX, TROPONINI in the last 168 hours. BNP: Invalid input(s): POCBNP CBG: No results for input(s): GLUCAP in the last 168 hours. D-Dimer No results for input(s): DDIMER in the last 72 hours. Hgb A1c No results for input(s): HGBA1C in the last 72 hours. Lipid Profile No results for input(s): CHOL, HDL, LDLCALC, TRIG, CHOLHDL, LDLDIRECT in the last 72 hours. Thyroid function studies No results for input(s): TSH, T4TOTAL, T3FREE, THYROIDAB in the last 72 hours.  Invalid input(s): FREET3 Anemia work up No results for input(s): VITAMINB12, FOLATE, FERRITIN, TIBC, IRON, RETICCTPCT in the last 72 hours. Microbiology Recent Results (from the past 240 hour(s))  Culture, blood (routine x 2) Call MD if unable to obtain prior to antibiotics being given     Status: Abnormal (Preliminary result)   Collection Time: 01/29/18  1:01 AM  Result Value Ref Range Status   Specimen Description BLOOD RIGHT HAND  Final   Special Requests IN PEDIATRIC BOTTLE Blood Culture adequate volume  Final   Culture  Setup Time   Final    GRAM POSITIVE COCCI IN PEDIATRIC BOTTLE CRITICAL RESULT CALLED TO, READ BACK BY AND VERIFIED WITHMerlene Morse [facility] 01/30/18 0303 JDW Performed at [facility], [address]  [place], [place] [address]    Culture STAPHYLOCOCCUS SPECIES (COAGULASE NEGATIVE) (A)  Final   Report Status PENDING  Incomplete  Blood Culture ID Panel (Reflexed)     Status: Abnormal   Collection Time: 01/29/18  1:01 AM  Result Value Ref Range Status   Enterococcus species NOT DETECTED NOT DETECTED Final   Listeria monocytogenes NOT DETECTED NOT DETECTED Final   Staphylococcus species DETECTED (A) NOT DETECTED Final    Comment: Methicillin (oxacillin) susceptible coagulase negative staphylococcus. Possible blood culture contaminant (unless isolated from more  than one blood culture draw or clinical case suggests pathogenicity). No antibiotic treatment is indicated for blood  culture contaminants. CRITICAL RESULT CALLED TO, READ BACK BY AND VERIFIED WITH: V BRYK PHARMD 01/30/18 0303 JDW    Staphylococcus aureus NOT DETECTED NOT DETECTED Final   Methicillin resistance NOT DETECTED NOT DETECTED Final   Streptococcus species NOT DETECTED NOT DETECTED Final   Streptococcus agalactiae NOT DETECTED NOT DETECTED Final   Streptococcus pneumoniae NOT DETECTED NOT DETECTED Final   Streptococcus pyogenes NOT DETECTED NOT DETECTED Final   Acinetobacter baumannii NOT DETECTED NOT DETECTED Final   Enterobacteriaceae species NOT DETECTED NOT DETECTED Final   Enterobacter cloacae complex NOT DETECTED NOT DETECTED Final   Escherichia coli NOT DETECTED NOT DETECTED Final   Klebsiella oxytoca NOT DETECTED NOT DETECTED Final   Klebsiella pneumoniae NOT DETECTED NOT DETECTED Final   Proteus species NOT DETECTED NOT DETECTED Final   Serratia marcescens NOT DETECTED NOT DETECTED Final   Haemophilus influenzae NOT DETECTED NOT DETECTED Final   Neisseria meningitidis NOT DETECTED NOT DETECTED Final   Pseudomonas aeruginosa NOT DETECTED NOT DETECTED Final   Candida albicans NOT DETECTED NOT DETECTED Final   Candida glabrata NOT DETECTED NOT DETECTED Final   Candida krusei NOT DETECTED NOT DETECTED Final   Candida parapsilosis NOT DETECTED NOT DETECTED Final   Candida tropicalis NOT DETECTED NOT DETECTED Final    Comment: Performed at [facility], [address] [place], [place] [address]  Culture, blood (routine x 2) Call MD if unable to obtain prior to antibiotics being given     Status: None (Preliminary result)   Collection Time: 01/29/18  1:12 AM  Result Value Ref Range Status   Specimen Description BLOOD LEFT ANTECUBITAL  Final   Special Requests   Final    BOTTLES DRAWN AEROBIC AND ANAEROBIC Blood Culture adequate volume   Culture   Final    NO  GROWTH 2 DAYS Performed at [facility], [address] [place], [place] [address]    Report Status PENDING  Incomplete  Culture, sputum-assessment     Status: None (Preliminary result)   Collection Time: 01/29/18  9:36 PM  Result Value Ref Range Status   Specimen Description EXPECTORATED SPUTUM  Final   Special Requests NONE  Final   Sputum evaluation   Final    THIS SPECIMEN IS ACCEPTABLE FOR SPUTUM CULTURE Performed at [facility], [address] [place], [place] [address]    Report Status PENDING  Incomplete  Culture, respiratory (NON-Expectorated)     Status: None (Preliminary result)   Collection Time: 01/29/18  9:36 PM  Result Value Ref Range Status   Specimen Description EXPECTORATED SPUTUM  Final   Special Requests NONE Reflexed from [id number]  Final   Gram Stain   Final    ABUNDANT WBC PRESENT, PREDOMINANTLY PMN ABUNDANT GRAM POSITIVE COCCI MODERATE GRAM NEGATIVE COCCOBACILLI    Culture   Final  CULTURE REINCUBATED FOR BETTER GROWTH Performed at [facility], [address] [place], [place] [address]    Report Status PENDING  Incomplete     Discharge Instructions:   Discharge Instructions    Diet general   Complete by:  As directed    Discharge instructions   Complete by:  As directed    Avoid tobacco smoke   Increase activity slowly   Complete by:  As directed      Allergies as of 01/31/2018      Reactions   Darvon [propoxyphene] Other (See Comments)   Patient was aware her spoken words were not matching her thoughts   Percocet [oxycodone-acetaminophen] Other (See Comments)   Patient was aware her spoken words were not matching her thoughts      Medication List    STOP taking these medications   ADVIL PM 200-25 MG Caps Generic drug:  Ibuprofen-diphenhydrAMINE HCl     TAKE these medications   albuterol 108 (90 Base) MCG/ACT inhaler Commonly known as:  PROVENTIL HFA;VENTOLIN HFA Inhale 1-2 puffs into the lungs every 6 (six)  hours as needed for wheezing or shortness of breath. What changed:  Another medication with the same name was added. Make sure you understand how and when to take each.   albuterol (2.5 MG/3ML) 0.083% nebulizer solution Commonly known as:  PROVENTIL Take 3 mLs (2.5 mg total) by nebulization 4 (four) times daily. With the ipratropium What changed:  You were already taking a medication with the same name, and this prescription was added. Make sure you understand how and when to take each.   cyclobenzaprine 5 MG tablet Commonly known as:  FLEXERIL Take 1 tablet (5 mg total) by mouth 3 (three) times daily as needed for muscle spasms.   doxycycline 100 MG tablet Commonly known as:  VIBRA-TABS Take 1 tablet (100 mg total) by mouth every 12 (twelve) hours.   feeding supplement (ENSURE ENLIVE) Liqd Take 237 mLs by mouth 2 (two) times daily between meals.   HYDROcodone-acetaminophen 5-325 MG tablet Commonly known as:  NORCO/VICODIN Take 1 tablet by mouth every 6 (six) hours as needed for moderate pain.   ipratropium 0.02 % nebulizer solution Commonly known as:  ATROVENT Take 2.5 mLs (0.5 mg total) by nebulization 4 (four) times daily.   nicotine 21 mg/24hr patch Commonly known as:  NICODERM CQ - dosed in mg/24 hours Place 1 patch (21 mg total) onto the skin daily. Start taking on:  02/01/2018   ONE-A-DAY WOMENS FORMULA PO Take 1 tablet by mouth daily.   pantoprazole 40 MG tablet Commonly known as:  PROTONIX Take 1 tablet (40 mg total) by mouth daily. While on steroids   predniSONE 10 MG tablet Commonly known as:  DELTASONE 50 mg x 2 days then 40 mg x 2 days then 30 mg x 2 days then 20 mg x 2 days then 10mg  x 2 days then stop   VITAMIN B-12 PO Take 1 tablet by mouth daily.            Durable Medical Equipment  (From admission, onward)        Start     Ordered   01/31/18 0834  For home use only DME Nebulizer machine  Once    Question:  Patient needs a nebulizer to treat  with the following condition  Answer:  COPD (chronic obstructive pulmonary disease) (HCC)   01/31/18 0833     Follow-up Information    [facility] Follow up on 02/07/2018.   Why:  9:30 for hospital follow up Contact information: [address] [place] [address] [phone]           Time coordinating discharge: 35 min  Signed:  Joseph Art   [facility] 01/31/2018, 12:18 PM

## 2018-01-31 NOTE — Care Management Note (Addendum)
Case Management Note  Patient Details  Name: Elizabeth Villarreal MRN: [id number] Date of Birth: [birth year]  Subjective/Objective:  From home with her son and her ex boyfriend, pta indep, she does not have PCP or insurance, NCM scheduled a follow up apt for her at the [facility]6 at 9:30.  She presents with COPD Ex.  She states she has an albuterol inhaler she received from the ED. She will also need a nebulizer machine,  [facility]  [facility], NCM gave her brochure for [facility].                  Action/Plan: NCM will follow for transition of care needs.   Expected Discharge Date:  01/31/18               Expected Discharge Plan:  Home/Self Care  In-House Referral:     Discharge planning Services  CM Consult, Follow-up appt scheduled, Indigent Health Clinic, Medication Assistance  Post Acute Care Choice:    Choice offered to:     DME Arranged:    DME Agency:     HH Arranged:    HH Agency:     Status of Service:  In process, will continue to follow  If discussed at Long Length of Stay Meetings, dates discussed:    Additional Comments:  Elizabeth Villarreal, Elizabeth Afzal Clinton, RN 01/31/2018, 10:02 AM

## 2018-01-31 NOTE — Care Management Note (Addendum)
Case Management Note  Patient Details  Name: Elizabeth Villarreal MRN: [id number] Date of Birth: Oct 22, 1971  Subjective/Objective:   From home with her son and her ex boyfriend, pta indep, she does not have PCP or insurance, NCM scheduled a follow up apt for her at the [facility] 3[facility]6 at 9:30.  She presents with COPD Ex.  She states she has an albuterol inhaler she received from the ED. She will also need a nebulizer machine,  [facility]  [facility], NCM gave her brochure for [facility].  daughter at bedside will transport patient home.                                Action/Plan: DC home today.  Expected Discharge Date:  01/31/18               Expected Discharge Plan:  Home/Self Care  In-House Referral:     Discharge planning Services  CM Consult, Follow-up appt scheduled, Indigent Health Clinic, Medication Assistance  Post Acute Care Choice:    Choice offered to:     DME Arranged:   neb machine  DME Agency[facility]   [facility]  HH Arranged:    HH Agency:     Status of Service:  Completed, signed off  If discussed at Long Length of Stay Meetings, dates discussed:    Additional Comments:  Leone Havenaylor, Serayah Yazdani Clinton, RN 01/31/2018, 3:33 PM

## 2018-02-01 LAB — CULTURE, RESPIRATORY W GRAM STAIN

## 2018-02-01 LAB — CULTURE, RESPIRATORY

## 2018-02-03 LAB — CULTURE, BLOOD (ROUTINE X 2)
CULTURE: NO GROWTH
SPECIAL REQUESTS: ADEQUATE

## 2018-02-07 ENCOUNTER — Ambulatory Visit: Payer: Medicaid Other | Attending: Critical Care Medicine | Admitting: Critical Care Medicine

## 2018-02-07 ENCOUNTER — Other Ambulatory Visit: Payer: Self-pay

## 2018-02-07 ENCOUNTER — Ambulatory Visit: Payer: Self-pay | Attending: Family Medicine | Admitting: Licensed Clinical Social Worker

## 2018-02-07 ENCOUNTER — Encounter: Payer: Self-pay | Admitting: Critical Care Medicine

## 2018-02-07 VITALS — BP 112/76 | HR 88 | Temp 98.8°F | Resp 12 | Ht 66.0 in | Wt 104.4 lb

## 2018-02-07 DIAGNOSIS — Z87891 Personal history of nicotine dependence: Secondary | ICD-10-CM | POA: Insufficient documentation

## 2018-02-07 DIAGNOSIS — J441 Chronic obstructive pulmonary disease with (acute) exacerbation: Secondary | ICD-10-CM | POA: Diagnosis not present

## 2018-02-07 DIAGNOSIS — F329 Major depressive disorder, single episode, unspecified: Secondary | ICD-10-CM | POA: Diagnosis not present

## 2018-02-07 DIAGNOSIS — F32A Depression, unspecified: Secondary | ICD-10-CM | POA: Insufficient documentation

## 2018-02-07 DIAGNOSIS — J449 Chronic obstructive pulmonary disease, unspecified: Secondary | ICD-10-CM | POA: Diagnosis present

## 2018-02-07 DIAGNOSIS — Z885 Allergy status to narcotic agent status: Secondary | ICD-10-CM | POA: Diagnosis not present

## 2018-02-07 DIAGNOSIS — Z8701 Personal history of pneumonia (recurrent): Secondary | ICD-10-CM | POA: Insufficient documentation

## 2018-02-07 DIAGNOSIS — Z886 Allergy status to analgesic agent status: Secondary | ICD-10-CM | POA: Diagnosis not present

## 2018-02-07 DIAGNOSIS — R911 Solitary pulmonary nodule: Secondary | ICD-10-CM | POA: Diagnosis not present

## 2018-02-07 DIAGNOSIS — E43 Unspecified severe protein-calorie malnutrition: Secondary | ICD-10-CM | POA: Insufficient documentation

## 2018-02-07 DIAGNOSIS — Z825 Family history of asthma and other chronic lower respiratory diseases: Secondary | ICD-10-CM | POA: Insufficient documentation

## 2018-02-07 DIAGNOSIS — Z79899 Other long term (current) drug therapy: Secondary | ICD-10-CM | POA: Insufficient documentation

## 2018-02-07 DIAGNOSIS — Z720 Tobacco use: Secondary | ICD-10-CM

## 2018-02-07 DIAGNOSIS — F320 Major depressive disorder, single episode, mild: Secondary | ICD-10-CM | POA: Insufficient documentation

## 2018-02-07 DIAGNOSIS — Z801 Family history of malignant neoplasm of trachea, bronchus and lung: Secondary | ICD-10-CM | POA: Insufficient documentation

## 2018-02-07 MED ORDER — IPRATROPIUM BROMIDE 0.02 % IN SOLN: 0.5000 mg | Freq: Four times a day (QID) | RESPIRATORY_TRACT | 6 refills | Status: DC

## 2018-02-07 MED ORDER — ALBUTEROL SULFATE (2.5 MG/3ML) 0.083% IN NEBU: 2.5000 mg | INHALATION_SOLUTION | Freq: Four times a day (QID) | RESPIRATORY_TRACT | 6 refills | Status: DC

## 2018-02-07 MED ORDER — ALBUTEROL SULFATE HFA 108 (90 BASE) MCG/ACT IN AERS: 1.0000 | INHALATION_SPRAY | Freq: Four times a day (QID) | RESPIRATORY_TRACT | 3 refills | Status: DC | PRN

## 2018-02-07 MED FILL — ALBUTEROL 0.083% INHAL SOLN: (2.5 MG/3ML | 5 days supply | Qty: 90 | Fill #0

## 2018-02-07 MED FILL — !VENTOLIN HFA INHALER: 108 (90 BAS | 25 days supply | Qty: 18 | Fill #0

## 2018-02-07 MED FILL — IPRATROPIUM BR 0.02% SOLN: 0.02 | 12 days supply | Qty: 313 | Fill #0

## 2018-02-07 NOTE — Patient Instructions (Signed)
Stay on albuterol and ipratroprium 4 times daily , refills needed Finish prednisone Stop protonix when prednisone is completed We will obtain a primary care MD A social work consult will be obtained A lung function test will be obtained Return 2 months

## 2018-02-07 NOTE — Assessment & Plan Note (Signed)
-   Tobacco cessation. °

## 2018-02-07 NOTE — Assessment & Plan Note (Signed)
Copd exac Plan Stay on albuterol and ipratroprium 4 times daily , refills needed Finish prednisone Stop protonix when prednisone is completed Smoking cessation

## 2018-02-07 NOTE — Progress Notes (Signed)
Hospitalization f/u  [facility]- 01/27/2018 Denies abdominal pain Passing gas and noticed bloating Last dose of pain medication 3 days

## 2018-02-07 NOTE — Progress Notes (Signed)
Subjective:    Patient ID: Elizabeth Villarreal, female    DOB: [birth year], 47 y.o.   MRN: [id number]  47 y.o.F recent admission 2/24 - 2/27 for Copd exacerbation.  Here for post hospital f/u Pt dx with PNA 09/2017, then was dx with COPD.  Pt had a mother with COPD and she recently expired. Hx of dyspnea for several years.  Sx came on insideously, thought was anxiety,    Home environment is not good.  Was caring for a son of a partner and now son wants her evicted.  Share a biologic son same address   Shortness of Breath  This is a chronic problem. The current episode started more than 1 year ago. The problem occurs daily (pt with DOE, up stairs, lifting heavy objexts). The problem has been gradually improving. Associated symptoms include chest pain, PND, sputum production and wheezing. Pertinent negatives include no abdominal pain, headaches, hemoptysis, leg pain, leg swelling, orthopnea, rash, sore throat or swollen glands. The symptoms are aggravated by odors, weather changes, URIs, lying flat, any activity, exercise and fumes. Risk factors include smoking.   Acute on chronic respiratory failure with hypoxiadue toCOPD exacerbation (HCC): -Nebulizers: scheduled Duoneb and prn albuterol-- $4 at [facility] -PO steroid taper -Doxycycline - SLP Eval: normal diet -Mucinex for cough  -UrineS. pneumococcal antigen negative; flu negative -prn Norco and flexeril for left flank pain Home O2 eval showed  Tobacco abuse : -Did counseling about importance of quitting smoking -Nicotine patch  Protein-calorie malnutrition, severe (HCC): -encouraged PO intake  Lung nodule:CAT showed 14 mm RIGHT lower lobe pulmonary nodule with centralcalcification, which ismost compatible with healing granulomaperthe radiologist's report -f/u with PCP   No PFTs ever done  Past Medical History:  Diagnosis Date  . COPD (chronic obstructive pulmonary disease) (HCC)   . Tobacco abuse      Family History    Problem Relation Age of Onset  . COPD Mother   . Lung cancer Father      Social History   Socioeconomic History  . Marital status: Single    Spouse name: Not on file  . Number of children: Not on file  . Years of education: Not on file  . Highest education level: Not on file  Social Needs  . Financial resource strain: Not on file  . Food insecurity - worry: Not on file  . Food insecurity - inability: Not on file  . Transportation needs - medical: Not on file  . Transportation needs - non-medical: Not on file  Occupational History  . Not on file  Tobacco Use  . Smoking status: Former Smoker    Packs/day: 1.00    Types: Cigarettes  . Smokeless tobacco: Never Used  . Tobacco comment: last cigarette 12 days ago  Substance and Sexual Activity  . Alcohol use: No  . Drug use: No  . Sexual activity: Not Currently    Birth control/protection: None  Other Topics Concern  . Not on file  Social History Narrative  . Not on file     Allergies  Allergen Reactions  . Darvon [Propoxyphene] Other (See Comments)    Patient was aware her spoken words were not matching her thoughts  . Percocet [Oxycodone-Acetaminophen] Other (See Comments)    Patient was aware her spoken words were not matching her thoughts     Outpatient Medications Prior to Visit  Medication Sig Dispense Refill  . Multiple Vitamins-Calcium (ONE-A-DAY WOMENS FORMULA PO) Take 1 tablet by mouth daily.     Marland Kitchen  pantoprazole (PROTONIX) 40 MG tablet Take 1 tablet (40 mg total) by mouth daily. While on steroids 30 tablet 0  . predniSONE (DELTASONE) 10 MG tablet 50 mg x 2 days then 40 mg x 2 days then 30 mg x 2 days then 20 mg x 2 days then 10mg  x 2 days then stop 30 tablet 0  . albuterol (PROVENTIL HFA;VENTOLIN HFA) 108 (90 Base) MCG/ACT inhaler Inhale 1-2 puffs into the lungs every 6 (six) hours as needed for wheezing or shortness of breath.    Marland Kitchen albuterol (PROVENTIL) (2.5 MG/3ML) 0.083% nebulizer solution Take 3 mLs (2.5  mg total) by nebulization 4 (four) times daily. With the ipratropium 75 mL 3  . ipratropium (ATROVENT) 0.02 % nebulizer solution Take 2.5 mLs (0.5 mg total) by nebulization 4 (four) times daily. 75 mL 12  . Cyanocobalamin (VITAMIN B-12 PO) Take 1 tablet by mouth daily.     . cyclobenzaprine (FLEXERIL) 5 MG tablet Take 1 tablet (5 mg total) by mouth 3 (three) times daily as needed for muscle spasms. (Patient not taking: Reported on 02/07/2018) 15 tablet 0  . doxycycline (VIBRA-TABS) 100 MG tablet Take 1 tablet (100 mg total) by mouth every 12 (twelve) hours. (Patient not taking: Reported on 02/07/2018) 10 tablet 0  . feeding supplement, ENSURE ENLIVE, (ENSURE ENLIVE) LIQD Take 237 mLs by mouth 2 (two) times daily between meals. (Patient not taking: Reported on 02/07/2018) 237 mL 12  . HYDROcodone-acetaminophen (NORCO/VICODIN) 5-325 MG tablet Take 1 tablet by mouth every 6 (six) hours as needed for moderate pain. (Patient not taking: Reported on 02/07/2018) 10 tablet 0  . nicotine (NICODERM CQ - DOSED IN MG/24 HOURS) 21 mg/24hr patch Place 1 patch (21 mg total) onto the skin daily. (Patient not taking: Reported on 02/07/2018) 28 patch 0   No facility-administered medications prior to visit.      Review of Systems  HENT: Negative for sore throat.   Respiratory: Positive for sputum production, shortness of breath and wheezing. Negative for hemoptysis.   Cardiovascular: Positive for chest pain and PND. Negative for orthopnea and leg swelling.  Gastrointestinal: Negative for abdominal pain.  Skin: Negative for rash.  Neurological: Negative for headaches.       Objective:   Physical Exam Vitals:   02/07/18 0938  BP: 112/76  Pulse: 88  Resp: 12  Temp: 98.8 F (37.1 C)  TempSrc: Oral  SpO2: 98%  Weight: 104 lb 6.4 oz (47.4 kg)  Height: 5\' 6"  (1.676 m)    Gen: Pleasant, well-nourished, in no distress,  normal affect  ENT: No lesions,  mouth clear,  oropharynx clear, no postnasal drip  Neck: No  JVD, no TMG, no carotid bruits  Lungs: No use of accessory muscles, no dullness to percussion, exp wheezes  Cardiovascular: RRR, heart sounds normal, no murmur or gallops, no peripheral edema  Abdomen: soft and NT, no HSM,  BS normal  Musculoskeletal: No deformities, no cyanosis or clubbing  Neuro: alert, non focal  Skin: Warm, no lesions or rashes  No results found.        Assessment & Plan:  I personally reviewed all images and lab data in the [facility] system as well as any outside material available during this office visit and agree with the  radiology impressions.   COPD exacerbation (HCC) Copd exac Plan Stay on albuterol and ipratroprium 4 times daily , refills needed Finish prednisone Stop protonix when prednisone is completed Smoking cessation  Tobacco abuse Tobacco cessation  Elizabeth Villarreal was seen today for hospitalization follow-up.  Diagnoses and all orders for this visit:  COPD exacerbation (HCC) -     Pulmonary Function Test; Future  Tobacco abuse  Protein-calorie malnutrition, severe (HCC)  Reactive depression  Other orders -     ipratropium (ATROVENT) 0.02 % nebulizer solution; Take 2.5 mLs (0.5 mg total) by nebulization 4 (four) times daily. -     albuterol (PROVENTIL) (2.5 MG/3ML) 0.083% nebulizer solution; Take 3 mLs (2.5 mg total) by nebulization 4 (four) times daily. With the ipratropium -     albuterol (PROVENTIL HFA;VENTOLIN HFA) 108 (90 Base) MCG/ACT inhaler; Inhale 1-2 puffs into the lungs every 6 (six) hours as needed for wheezing or shortness of breath.

## 2018-02-07 NOTE — BH Specialist Note (Signed)
Integrated Behavioral Health Initial Visit  MRN: [id number] Name: Elizabeth Villarreal  Number of Integrated Behavioral Health Clinician visits:: 1/6 Session Start time: 10:30 AM  Session End time: 11:10 PM Total time: 40 minutes  Type of Service: Integrated Behavioral Health- Individual/Family Interpretor:No. Interpretor Name and Language: N/A   Warm Hand Off Completed.       SUBJECTIVE: Elizabeth Villarreal is a 47 y.o. female accompanied by self Patient was referred by Dr. Delford FieldWright for depression. Patient reports the following symptoms/concerns: feelings of sadness and worry, difficulty sleeping, low energy, feeling bad about self, and difficulty relaxing Duration of problem: months; Severity of problem: moderate  OBJECTIVE: Mood: Depressed and Affect: Tearful Risk of harm to self or others: No plan to harm self or others  LIFE CONTEXT: Family and Social: Pt resides at mother in law's residence with ex and minor son. Pt has four children (one adult daughter, two minor daughters that reside out of state, and one minor son) Both parents are deceased School/Work: Pt is unemployed Self-Care: Pt smokes cigarettes Life Changes: Pt is grieving the loss of her parents and the recent loss of mother in law, whom she was primary caregiver. Pt receives limited support and reports ongoing conflict with ex and father to minor son  GOALS ADDRESSED: Patient will: 1. Reduce symptoms of: anxiety and depression 2. Increase knowledge and/or ability of: coping skills  3. Demonstrate ability to: Increase adequate support systems for patient/family and Begin healthy grieving over loss  INTERVENTIONS: Interventions utilized: Mindfulness or Management consultantelaxation Training, Supportive Counseling, Psychoeducation and/or Health Education and Link to [facility]  Standardized Assessments completed: GAD-7 and PHQ 2&9  ASSESSMENT: Patient currently experiencing depression and anxiety triggered by the recent loss of a loved  one, financial strain, and ongoing conflict with ex. She reports feelings of sadness and worry, difficulty sleeping, low energy, feeling bad about self, and difficulty relaxing. She receives limited support.   Patient may benefit from psychoeducation and psychotherapyt. LCSWA educated pt on correlation between one's physical and mental health, in addition, to how stress can negatively impact health. Therapeutic interventions were discussed to assist in mindfulness/relaxation. LCSWA referred pt to Legal Aid to assist with questions regarding eviction. Pt provided resources for grief support, employment, crisis intervention, and food insecurity. Pt agreed to schedule appointment with financial counseling to assist with financial strain.   PLAN: 1. Follow up with behavioral health clinician on : Pt was encouraged to contact LCSWA if symptoms worsen or fail to improve [facility] 2. Behavioral recommendations: LCSWA recommends that pt apply healthy coping skills discussed, schedule appointment with financial counseling, and utilize provided resources. Pt is encouraged to schedule follow up appointment with LCSWA 3. Referral(s): Integrated Art gallery managerBehavioral Health Services (In Clinic) and [facility]:  Food, Architectinances and Legal Aid 4. "From scale of 1-10, how likely are you to follow plan?": 10/10  Bridgett LarssonJasmine D Mordecai Tindol, LCSW 02/08/18 11:21 AM

## 2018-02-14 ENCOUNTER — Ambulatory Visit (HOSPITAL_COMMUNITY)
Admission: RE | Admit: 2018-02-14 | Discharge: 2018-02-14 | Disposition: A | Discharge status: Home / Self Care | Payer: Medicaid Other | Source: Ambulatory Visit | Attending: Critical Care Medicine | Admitting: Critical Care Medicine

## 2018-02-14 DIAGNOSIS — R942 Abnormal results of pulmonary function studies: Secondary | ICD-10-CM | POA: Insufficient documentation

## 2018-02-14 DIAGNOSIS — J441 Chronic obstructive pulmonary disease with (acute) exacerbation: Secondary | ICD-10-CM | POA: Diagnosis not present

## 2018-02-14 LAB — PULMONARY FUNCTION TEST
DL/VA % pred: 52 %
DL/VA: 2.77 ml/min/mmHg/L
DLCO UNC % PRED: 34 %
DLCO unc: 10.1 ml/min/mmHg
FEF 25-75 Post: 0.52 L/sec
FEF 25-75 Pre: 0.26 L/sec
FEF2575-%CHANGE-POST: 101 %
FEF2575-%PRED-POST: 16 %
FEF2575-%Pred-Pre: 8 %
FEV1-%Change-Post: 34 %
FEV1-%PRED-POST: 28 %
FEV1-%Pred-Pre: 20 %
FEV1-POST: 0.92 L
FEV1-PRE: 0.68 L
FEV1FVC-%CHANGE-POST: -2 %
FEV1FVC-%Pred-Pre: 42 %
FEV6-%Change-Post: 37 %
FEV6-%PRED-PRE: 42 %
FEV6-%Pred-Post: 58 %
FEV6-PRE: 1.71 L
FEV6-Post: 2.34 L
FEV6FVC-%Change-Post: -1 %
FEV6FVC-%PRED-PRE: 88 %
FEV6FVC-%Pred-Post: 86 %
FVC-%Change-Post: 38 %
FVC-%PRED-POST: 66 %
FVC-%Pred-Pre: 48 %
FVC-POST: 2.75 L
FVC-PRE: 1.98 L
POST FEV1/FVC RATIO: 33 %
POST FEV6/FVC RATIO: 85 %
Pre FEV1/FVC ratio: 34 %
Pre FEV6/FVC Ratio: 86 %
RV % PRED: 321 %
RV: 6.18 L
TLC % pred: 147 %
TLC: 8.34 L

## 2018-02-14 MED ORDER — ALBUTEROL SULFATE (2.5 MG/3ML) 0.083% IN NEBU
2.5000 mg | INHALATION_SOLUTION | Freq: Once | RESPIRATORY_TRACT | Status: AC
  Administered 2018-02-14: 2.5 mg via RESPIRATORY_TRACT

## 2018-02-19 ENCOUNTER — Other Ambulatory Visit: Payer: Self-pay

## 2018-02-19 ENCOUNTER — Inpatient Hospital Stay (HOSPITAL_COMMUNITY)
Admission: EM | Admit: 2018-02-19 | Discharge: 2018-02-21 | DRG: 190 | Disposition: A | Discharge status: Home / Self Care | Payer: Medicaid Other | Attending: Internal Medicine | Admitting: Internal Medicine

## 2018-02-19 ENCOUNTER — Encounter (HOSPITAL_COMMUNITY): Payer: Self-pay | Admitting: Emergency Medicine

## 2018-02-19 ENCOUNTER — Emergency Department (HOSPITAL_COMMUNITY): Payer: Medicaid Other

## 2018-02-19 DIAGNOSIS — T424X5A Adverse effect of benzodiazepines, initial encounter: Secondary | ICD-10-CM | POA: Diagnosis not present

## 2018-02-19 DIAGNOSIS — Z87891 Personal history of nicotine dependence: Secondary | ICD-10-CM | POA: Diagnosis not present

## 2018-02-19 DIAGNOSIS — Z681 Body mass index (BMI) 19 or less, adult: Secondary | ICD-10-CM | POA: Diagnosis not present

## 2018-02-19 DIAGNOSIS — Y92238 Other place in hospital as the place of occurrence of the external cause: Secondary | ICD-10-CM | POA: Diagnosis not present

## 2018-02-19 DIAGNOSIS — E43 Unspecified severe protein-calorie malnutrition: Secondary | ICD-10-CM | POA: Diagnosis present

## 2018-02-19 DIAGNOSIS — J209 Acute bronchitis, unspecified: Secondary | ICD-10-CM | POA: Diagnosis present

## 2018-02-19 DIAGNOSIS — J441 Chronic obstructive pulmonary disease with (acute) exacerbation: Secondary | ICD-10-CM | POA: Diagnosis not present

## 2018-02-19 DIAGNOSIS — R400 Somnolence: Secondary | ICD-10-CM | POA: Diagnosis not present

## 2018-02-19 DIAGNOSIS — R911 Solitary pulmonary nodule: Secondary | ICD-10-CM | POA: Diagnosis present

## 2018-02-19 DIAGNOSIS — T481X5A Adverse effect of skeletal muscle relaxants [neuromuscular blocking agents], initial encounter: Secondary | ICD-10-CM | POA: Diagnosis not present

## 2018-02-19 DIAGNOSIS — R64 Cachexia: Secondary | ICD-10-CM | POA: Diagnosis present

## 2018-02-19 DIAGNOSIS — Z79899 Other long term (current) drug therapy: Secondary | ICD-10-CM

## 2018-02-19 DIAGNOSIS — F419 Anxiety disorder, unspecified: Secondary | ICD-10-CM | POA: Diagnosis present

## 2018-02-19 DIAGNOSIS — R269 Unspecified abnormalities of gait and mobility: Secondary | ICD-10-CM

## 2018-02-19 DIAGNOSIS — J440 Chronic obstructive pulmonary disease with acute lower respiratory infection: Secondary | ICD-10-CM | POA: Diagnosis present

## 2018-02-19 DIAGNOSIS — M6281 Muscle weakness (generalized): Secondary | ICD-10-CM

## 2018-02-19 DIAGNOSIS — F329 Major depressive disorder, single episode, unspecified: Secondary | ICD-10-CM

## 2018-02-19 DIAGNOSIS — Z8659 Personal history of other mental and behavioral disorders: Secondary | ICD-10-CM | POA: Diagnosis not present

## 2018-02-19 DIAGNOSIS — D649 Anemia, unspecified: Secondary | ICD-10-CM | POA: Diagnosis present

## 2018-02-19 DIAGNOSIS — J9621 Acute and chronic respiratory failure with hypoxia: Secondary | ICD-10-CM | POA: Diagnosis present

## 2018-02-19 DIAGNOSIS — F320 Major depressive disorder, single episode, mild: Secondary | ICD-10-CM | POA: Diagnosis present

## 2018-02-19 DIAGNOSIS — J449 Chronic obstructive pulmonary disease, unspecified: Secondary | ICD-10-CM | POA: Diagnosis present

## 2018-02-19 DIAGNOSIS — F32A Depression, unspecified: Secondary | ICD-10-CM | POA: Diagnosis present

## 2018-02-19 HISTORY — DX: Depression, unspecified: F32.A

## 2018-02-19 HISTORY — DX: Major depressive disorder, single episode, unspecified: F32.9

## 2018-02-19 LAB — I-STAT BETA HCG BLOOD, ED (MC, WL, AP ONLY): I-stat hCG, quantitative: <5 m[IU]/mL (ref <5)

## 2018-02-19 LAB — BASIC METABOLIC PANEL
Anion gap: 7 (ref 5–15)
BUN: 10 mg/dL (ref 6–20)
CHLORIDE: 104 mmol/L (ref 101–111)
CO2: 27 mmol/L (ref 22–32)
Calcium: 9.1 mg/dL (ref 8.9–10.3)
Creatinine, Ser: 0.65 mg/dL (ref 0.44–1.00)
GFR calc Af Amer: >60 mL/min (ref >60)
GFR calc non Af Amer: >60 mL/min (ref >60)
Glucose, Bld: 133 mg/dL — ABNORMAL HIGH (ref 65–99)
POTASSIUM: 3.9 mmol/L (ref 3.5–5.1)
SODIUM: 138 mmol/L (ref 135–145)

## 2018-02-19 LAB — CBC
HCT: 37 % (ref 36.0–46.0)
HEMATOCRIT: 41.1 % (ref 36.0–46.0)
Hemoglobin: 13.2 g/dL (ref 12.0–15.0)
Hemoglobin: 11.7 g/dL — ABNORMAL LOW (ref 12.0–15.0)
MCH: 28.4 pg (ref 26.0–34.0)
MCH: 27.9 pg (ref 26.0–34.0)
MCHC: 32.1 g/dL (ref 30.0–36.0)
MCHC: 31.6 g/dL (ref 30.0–36.0)
MCV: 88.4 fL (ref 78.0–100.0)
MCV: 88.3 fL (ref 78.0–100.0)
PLATELETS: 381 10*3/uL (ref 150–400)
Platelets: 423 10*3/uL — ABNORMAL HIGH (ref 150–400)
RBC: 4.65 MIL/uL (ref 3.87–5.11)
RBC: 4.19 MIL/uL (ref 3.87–5.11)
RDW: 21 % — AB (ref 11.5–15.5)
RDW: 21 % — AB (ref 11.5–15.5)
WBC: 9.8 10*3/uL (ref 4.0–10.5)
WBC: 7.5 10*3/uL (ref 4.0–10.5)

## 2018-02-19 LAB — I-STAT ARTERIAL BLOOD GAS, ED
Acid-base deficit: 1 mmol/L (ref 0.0–2.0)
Bicarbonate: 24.3 mmol/L (ref 20.0–28.0)
O2 Saturation: 90 %
PCO2 ART: 41.2 mmHg (ref 32.0–48.0)
PH ART: 7.379 (ref 7.350–7.450)
TCO2: 26 mmol/L (ref 22–32)
pO2, Arterial: 60 mmHg — ABNORMAL LOW (ref 83.0–108.0)

## 2018-02-19 LAB — CREATININE, SERUM
Creatinine, Ser: 0.66 mg/dL (ref 0.44–1.00)
GFR calc non Af Amer: >60 mL/min (ref >60)

## 2018-02-19 LAB — RAPID URINE DRUG SCREEN, HOSP PERFORMED
AMPHETAMINES: NOT DETECTED
Barbiturates: NOT DETECTED
Benzodiazepines: POSITIVE — AB
Cocaine: NOT DETECTED
Opiates: NOT DETECTED
TETRAHYDROCANNABINOL: NOT DETECTED

## 2018-02-19 LAB — TROPONIN I
Troponin I: <0.03 ng/mL (ref <0.03)
Troponin I: <0.03 ng/mL (ref <0.03)
Troponin I: <0.03 ng/mL (ref <0.03)
Troponin I: <0.03 ng/mL (ref <0.03)

## 2018-02-19 LAB — D-DIMER, QUANTITATIVE (NOT AT ARMC)

## 2018-02-19 MED ORDER — IPRATROPIUM-ALBUTEROL 0.5-2.5 (3) MG/3ML IN SOLN
3.0000 mL | Freq: Four times a day (QID) | RESPIRATORY_TRACT | Status: DC
  Administered 2018-02-19 – 2018-02-20 (×3): 3 mL via RESPIRATORY_TRACT
  Filled 2018-02-19 (×3): qty 3

## 2018-02-19 MED ORDER — ALBUTEROL SULFATE (2.5 MG/3ML) 0.083% IN NEBU: 2.5000 mg | INHALATION_SOLUTION | RESPIRATORY_TRACT | Status: DC | PRN

## 2018-02-19 MED ORDER — METHYLPREDNISOLONE SODIUM SUCC 40 MG IJ SOLR
40.0000 mg | Freq: Two times a day (BID) | INTRAMUSCULAR | Status: AC
  Administered 2018-02-19 – 2018-02-20 (×2): 40 mg via INTRAVENOUS
  Filled 2018-02-19 (×2): qty 1

## 2018-02-19 MED ORDER — ALBUTEROL SULFATE (2.5 MG/3ML) 0.083% IN NEBU
2.5000 mg | INHALATION_SOLUTION | RESPIRATORY_TRACT | Status: DC | PRN
  Administered 2018-02-19: 2.5 mg via RESPIRATORY_TRACT
  Filled 2018-02-19: qty 3

## 2018-02-19 MED ORDER — SODIUM CHLORIDE 0.9 % IV SOLN
INTRAVENOUS | Status: DC
  Administered 2018-02-19: 75 mL/h via INTRAVENOUS
  Administered 2018-02-20 – 2018-02-21 (×3): via INTRAVENOUS

## 2018-02-19 MED ORDER — ALBUTEROL (5 MG/ML) CONTINUOUS INHALATION SOLN
10.0000 mg/h | INHALATION_SOLUTION | RESPIRATORY_TRACT | Status: DC
  Administered 2018-02-19: 10 mg/h via RESPIRATORY_TRACT
  Filled 2018-02-19: qty 20

## 2018-02-19 MED ORDER — CYCLOBENZAPRINE HCL 10 MG PO TABS
5.0000 mg | ORAL_TABLET | Freq: Two times a day (BID) | ORAL | Status: DC | PRN
  Administered 2018-02-19: 5 mg via ORAL
  Filled 2018-02-19: qty 1

## 2018-02-19 MED ORDER — LORAZEPAM 2 MG/ML IJ SOLN
1.0000 mg | Freq: Once | INTRAMUSCULAR | Status: AC
  Administered 2018-02-19: 1 mg via INTRAVENOUS
  Filled 2018-02-19: qty 1

## 2018-02-19 MED ORDER — ENOXAPARIN SODIUM 40 MG/0.4ML ~~LOC~~ SOLN
40.0000 mg | SUBCUTANEOUS | Status: DC
  Administered 2018-02-19 – 2018-02-20 (×2): 40 mg via SUBCUTANEOUS
  Filled 2018-02-19 (×2): qty 0.4

## 2018-02-19 MED ORDER — ACETAMINOPHEN 325 MG PO TABS: 650.0000 mg | ORAL_TABLET | Freq: Four times a day (QID) | ORAL | Status: DC | PRN

## 2018-02-19 MED ORDER — CYCLOBENZAPRINE HCL 10 MG PO TABS
10.0000 mg | ORAL_TABLET | Freq: Once | ORAL | Status: AC
  Administered 2018-02-19: 10 mg via ORAL
  Filled 2018-02-19: qty 1

## 2018-02-19 MED ORDER — NICOTINE 14 MG/24HR TD PT24: 14.0000 mg | MEDICATED_PATCH | Freq: Every day | TRANSDERMAL | Status: DC

## 2018-02-19 MED ORDER — ONDANSETRON HCL 4 MG PO TABS: 4.0000 mg | ORAL_TABLET | Freq: Four times a day (QID) | ORAL | Status: DC | PRN

## 2018-02-19 MED ORDER — ALBUTEROL SULFATE (2.5 MG/3ML) 0.083% IN NEBU
5.0000 mg | INHALATION_SOLUTION | Freq: Once | RESPIRATORY_TRACT | Status: AC
  Administered 2018-02-19: 5 mg via RESPIRATORY_TRACT
  Filled 2018-02-19: qty 6

## 2018-02-19 MED ORDER — ONDANSETRON HCL 4 MG/2ML IJ SOLN: 4.0000 mg | Freq: Four times a day (QID) | INTRAMUSCULAR | Status: DC | PRN

## 2018-02-19 MED ORDER — ACETAMINOPHEN 650 MG RE SUPP: 650.0000 mg | Freq: Four times a day (QID) | RECTAL | Status: DC | PRN

## 2018-02-19 MED ORDER — IBUPROFEN 200 MG PO TABS: 400.0000 mg | ORAL_TABLET | Freq: Four times a day (QID) | ORAL | Status: DC | PRN

## 2018-02-19 MED ORDER — METHYLPREDNISOLONE SODIUM SUCC 125 MG IJ SOLR
125.0000 mg | Freq: Once | INTRAMUSCULAR | Status: AC
  Administered 2018-02-19: 125 mg via INTRAVENOUS
  Filled 2018-02-19: qty 2

## 2018-02-19 MED ORDER — SENNOSIDES-DOCUSATE SODIUM 8.6-50 MG PO TABS: 1.0000 | ORAL_TABLET | Freq: Every evening | ORAL | Status: DC | PRN

## 2018-02-19 NOTE — H&P (Signed)
History and Physical    Elizabeth AsaMary Brindley ZOX:[id number] DOB: December 09, 1970 DOA: 02/19/2018  PCP: System, Pcp Not In Consultants:  none Patient coming from: Home   Chief Complaint: shortness of breath  HPI: Elizabeth Villarreal is a 47 y.o. female with medical history significant of depression and COPD with continued tobacco abuse. Recent admission 2/24 through 01/31/18 for COPD exacerbation. She followed up with pulmonology on 3/6 and instructed to stay on albuterol and ipratroprium 4 times daily in addition to finishing prednisone taper. She has been instructed to quite smoking. Pt presented to ED with c/o sob and chest pain. Hx obtained from ED note as pt quite somnolent on exam. She did receive 1mg  IV Ativan and po Flexeril at around 0500 this am due to anxiety. According to note, she has been using her inhaler at home with little relief.    ED Course: Pt wheezing on arrival to ED with tachypnea. O2 sat 87% on room air up to 93% after neb. No infiltrate noted on xray. Pt was given hour-long nebulizer and IV Solumedrol with some improvement, but still with mild respiratory distress.   Review of Systems: unable to obtain  Ambulatory Status: Ambulates without assistance  Past Medical History:  Diagnosis Date  . COPD (chronic obstructive pulmonary disease) (HCC)   . Depression   . Tobacco abuse     Past Surgical History:  Procedure Laterality Date  . CESAREAN SECTION    . ECTOPIC PREGNANCY SURGERY    . ovarian cyst rupture    . ovary cyst rupture      Social History   Socioeconomic History  . Marital status: Single    Spouse name: Not on file  . Number of children: Not on file  . Years of education: Not on file  . Highest education level: Not on file  Social Needs  . Financial resource strain: Not on file  . Food insecurity - worry: Not on file  . Food insecurity - inability: Not on file  . Transportation needs - medical: Not on file  . Transportation needs - non-medical: Not on file    Occupational History  . Not on file  Tobacco Use  . Smoking status: Former Smoker    Packs/day: 1.00    Types: Cigarettes  . Smokeless tobacco: Never Used  . Tobacco comment: last cigarette 12 days ago  Substance and Sexual Activity  . Alcohol use: No  . Drug use: No  . Sexual activity: Not Currently    Birth control/protection: None  Other Topics Concern  . Not on file  Social History Narrative  . Not on file    Allergies  Allergen Reactions  . Darvon [Propoxyphene] Other (See Comments)    Patient was aware her spoken words were not matching her thoughts  . Percocet [Oxycodone-Acetaminophen] Other (See Comments)    Patient was aware her spoken words were not matching her thoughts    Family History  Problem Relation Age of Onset  . COPD Mother   . Lung cancer Father     Prior to Admission medications   Medication Sig Start Date End Date Taking? Authorizing Provider  acetaminophen (TYLENOL) 325 MG tablet Take 650 mg by mouth every 6 (six) hours as needed for mild pain.   Yes [provider]  albuterol (PROVENTIL HFA;VENTOLIN HFA) 108 (90 Base) MCG/ACT inhaler Inhale 1-2 puffs into the lungs every 6 (six) hours as needed for wheezing or shortness of breath. 02/07/18  Yes Storm FriskWright, Patrick E, MD  albuterol (PROVENTIL) (2.5 MG/3ML) 0.083% nebulizer solution Take 3 mLs (2.5 mg total) by nebulization 4 (four) times daily. With the ipratropium 02/07/18  Yes Storm Frisk, MD  ibuprofen (ADVIL,MOTRIN) 200 MG tablet Take 200 mg by mouth every 6 (six) hours as needed for moderate pain.   Yes [provider]  ipratropium (ATROVENT) 0.02 % nebulizer solution Take 2.5 mLs (0.5 mg total) by nebulization 4 (four) times daily. 02/07/18  Yes Storm Frisk, MD  Multiple Vitamins-Calcium (ONE-A-DAY WOMENS FORMULA PO) Take 1 tablet by mouth daily.    Yes [provider]  naproxen sodium (PAMPRIN ALL DAY RELIEF MAX ST) 220 MG tablet Take 220 mg by mouth daily as  needed (cramps).   Yes [provider]  pantoprazole (PROTONIX) 40 MG tablet Take 1 tablet (40 mg total) by mouth daily. While on steroids Patient not taking: Reported on 02/19/2018 01/31/18 01/31/19  Joseph Art, DO  predniSONE (DELTASONE) 10 MG tablet 50 mg x 2 days then 40 mg x 2 days then 30 mg x 2 days then 20 mg x 2 days then 10mg  x 2 days then stop Patient not taking: Reported on 02/19/2018 01/31/18   Joseph Art, DO    Physical Exam: Vitals:   02/19/18 0730 02/19/18 0800 02/19/18 0815 02/19/18 0830  BP: 96/67 97/67 94/67  92/65  Pulse: (!) 108 (!) 105 (!) 106 (!) 103  Resp: 18 19 20  (!) 22  Temp:      TempSrc:      SpO2: 93% 93% 96% 95%  Weight:      Height:         General: Awakens to tactile stimuli, but quick to fall back asleep Eyes: PERRL, EOMI, normal lids, iris ENT: unable to assess Neck: no LAD, masses or thyromegaly; no carotid bruits Cardiovascular: S1S2 RRR, no m/r/g. No LE edema.  Respiratory:  End expiratory wheeze upper lobes, diminished bilateral LL's. no rales or crackles, no increased wob Abdomen:  soft, seemingly NT, ND, NABS Skin: no rash or induration seen on limited exam Musculoskeletal: unable to assess Lower extremity: No LE edema.  Limited foot exam with no ulcerations.  2+ distal pulses. Psychiatric: somnolent Neurologic: unable to assess   Radiological Exams on Admission: Dg Chest Port 1 View  Result Date: 02/19/2018 CLINICAL DATA:  Shortness of breath. EXAM: PORTABLE CHEST 1 VIEW COMPARISON:  Radiographs and CT 01/28/2018 FINDINGS: Chronic hyperinflation and emphysema. Unchanged heart size and mediastinal contours. No consolidation, pleural effusion or pneumothorax. Pulmonary nodule on prior CT is not well seen radiographically. Normal pulmonary vasculature. No acute osseous abnormality. IMPRESSION: Emphysema without acute abnormality. Electronically Signed   By: Rubye Oaks M.D.   On: 02/19/2018 05:32    EKG: Independently  reviewed. Sinus tach with rate 114; nonspecific ST changes with no evidence of acute ischemia   Labs on Admission: I have personally reviewed the available labs and imaging studies at the time of the admission.  Pertinent labs:  ABG - ph 7.37            pCO2 41.2            pO2 60            HCO3 24.3 CBC wnl BMET wnl Trop neg x 1 Lactic acid 0.57    Assessment/Plan Principal Problem:   COPD exacerbation (HCC) Active Problems:   Tobacco abuse   Depression   COPD with acute exacerbation (HCC)   Acute on chronic hypoxic respiratory failure  secondary to COPD exacerbation -admit  -scheduled nebs and prn -IV Solumedrol q12 with eventual taper to prednisone -O2 via [place] to maintain O2 sats >90% -c/o chest pain on arrival. Suspect r/t respiratory issues. Will continue to cycle cardiac enzymes (negative thus far) -No evidence of pna on chest xray, WBC normal and afebrile, lactate normal. No empiric antibiotic tx for now a mild symptoms on admit  Increased somnolence -likely due to IV ativan and Flexeril in ED. ABG shows pCO2 41.2 -she is stable right now -check UDS -would avoid benzos during hospitalization  Tobacco abuse -counsel pt -Nicotine patch  Depression -pt being followed outpt by [facility] -no meds pta   DVT prophylaxis: SQ Lovenox Code Status: Full Family Communication: none available on admit Disposition Plan: Home once clinically improved Consults called: none Admission status: inpt tele  Cordelia Pen, NP-C [facility] [place] [facility] pgr [phone]  If note is complete, please contact covering daytime or nighttime physician. [email or web address] Password [email or web address]  02/19/2018, 9:30 AM

## 2018-02-19 NOTE — ED Notes (Signed)
Pt c/o wheeze and same noted at left upper

## 2018-02-19 NOTE — ED Notes (Signed)
Attermpted to draw labs withput success.

## 2018-02-19 NOTE — ED Notes (Signed)
Attempted to call report

## 2018-02-19 NOTE — ED Notes (Signed)
Pt transported to room 41 via stretcher. Pt rouses briefly but then returns to sleep. O2 sat noted as 88 but rises easily when repositioned.

## 2018-02-19 NOTE — ED Triage Notes (Signed)
Reports feeling SOB and having chest pain all day.  Hx of COPD.  Used neb treatment around 1130.

## 2018-02-19 NOTE — ED Notes (Signed)
Neb treatment not in progress .

## 2018-02-19 NOTE — ED Provider Notes (Addendum)
[facility]ENC[facility] Provider Note   CSN: [id number] Arrival date & time: 02/19/18  0058     History   Chief Complaint Chief Complaint  Patient presents with  . Shortness of Breath  . Chest Pain    HPI Annye AsaMary Vandekamp is a 47 y.o. female.  Patient is a 47 year old female with past medical history of COPD presenting for evaluation of shortness of breath.  This began earlier this evening.  She was admitted to the hospital several weeks ago with a similar presentation.  She reports wheezing and nonproductive cough.  She feels tightness in her ribs and abdominal muscles when she breathes.  She has been using her nebulizer at home with little relief.   The history is provided by the patient.  Shortness of Breath  This is a recurrent problem. The average episode lasts 8 hours. The problem occurs continuously.The problem has been gradually worsening. Associated symptoms include cough, wheezing and chest pain. Pertinent negatives include no fever and no sputum production. She has tried nothing for the symptoms. The treatment provided no relief.  Chest Pain   Associated symptoms include cough and shortness of breath. Pertinent negatives include no fever and no sputum production.    Past Medical History:  Diagnosis Date  . COPD (chronic obstructive pulmonary disease) (HCC)   . Tobacco abuse     Patient Active Problem List   Diagnosis Date Noted  . Depression 02/07/2018  . Tobacco abuse 01/29/2018  . COPD exacerbation (HCC) 01/29/2018  . Protein-calorie malnutrition, severe (HCC) 01/29/2018    Past Surgical History:  Procedure Laterality Date  . CESAREAN SECTION    . ECTOPIC PREGNANCY SURGERY    . ovarian cyst rupture    . ovary cyst rupture      OB History    No data available       Home Medications    Prior to Admission medications   Medication Sig Start Date End Date Taking? Authorizing Provider  acetaminophen (TYLENOL) 325 MG tablet Take  650 mg by mouth every 6 (six) hours as needed for mild pain.   Yes [provider]  albuterol (PROVENTIL HFA;VENTOLIN HFA) 108 (90 Base) MCG/ACT inhaler Inhale 1-2 puffs into the lungs every 6 (six) hours as needed for wheezing or shortness of breath. 02/07/18  Yes Storm FriskWright, Patrick E, MD  albuterol (PROVENTIL) (2.5 MG/3ML) 0.083% nebulizer solution Take 3 mLs (2.5 mg total) by nebulization 4 (four) times daily. With the ipratropium 02/07/18  Yes Storm FriskWright, Patrick E, MD  ibuprofen (ADVIL,MOTRIN) 200 MG tablet Take 200 mg by mouth every 6 (six) hours as needed for moderate pain.   Yes [provider]  ipratropium (ATROVENT) 0.02 % nebulizer solution Take 2.5 mLs (0.5 mg total) by nebulization 4 (four) times daily. 02/07/18  Yes Storm FriskWright, Patrick E, MD  Multiple Vitamins-Calcium (ONE-A-DAY WOMENS FORMULA PO) Take 1 tablet by mouth daily.    Yes [provider]  naproxen sodium (PAMPRIN ALL DAY RELIEF MAX ST) 220 MG tablet Take 220 mg by mouth daily as needed (cramps).   Yes [provider]  pantoprazole (PROTONIX) 40 MG tablet Take 1 tablet (40 mg total) by mouth daily. While on steroids Patient not taking: Reported on 02/19/2018 01/31/18 01/31/19  Joseph ArtVann, Jessica U, DO  predniSONE (DELTASONE) 10 MG tablet 50 mg x 2 days then 40 mg x 2 days then 30 mg x 2 days then 20 mg x 2 days then 10mg  x 2 days then stop Patient  not taking: Reported on 02/19/2018 01/31/18   Joseph Art, DO    Family History Family History  Problem Relation Age of Onset  . COPD Mother   . Lung cancer Father     Social History Social History   Tobacco Use  . Smoking status: Former Smoker    Packs/day: 1.00    Types: Cigarettes  . Smokeless tobacco: Never Used  . Tobacco comment: last cigarette 12 days ago  Substance Use Topics  . Alcohol use: No  . Drug use: No     Allergies   Darvon [propoxyphene] and Percocet [oxycodone-acetaminophen]   Review of Systems Review of Systems    Constitutional: Negative for fever.  Respiratory: Positive for cough, shortness of breath and wheezing. Negative for sputum production.   Cardiovascular: Positive for chest pain.  All other systems reviewed and are negative.    Physical Exam Updated Vital Signs BP (!) 144/99   Pulse (!) 112   Temp 97.6 F (36.4 C) (Oral)   Resp (!) 21   Ht 5' (1.524 m)   Wt 47.2 kg (104 lb)   SpO2 94%   BMI 20.31 kg/m   Physical Exam  Constitutional: She is oriented to person, place, and time. She appears well-developed and well-nourished. No distress.  HENT:  Head: Normocephalic and atraumatic.  Neck: Normal range of motion. Neck supple.  Cardiovascular: Normal rate and regular rhythm. Exam reveals no gallop and no friction rub.  No murmur heard. Pulmonary/Chest: Effort normal. No respiratory distress. She has wheezes in the right middle field and the left middle field.  There are slight expiratory wheezes bilaterally.  Abdominal: Soft. Bowel sounds are normal. She exhibits no distension. There is no tenderness.  Musculoskeletal: Normal range of motion.  Neurological: She is alert and oriented to person, place, and time.  Skin: Skin is warm and dry. She is not diaphoretic.  Nursing note and vitals reviewed.    ED Treatments / Results  Labs (all labs ordered are listed, but only abnormal results are displayed) Labs Reviewed  BASIC METABOLIC PANEL - Abnormal; Notable for the following components:      Result Value   Glucose, Bld 133 (*)    All other components within normal limits  CBC - Abnormal; Notable for the following components:   RDW 21.0 (*)    Platelets 423 (*)    All other components within normal limits  I-STAT BETA HCG BLOOD, ED ([facility], [facility], [facility] ONLY)    EKG  EKG Interpretation  Date/Time:  Monday February 19 2018 01:07:07 EDT Ventricular Rate:  114 PR Interval:  144 QRS Duration: 80 QT Interval:  320 QTC Calculation: 441 R Axis:   131 Text Interpretation:  Sinus  tachycardia Right atrial enlargement Right axis deviation Cannot rule out Anterior infarct , age undetermined Abnormal ECG Confirmed by Geoffery Lyons ([id number]) on 02/19/2018 6:05:16 AM       Radiology No results found.  Procedures Procedures (including critical care time)  Medications Ordered in ED Medications  albuterol (PROVENTIL,VENTOLIN) solution continuous neb (not administered)  methylPREDNISolone sodium succinate (SOLU-MEDROL) 125 mg/2 mL injection 125 mg (not administered)  albuterol (PROVENTIL) (2.5 MG/3ML) 0.083% nebulizer solution 5 mg (5 mg Nebulization Given 02/19/18 0108)     Initial Impression / Assessment and Plan / ED Course  I have reviewed the triage vital signs and the nursing notes.  Pertinent labs & imaging results that were available during my care of the patient were reviewed by me and considered  in my medical decision making (see chart for details).  Patient with history of newly diagnosed COPD presenting with wheezing and difficulty breathing.  Her workup today reveals emphysematous lungs, however no infiltrate.  She was given an hour-long neb along with Solu-Medrol.  She has improved somewhat, but continues with mild respiratory distress and I feel will require admission.  I have spoken with Dr. Antionette Char who agrees to admit.  CRITICAL CARE Performed by: Geoffery Lyons Total critical care time: 35 minutes Critical care time was exclusive of separately billable procedures and treating other patients. Critical care was necessary to treat or prevent imminent or life-threatening deterioration. Critical care was time spent personally by me on the following activities: development of treatment plan with patient and/or surrogate as well as nursing, discussions with consultants, evaluation of patient's response to treatment, examination of patient, obtaining history from patient or surrogate, ordering and performing treatments and interventions, ordering and review of  laboratory studies, ordering and review of radiographic studies, pulse oximetry and re-evaluation of patient's condition.   Final Clinical Impressions(s) / ED Diagnoses   Final diagnoses:  None    ED Discharge Orders    None       Geoffery Lyons, MD 02/19/18 1610    Geoffery Lyons, MD 02/19/18 9604    Geoffery Lyons, MD 02/19/18 [phone]

## 2018-02-19 NOTE — Care Management Note (Signed)
Case Management Note  Patient Details  Name: Elizabeth Villarreal MRN: [id number] Date of Birth: 1971-01-12  Subjective/Objective:                  [age] year old female with past medical history of COPD presenting for evaluation of shortness of breath. From home with significant other and children.  Action/Plan: Admit status INPATIENT (COPD); anticipate discharge HOME WITH HOME HEALTH.   Expected Discharge Date:  (unknown)               Expected Discharge Plan:  Home w Home Health Services  In-House Referral:     Discharge planning Services  CM Consult  Post Acute Care Choice:    Choice offered to:     DME Arranged:    DME Agency:     HH Arranged:    HH Agency:     Status of Service:  In process, will continue to follow  If discussed at Long Length of Stay Meetings, dates discussed:    Additional Comments: [facility] Dr Delford FieldWright for COPD sx and LCSW Ulla GalloJamine Lewis for depression. Oletta CohnWood, [place]nesia Vincelette, RN 02/19/2018, 2:30 PM

## 2018-02-20 ENCOUNTER — Other Ambulatory Visit: Payer: Self-pay

## 2018-02-20 LAB — BASIC METABOLIC PANEL
Anion gap: 8 (ref 5–15)
BUN: 14 mg/dL (ref 6–20)
CHLORIDE: 104 mmol/L (ref 101–111)
CO2: 24 mmol/L (ref 22–32)
CREATININE: 0.51 mg/dL (ref 0.44–1.00)
Calcium: 9.2 mg/dL (ref 8.9–10.3)
GFR calc non Af Amer: >60 mL/min (ref >60)
Glucose, Bld: 131 mg/dL — ABNORMAL HIGH (ref 65–99)
Potassium: 4.3 mmol/L (ref 3.5–5.1)
Sodium: 136 mmol/L (ref 135–145)

## 2018-02-20 LAB — CBC
HCT: 36 % (ref 36.0–46.0)
Hemoglobin: 11.3 g/dL — ABNORMAL LOW (ref 12.0–15.0)
MCH: 27.9 pg (ref 26.0–34.0)
MCHC: 31.4 g/dL (ref 30.0–36.0)
MCV: 88.9 fL (ref 78.0–100.0)
PLATELETS: 377 10*3/uL (ref 150–400)
RBC: 4.05 MIL/uL (ref 3.87–5.11)
RDW: 21.4 % — ABNORMAL HIGH (ref 11.5–15.5)
WBC: 14 10*3/uL — ABNORMAL HIGH (ref 4.0–10.5)

## 2018-02-20 MED ORDER — SODIUM CHLORIDE 0.9 % IV SOLN
500.0000 mg | INTRAVENOUS | Status: DC
  Administered 2018-02-20 – 2018-02-21 (×2): 500 mg via INTRAVENOUS
  Filled 2018-02-20 (×3): qty 500

## 2018-02-20 MED ORDER — IPRATROPIUM-ALBUTEROL 0.5-2.5 (3) MG/3ML IN SOLN
3.0000 mL | RESPIRATORY_TRACT | Status: DC
  Administered 2018-02-20 (×3): 3 mL via RESPIRATORY_TRACT
  Filled 2018-02-20 (×3): qty 3

## 2018-02-20 MED ORDER — BUDESONIDE 0.5 MG/2ML IN SUSP
0.5000 mg | Freq: Two times a day (BID) | RESPIRATORY_TRACT | Status: DC
  Administered 2018-02-20 – 2018-02-21 (×3): 0.5 mg via RESPIRATORY_TRACT
  Filled 2018-02-20 (×3): qty 2

## 2018-02-20 MED ORDER — IPRATROPIUM-ALBUTEROL 0.5-2.5 (3) MG/3ML IN SOLN
3.0000 mL | Freq: Four times a day (QID) | RESPIRATORY_TRACT | Status: DC
  Administered 2018-02-21 (×3): 3 mL via RESPIRATORY_TRACT
  Filled 2018-02-20 (×3): qty 3

## 2018-02-20 MED ORDER — BOOST / RESOURCE BREEZE PO LIQD CUSTOM
1.0000 | Freq: Three times a day (TID) | ORAL | Status: DC
  Administered 2018-02-20 – 2018-02-21 (×5): 1 via ORAL

## 2018-02-20 NOTE — Progress Notes (Signed)
Occupational Therapy Evaluation Patient Details Name: Elizabeth Villarreal MRN: [id number] DOB: 08/30/1971 Today's Date: 02/20/2018    History of Present Illness Elizabeth AsaMary Heckler is a 47 y.o. female with a Past Medical History of  depression, COPD, and tobacco dependence who presents with SOB and chest pain.  She was admitted for COPD exacerbation 3 weeks ago and returns with SOB.  Admit for COPD exacerbation.    Clinical Impression   PTA, pt living with her daughter and was independent with ADL and mobility. Pt reports increased difficulty with IADL tasks, i.e. cooking, laundry, cleaning due to increased SOB. 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 Pt became tearful during the session and states that she wants to work and needs to work but needs help in knowing what she can do/help finding an appropriate job. Pt also expressed frustration with knowing about her Medicaid application that she began previously. Recommend social work consult. Feel pt would benefit from pulmonary rehab to improve pulmonary status for potential employability and maximizing functional level of independence. Will follow acutely to facilitate safe DC home.     Follow Up Recommendations  No OT follow up;Supervision - Intermittent  Social Work Therapist, artConsult   Equipment Recommendations  Tub/shower seat    Recommendations for [employer] Other (comment)(social work; pulmonary rehab)     Precautions / Restrictions Precautions Precautions: Other (comment)(watch O2 sats) Restrictions Weight Bearing Restrictions: No      Mobility Bed Mobility Overal bed mobility: Independent                Transfers Overall transfer level: Independent                    Balance Overall balance assessment: Needs assistance Sitting-balance support: No upper extremity supported;Feet supported Sitting balance-Leahy Scale: Good     Standing balance support: No upper  extremity supported;During functional activity Standing balance-Leahy Scale: Fair Standing balance comment: does not lose balance with static or dynamic activities                           ADL either performed or assessed with clinical judgement   ADL Overall ADL's : Needs assistance/impaired     Grooming: Independent   Upper Body Bathing: Set up;Sitting   Lower Body Bathing: Set up;Sit to/from stand   Upper Body Dressing : Set up;Sitting   Lower Body Dressing: Set up;Sit to/from stand   Toilet Transfer: Ambulation;Modified Independent   Toileting- Clothing Manipulation and Hygiene: Modified independent         General ADL Comments: Pt discussed how she has had more difficulty with IADL tasks, i.e. laundry, cooking, cleaning lately with increased SOB. Pt began talking about being unemployed and how she is concerned about being able to pay her bills as she no longer has a job. 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 Pt very tearful during session. Will benefit form information on energy conservation.     Vision         Perception     Praxis      Pertinent Vitals/Pain Pain Assessment: No/denies pain     Hand Dominance Right   Extremity/Trunk Assessment Upper Extremity Assessment Upper Extremity Assessment: Overall WFL for tasks assessed   Lower Extremity Assessment Lower Extremity Assessment: Defer to  PT evaluation   Cervical / Trunk Assessment Cervical / Trunk Assessment: Normal   Communication Communication Communication: No difficulties   Cognition Arousal/Alertness: Awake/alert Behavior During Therapy: (tearful) Overall Cognitive Status: Within Functional Limits for tasks assessed                                     General Comments  Discussed at length energy conservation techniques and gave pt handout.  Pt tearful several times talking  about parents death as she was their caregiver as well as talking about not wanting to be a burden to her children. LListened and tried to offer encouragement and support.     Exercises     Shoulder Instructions      Home Living Family/patient expects to be discharged to:: Private residence Living Arrangements: Children(pt mentioned children will assist) Available Help at Discharge: Family;Available 24 hours/day(to stay with daughter) Type of Home: Apartment Home Access: Stairs to enter [employer] of Steps: 3 flights Entrance Stairs-Rails: Right;Left Home Layout: One level     Bathroom Shower/Tub: Chief Strategy Officer: Standard Bathroom Accessibility: Yes How Accessible: Accessible via walker Home Equipment: None(nebulizer)   Additional Comments: living with daughter      Prior Functioning/Environment Level of Independence: Independent                 OT Problem List: Decreased activity tolerance      OT Treatment/Interventions: Self-care/ADL training;Energy conservation;DME and/or AE instruction;Therapeutic activities;Patient/family education    OT Goals(Current goals can be found in the care plan section) Acute Rehab OT Goals Patient Stated Goal: to get help with managing her financial situation/healthcare OT Goal Formulation: With patient Time For Goal Achievement: 03/06/18 Potential to Achieve Goals: Good  OT Frequency: Min 2X/week   Barriers to D/C:            Co-evaluation              AM-PAC PT "6 Clicks" Daily Activity     Outcome Measure Help from another person eating meals?: None Help from another person taking care of personal grooming?: None Help from another person toileting, which includes using toliet, bedpan, or urinal?: None Help from another person bathing (including washing, rinsing, drying)?: None Help from another person to put on and taking off regular upper body clothing?: None Help from another  person to put on and taking off regular lower body clothing?: None 6 Click Score: 24   End of Session Nurse Communication: Other (comment)(need for social work consult)  Activity Tolerance: Patient tolerated treatment well Patient left: in chair;with call bell/phone within reach  OT Visit Diagnosis: Muscle weakness (generalized) (M62.81)                Time: 1610-9604 OT Time Calculation (min): 15 min Charges:  OT General Charges $OT Visit: 1 Visit OT Evaluation $OT Eval Low Complexity: 1 Low G-Codes:     Niquita Digioia, OT/L  [phone] 02/20/2018  Eugune Sine,HILLARY 02/20/2018, 12:54 PM

## 2018-02-20 NOTE — Evaluation (Addendum)
Physical Therapy Evaluation Patient Details Name: Elizabeth Villarreal MRN: [id number] DOB: [birth year] Today's Date: 02/20/2018   History of Present Illness  Elizabeth Villarreal is a 47 y.o. female with a Past Medical History of  depression, COPD, and tobacco dependence who presents with SOB and chest pain.  She was admitted for COPD exacerbation 3 weeks ago and returns with SOB.  Admit for COPD exacerbation.   Clinical Impression  Pt admitted with above diagnosis. Pt currently with functional limitations due to the deficits listed below (see PT Problem List). Pt was able to ambulate however does desat and need 2LO2 to keep sats >88% with activity.  Pt desat when doing steps to 86% on 2L.  HR and dyspnea incr with fatigue. Pt has 3 flights of steps to enter her apartment.  May benefit from Pulmonary rehab consult.  Pt will benefit from skilled PT to increase their independence and safety with mobility to allow discharge to the venue listed below.    SATURATION QUALIFICATIONS: (This note is used to comply with regulatory documentation for home oxygen)  Patient Saturations on Room Air at Rest = 87%  Patient Saturations on Room Air while Ambulating = 85%  Patient Saturations on 2 Liters of oxygen while Ambulating = 89%  Please briefly explain why patient needs home oxygen:Pt requires O2 at rest and with ambulation to keep sats above 88%.  Will need home O2.  Instructed in use of incentive spirometer.  Follow Up Recommendations Home health PT;Supervision/Assistance - 24 hour(Possibly pulmonary rehab consult?)    Equipment Recommendations  Other (comment)(TBA, home O2)    Recommendations for Other Services       Precautions / Restrictions Precautions Precautions: Fall Restrictions Weight Bearing Restrictions: No      Mobility  Bed Mobility Overal bed mobility: Independent                Transfers Overall transfer level: Independent                  Ambulation/Gait Ambulation/Gait  assistance: Supervision Ambulation Distance (Feet): 280 Feet Assistive device: None Gait Pattern/deviations: Step-through pattern;Decreased stride length   Gait velocity interpretation: Below normal speed for age/gender General Gait Details: Pt was able to ambulate in halls without assist.  Did require O2 to keep sats >88%.    Stairs Stairs: Yes Stairs assistance: Min guard Stair Management: Forwards;Alternating pattern;One rail Left Number of Stairs: 9 General stair comments: Pt safe on stairs but did desat to 86% on 2L with exertion at end of up and down 9 steps and had to purse lip breathe to get sats >90%  Wheelchair Mobility    Modified Rankin (Stroke Patients Only)       Balance Overall balance assessment: Needs assistance Sitting-balance support: No upper extremity supported;Feet supported Sitting balance-Leahy Scale: Fair     Standing balance support: No upper extremity supported;During functional activity Standing balance-Leahy Scale: Fair Standing balance comment: does not lose balance with static or dynamic activities                             Pertinent Vitals/Pain Pain Assessment: No/denies pain    Home Living Family/patient expects to be discharged to:: Private residence Living Arrangements: Children(pt mentioned children will assist) Available Help at Discharge: Family;Available 24 hours/day(to stay with daughter) Type of Home: Apartment Home Access: Stairs to enter Entrance Stairs-Rails: Right;Left Entrance Stairs-Number of Steps: 3 flights Home Layout: One level Home Equipment: None(nebulizer)  Prior Function Level of Independence: Independent               Hand Dominance        Extremity/Trunk Assessment   Upper Extremity Assessment Upper Extremity Assessment: Defer to OT evaluation    Lower Extremity Assessment Lower Extremity Assessment: Overall WFL for tasks assessed    Cervical / Trunk Assessment Cervical /  Trunk Assessment: Normal  Communication   Communication: No difficulties  Cognition Arousal/Alertness: Awake/alert Behavior During Therapy: WFL for tasks assessed/performed Overall Cognitive Status: Within Functional Limits for tasks assessed                                        General Comments General comments (skin integrity, edema, etc.): Discussed at length energy conservation techniques and gave pt handout.  Pt tearful several times talking about parents death as she was their caregiver as well as talking about not wanting to be a burden to her children. LListened and tried to offer encouragement and support.     Exercises     Assessment/Plan    PT Assessment Patient needs continued PT services  PT Problem List Decreased activity tolerance;Decreased mobility;Decreased safety awareness;Cardiopulmonary status limiting activity       PT Treatment Interventions DME instruction;Gait training;Functional mobility training;Stair training;Therapeutic activities;Therapeutic exercise;Balance training;Patient/family education    PT Goals (Current goals can be found in the Care Plan section)  Acute Rehab PT Goals Patient Stated Goal: to go home PT Goal Formulation: With patient Time For Goal Achievement: 03/06/18 Potential to Achieve Goals: Good    Frequency Min 3X/week   Barriers to discharge        Co-evaluation               AM-PAC PT "6 Clicks" Daily Activity  Outcome Measure Difficulty turning over in bed (including adjusting bedclothes, sheets and blankets)?: None Difficulty moving from lying on back to sitting on the side of the bed? : None Difficulty sitting down on and standing up from a chair with arms (e.g., wheelchair, bedside commode, etc,.)?: None Help needed moving to and from a bed to chair (including a wheelchair)?: A Little Help needed walking in hospital room?: A Little Help needed climbing 3-5 steps with a railing? : A Little 6 Click  Score: 21    End of Session Equipment Utilized During Treatment: Gait belt;Oxygen Activity Tolerance: Patient limited by fatigue Patient left: in chair;with call bell/phone within reach;with chair alarm set Nurse Communication: Mobility status PT Visit Diagnosis: Muscle weakness (generalized) (M62.81);Other abnormalities of gait and mobility (R26.89)    Time: [id number] PT Time Calculation (min) (ACUTE ONLY): 53 min   Charges:   PT Evaluation $PT Eval Moderate Complexity: 1 Mod PT Treatments $Gait Training: 23-37 mins $Self Care/Home Management: 8-22   PT G Codes:        Emlyn Maves,PT Acute Rehabilitation [phone] (pager)   Berline Lopesawn F Maudry Zeidan 02/20/2018, 11:11 AM

## 2018-02-20 NOTE — Progress Notes (Signed)
PROGRESS NOTE    Elizabeth Villarreal  QQV:[id number] DOB: 03-15-1971 DOA: 02/19/2018 PCP: System, Pcp Not In   Brief Narrative:  47 year old female with history of depression, COPD, tobacco use but recently quit comes to the hospital complains of shortness of breath.  She was admitted for COPD exacerbation.   Assessment & Plan:   Principal Problem:   COPD exacerbation (HCC) Active Problems:   Tobacco abuse   Depression   COPD with acute exacerbation (HCC)  Acute hypoxic respiratory distress, 82% on room air Acute mild to moderate exacerbation of mild persistent COPD - At this time we will continue patient on Solu-Medrol 40 mg IV twice daily -Nebulizer treatments scheduled and as necessary -Supplemental oxygen, currently on 2 L nasal cannula with plans to wean her off - Will add azithromycin daily for 5 days -Pulmicort twice daily added, she has incentive spirometry at bedside which she can continue -We will also give her flutter valve to enhance coughing and mobilizing mucus -Recently had outpatient PFTs with Dr. Delford Field  History of tobacco use - Currently stating she quit about a week ago and has not required nicotine patch.  We will keep her off of it but if necessary we can prescribe 1  History of depression -Outpatient follow-up with behavioral health, currently on any medications   Severe protein calorie malnutrition - We will get nutrition consult -Boost between meals   DVT prophylaxis: Lovenox Code Status: Full code Family Communication: None at bedside Disposition Plan: Maintain inpatient stay for IV steroids as patient still hypoxic.  Likely can be discharged in next 24-48 hours  Consultants:   None  Procedures:   None  Antimicrobials:   Azithromycin February 20, 2018   Subjective: States she feels slightly better than yesterday but still gets very short of breath with minimal ambulation.  This morning on room air when tried ambulating her oxygen saturation  dropped to mid to low 80s and then recovered with 2 L nasal cannula at rest.  Review of Systems Otherwise negative except as per HPI, including: General: Denies fever, chills, night sweats or unintended weight loss. Resp: Denies wheezing Cardiac: Denies chest pain, palpitations, orthopnea, paroxysmal nocturnal dyspnea. GI: Denies abdominal pain, nausea, vomiting, diarrhea or constipation GU: Denies dysuria, frequency, hesitancy or incontinence MS: Denies muscle aches, joint pain or swelling Neuro: Denies headache, neurologic deficits (focal weakness, numbness, tingling), abnormal gait Psych: Denies anxiety, depression, SI/HI/AVH Skin: Denies new rashes or lesions ID: Denies sick contacts, exotic exposures, travel  Objective: Vitals:   02/19/18 1700 02/19/18 2135 02/20/18 0527 02/20/18 0718  BP:  111/61 104/70   Pulse:  91 78   Resp:      Temp:  97.8 F (36.6 C) 97.7 F (36.5 C)   TempSrc:  Oral Oral   SpO2: 94% 94% 96% 92%  Weight:      Height:        Intake/Output Summary (Last 24 hours) at 02/20/2018 1106 Last data filed at 02/20/2018 0900 Gross per 24 hour  Intake 1076.25 ml  Output -  Net 1076.25 ml   Filed Weights   02/19/18 0104 02/19/18 1616  Weight: 47.2 kg (104 lb) 46.4 kg (102 lb 4.7 oz)    Examination:  General exam: Appears calm and comfortable, cachectic, temporal wasting Respiratory system: Diffuse diminished breath sounds Cardiovascular system: S1 & S2 heard, RRR. No JVD, murmurs, rubs, gallops or clicks. No pedal edema. Gastrointestinal system: Abdomen is nondistended, soft and nontender. No organomegaly or masses felt. Normal bowel  sounds heard. Central nervous system: Alert and oriented. No focal neurological deficits. Extremities: Symmetric 5 x 5 power. Skin: No rashes, lesions or ulcers Psychiatry: Judgement and insight appear normal. Mood & affect appropriate.     Data Reviewed:   CBC: Recent Labs  Lab 02/19/18 0121 02/19/18 1539  02/20/18 0341  WBC 9.8 7.5 14.0*  HGB 13.2 11.7* 11.3*  HCT 41.1 37.0 36.0  MCV 88.4 88.3 88.9  PLT 423* 381 377   Basic Metabolic Panel: Recent Labs  Lab 02/19/18 0121 02/19/18 1539 02/20/18 0341  NA 138  --  136  K 3.9  --  4.3  CL 104  --  104  CO2 27  --  24  GLUCOSE 133*  --  131*  BUN 10  --  14  CREATININE 0.65 0.66 0.51  CALCIUM 9.1  --  9.2   GFR: Estimated Creatinine Clearance: 64.4 mL/min (by C-G formula based on SCr of 0.51 mg/dL). Liver Function Tests: No results for input(s): AST, ALT, ALKPHOS, BILITOT, PROT, ALBUMIN in the last 168 hours. No results for input(s): LIPASE, AMYLASE in the last 168 hours. No results for input(s): AMMONIA in the last 168 hours. Coagulation Profile: No results for input(s): INR, PROTIME in the last 168 hours. Cardiac Enzymes: Recent Labs  Lab 02/19/18 0620 02/19/18 1152 02/19/18 1539 02/19/18 1622 02/19/18 1941  TROPONINI <0.03 <0.03 <0.03 <0.03 <0.03   BNP (last 3 results) No results for input(s): PROBNP in the last 8760 hours. HbA1C: No results for input(s): HGBA1C in the last 72 hours. CBG: No results for input(s): GLUCAP in the last 168 hours. Lipid Profile: No results for input(s): CHOL, HDL, LDLCALC, TRIG, CHOLHDL, LDLDIRECT in the last 72 hours. Thyroid Function Tests: No results for input(s): TSH, T4TOTAL, FREET4, T3FREE, THYROIDAB in the last 72 hours. Anemia Panel: No results for input(s): VITAMINB12, FOLATE, FERRITIN, TIBC, IRON, RETICCTPCT in the last 72 hours. Sepsis Labs: No results for input(s): PROCALCITON, LATICACIDVEN in the last 168 hours.  No results found for this or any previous visit (from the past 240 hour(s)).       Radiology Studies: Dg Chest Port 1 View  Result Date: 02/19/2018 CLINICAL DATA:  Shortness of breath. EXAM: PORTABLE CHEST 1 VIEW COMPARISON:  Radiographs and CT 01/28/2018 FINDINGS: Chronic hyperinflation and emphysema. Unchanged heart size and mediastinal contours. No  consolidation, pleural effusion or pneumothorax. Pulmonary nodule on prior CT is not well seen radiographically. Normal pulmonary vasculature. No acute osseous abnormality. IMPRESSION: Emphysema without acute abnormality. Electronically Signed   By: Rubye OaksMelanie  Ehinger M.D.   On: 02/19/2018 05:32        Scheduled Meds: . enoxaparin (LOVENOX) injection  40 mg Subcutaneous Q24H  . ipratropium-albuterol  3 mL Nebulization Q4H  . nicotine  14 mg Transdermal Daily   Continuous Infusions: . sodium chloride 75 mL/hr at 02/20/18 0343     LOS: 1 day    Time spent: 35 mins    Ankit Joline Maxcyhirag Amin, MD [facility] Pager [phone]   If 7PM-7AM, please contact night-coverage [email or web address] Password [email or web address] 02/20/2018, 11:06 AM

## 2018-02-20 NOTE — Progress Notes (Signed)
Initial Nutrition Assessment  DOCUMENTATION CODES:   Severe malnutrition in context of chronic illness, Underweight  INTERVENTION:    Boost Breeze po TID, each supplement provides 250 kcal and 9 grams of protein  NUTRITION DIAGNOSIS:   Severe Malnutrition related to chronic illness(COPD) as evidenced by energy intake < 75% for > or equal to 1 month, severe fat depletion, severe muscle depletion  GOAL:   Patient will meet greater than or equal to 90% of their needs  MONITOR:   PO intake, Supplement acceptance, Labs, Skin, Weight trends, I & O's  REASON FOR ASSESSMENT:   Consult Assessment of nutrition requirement/status  ASSESSMENT:   47 yo Female with PMH of depression, COPD, tobacco use but recently quit comes to the hospital complains of shortness of breath.  She was admitted for COPD exacerbation.  RD spoke with pt. She is in her chair. Lunch on tray table. Pt reports her appetite is good. PO intake 100% per flowsheet records. She typically consumes 1 meal per day and grazes during the rest of the day.  Dinner: meat, starch, veggie Snacks: leftovers, peanuts, dried cranberries, toast with cream cheese  States she's been struggling to maintain and/or gain weight for 3-4 years. Also reveals she has a prescription for nutrition supplements, however, hasn't filled it. Pt expresses financial barriers to food access.  Boost Breeze supplement ordered. She is willing to try. Labs reviewed. WBC 14.0 (H). Hgb 11.3 (L). Medications reviewed.  NUTRITION - FOCUSED PHYSICAL EXAM:    Most Recent Value  Orbital Region  Mild depletion  Upper Arm Region  Moderate depletion  Thoracic and Lumbar Region  Severe depletion  Buccal Region  Mild depletion  Temple Region  Moderate depletion  Clavicle Bone Region  Moderate depletion  Clavicle and Acromion Bone Region  Severe depletion  Scapular Bone Region  Severe depletion  Dorsal Hand  Moderate depletion  Patellar Region   Moderate depletion  Anterior Thigh Region  Severe depletion  Posterior Calf Region  Moderate depletion  Edema (RD Assessment)  None     Diet Order:  Diet Heart Room service appropriate? Yes; Fluid consistency: Thin  EDUCATION NEEDS:   Not appropriate for education at this time  Skin:  Skin Assessment: Reviewed RN Assessment  Last BM:  3/16   Intake/Output Summary (Last 24 hours) at 02/20/2018 1414 Last data filed at 02/20/2018 0900 Gross per 24 hour  Intake 1076.25 ml  Output -  Net 1076.25 ml   Height:   Ht Readings from Last 1 Encounters:  02/19/18 5\' 8"  (1.727 m)   Weight:   Wt Readings from Last 1 Encounters:  02/19/18 102 lb 4.7 oz (46.4 kg)   Wt Readings from Last 10 Encounters:  02/19/18 102 lb 4.7 oz (46.4 kg)  02/07/18 104 lb 6.4 oz (47.4 kg)  01/29/18 110 lb 10.7 oz (50.2 kg)   Ideal Body Weight:  63.6 kg  BMI:  Body mass index is 15.55 kg/m.  Estimated Nutritional Needs:   Kcal:  1700-1900  Protein:  80-95 gm  Fluid:  1.7-1.9 L  Maureen ChattersKatie Tiarrah Saville, RD, LDN Pager #: [phone] After-Hours Pager #: [phone]

## 2018-02-20 NOTE — Progress Notes (Signed)
SATURATION QUALIFICATIONS: (This note is used to comply with regulatory documentation for home oxygen)  Patient Saturations on Room Air at Rest = 87%  Patient Saturations on Room Air while Ambulating = 85%  Patient Saturations on 2 Liters of oxygen while Ambulating = 89%  Please briefly explain why patient needs home oxygen:Pt requires O2 at rest and with ambulation to keep sats above 88%.  Will need home O2.  Instructed in use of incentive spirometer.  Thanks. [facility] Hospital[facility]wn Nickie Deren,PT Acute Rehabilitation [phone] (pager)

## 2018-02-20 NOTE — Care Management Note (Addendum)
Case Management Note Per Previous note: Oletta CohnWood, Camellia, RN 02/19/2018, 2:30 PM  Patient Details  Name: Elizabeth Villarreal MRN: [id number] Date of Birth: 11/14/1971  Subjective/Objective:                  47 year old female with past medical history of COPD presenting for evaluation of shortness of breath. From home with significant other and children.  Action/Plan: Admit status INPATIENT (COPD); anticipate discharge HOME WITH HOME HEALTH.  Expected Discharge Date:  (unknown)               Expected Discharge Plan:  Home w Home Health Services  Discharge planning Services  CM Consult  Status of Service:  In process, will continue to follow  If discussed at Long Length of Stay Meetings, dates discussed:    Additional Comments: [facility] Dr Delford FieldWright for COPD sx and LCSW Ulla GalloJamine Lewis for depression. --------   Case Management Note  Patient Details  Name: Elizabeth Villarreal MRN: [id number] Date of Birth: 11/14/1971  Action/Plan: In to speak with patient.  Prior to admission patient lived at home with significant other/children. Will be returning to the same living situation after discharge.   At discharge, patient has transportation home.  Home DME: nebulizer. Discussed recommendation for Home O2 and patient agrees.  [facility] Lupita LeashDonna to access if patient meets to receive as charity.  Discussed recommendation for Physical therapy and patient states she has not established PCP yet. Has been seen by Dr. Shan LevansPatrick Wright and is suppose to make another appointment to establish PCP.  Since patient does not have PCP yet; Physical therapy orders for Outpatient rehab entered.  Notified patient of outpatient rehab referral (included address/phone number on referral), she verbalized understanding.  When patient discharges she will be at: [address], [address]; [place], [place] [address]. Phone: [phone].   Expected Discharge Date:  (unknown)                Expected Discharge Plan:  OP Rehab  Discharge planning Services  CM Consult  Post Acute Care Choice:    Choice offered to:  Patient  DME Arranged:   [facility]    Outpatient Arranged:   PT Outpatient Facilty: [facility]     Status of Service:  In process, will continue to follow  Nile DearIKimberly R Enas Winchel, RN  Nurse case manager [place] 02/20/2018, 2:45 PM

## 2018-02-21 ENCOUNTER — Encounter: Payer: Self-pay | Admitting: Critical Care Medicine

## 2018-02-21 DIAGNOSIS — F329 Major depressive disorder, single episode, unspecified: Secondary | ICD-10-CM

## 2018-02-21 DIAGNOSIS — Z720 Tobacco use: Secondary | ICD-10-CM

## 2018-02-21 DIAGNOSIS — J9601 Acute respiratory failure with hypoxia: Secondary | ICD-10-CM

## 2018-02-21 DIAGNOSIS — J441 Chronic obstructive pulmonary disease with (acute) exacerbation: Principal | ICD-10-CM

## 2018-02-21 MED ORDER — AZITHROMYCIN 500 MG PO TABS: 500.0000 mg | ORAL_TABLET | Freq: Once | ORAL | 0 refills | Status: AC

## 2018-02-21 MED ORDER — PREDNISONE 10 MG PO TABS: ORAL_TABLET | ORAL | 0 refills | Status: DC

## 2018-02-21 MED ORDER — PREDNISONE 20 MG PO TABS
30.0000 mg | ORAL_TABLET | Freq: Once | ORAL | Status: AC
  Administered 2018-02-21: 30 mg via ORAL
  Filled 2018-02-21: qty 1

## 2018-02-21 MED ORDER — NICOTINE 14 MG/24HR TD PT24: 14.0000 mg | MEDICATED_PATCH | Freq: Every day | TRANSDERMAL | 0 refills | Status: DC

## 2018-02-21 NOTE — Progress Notes (Signed)
SATURATION QUALIFICATIONS: (This note is used to comply with regulatory documentation for home oxygen)  Patient Saturations on Room Air at Rest = 96%  Patient Saturations on Room Air while Ambulating = 95%  Patient Saturations on 2 Liters of oxygen while Ambulating = 97%  Please briefly explain why patient needs home oxygen:

## 2018-02-21 NOTE — Progress Notes (Addendum)
NCM spoke with patient informed her about her hospital [facility] she [facility] get medication ast also, RN Venita SheffieldGladys gave her the brochure.  She will need to be checked also for ambulation sats to see if she needs home oxygen set up.  NCM awaiting to get this information [facility].  She will need to be discharged before 5 pm the clinic closes at 5 pm. Per Venita SheffieldGladys, she does not need home oxygen.  NCM notified Lupita LeashDonna [facility] information.  Also [facility]

## 2018-02-21 NOTE — Progress Notes (Signed)
This RN has reviewed discharge packet with the patient. The daughter was at the bedside at this time. Patient was educated about med changes and follow up appointments. The patient stated that she had no further questions related to her care.

## 2018-02-21 NOTE — Discharge Instructions (Signed)
Please get your medications reviewed and adjusted by your Primary MD. ° °Please request your Primary MD to go over all Hospital Tests and Procedure/Radiological results at the follow up, please get all Hospital records sent to your Prim MD by signing hospital release before you go home. ° °If you had Pneumonia of Lung problems at the Hospital: °Please get a 2 view Chest X ray done in 6-8 weeks after hospital discharge or sooner if instructed by your Primary MD. ° °If you have Congestive Heart Failure: °Please call your Cardiologist or Primary MD anytime you have any of the following symptoms:  °1) 3 pound weight gain in 24 hours or 5 pounds in 1 week  °2) shortness of breath, with or without a dry hacking cough  °3) swelling in the hands, feet or stomach  °4) if you have to sleep on extra pillows at night in order to breathe ° °Follow cardiac low salt diet and 1.5 lit/day fluid restriction. ° °If you have diabetes °Accuchecks 4 times/day, Once in AM empty stomach and then before each meal. °Log in all results and show them to your primary doctor at your next visit. °If any glucose reading is under 80 or above 300 call your primary MD immediately. ° °If you have Seizure/Convulsions/Epilepsy: °Please do not drive, operate heavy machinery, participate in activities at heights or participate in high speed sports until you have seen by Primary MD or a Neurologist and advised to do so again. ° °If you had Gastrointestinal Bleeding: °Please ask your Primary MD to check a complete blood count within one week of discharge or at your next visit. Your endoscopic/colonoscopic biopsies that are pending at the time of discharge, will also need to followed by your Primary MD. ° °Get Medicines reviewed and adjusted. °Please take all your medications with you for your next visit with your Primary MD ° °Please request your Primary MD to go over all hospital tests and procedure/radiological results at the follow up, please ask your  Primary MD to get all Hospital records sent to his/her office. ° °If you experience worsening of your admission symptoms, develop shortness of breath, life threatening emergency, suicidal or homicidal thoughts you must seek medical attention immediately by calling 911 or calling your MD immediately  if symptoms less severe. ° °You must read complete instructions/literature along with all the possible adverse reactions/side effects for all the Medicines you take and that have been prescribed to you. Take any new Medicines after you have completely understood and accpet all the possible adverse reactions/side effects.  ° °Do not drive or operate heavy machinery when taking Pain medications.  ° °Do not take more than prescribed Pain, Sleep and Anxiety Medications ° °Special Instructions: If you have smoked or chewed Tobacco  in the last 2 yrs please stop smoking, stop any regular Alcohol  and or any Recreational drug use. ° °Wear Seat belts while driving. ° °Please note °You were cared for by a hospitalist during your hospital stay. If you have any questions about your discharge medications or the care you received while you were in the hospital after you are discharged, you can call the unit and asked to speak with the hospitalist on call if the hospitalist that took care of you is not available. Once you are discharged, your primary care physician will handle any further medical issues. Please note that NO REFILLS for any discharge medications will be authorized once you are discharged, as it is imperative that you   return to your primary care physician (or establish a relationship with a primary care physician if you do not have one) for your aftercare needs so that they can reassess your need for medications and monitor your lab values. ° °You can reach the hospitalist office at phone [phone] or fax [phone] °  °If you do not have a primary care physician, you can call [phone] for a physician  referral. ° ° °Chronic Obstructive Pulmonary Disease Exacerbation °Chronic obstructive pulmonary disease (COPD) is a common lung problem. In COPD, the flow of air from the lungs is limited. COPD exacerbations are times that breathing gets worse and you need extra treatment. Without treatment they can be life threatening. If they happen often, your lungs can become more damaged. If your COPD gets worse, your doctor may treat you with: °· Medicines. °· Oxygen. °· Different ways to clear your airway, such as using a mask. ° °Follow these instructions at home: °· Do not smoke. °· Avoid tobacco smoke and other things that bother your lungs. °· If given, take your antibiotic medicine as told. Finish the medicine even if you start to feel better. °· Only take medicines as told by your doctor. °· Drink enough fluids to keep your pee (urine) clear or pale yellow (unless your doctor has told you not to). °· Use a cool mist machine (vaporizer). °· If you use oxygen or a machine that turns liquid medicine into a mist (nebulizer), continue to use them as told. °· Keep up with shots (vaccinations) as told by your doctor. °· Exercise regularly. °· Eat healthy foods. °· Keep all doctor visits as told. °Get help right away if: °· You are very short of breath and it gets worse. °· You have trouble talking. °· You have bad chest pain. °· You have blood in your spit (sputum). °· You have a fever. °· You keep throwing up (vomiting). °· You feel weak, or you pass out (faint). °· You feel confused. °· You keep getting worse. °This information is not intended to replace advice given to you by your health care provider. Make sure you discuss any questions you have with your health care provider. °Document Released: 11/10/2011 Document Revised: 04/28/2016 Document Reviewed: 07/26/2013 °Elsevier Interactive Patient Education © 2017 [employer] ° °

## 2018-02-21 NOTE — Discharge Summary (Signed)
Physician Discharge Summary  Elizabeth Villarreal ZOX:[id number] DOB: 07-Jan-1971  PCP: System, Pcp Not In  Admit date: 02/19/2018 Discharge date: 02/21/2018  Recommendations for Outpatient Follow-up:  1. Dr. Shan Levans, PCP at [facility] on 03/07/18 at 8:30 AM.  To be seen with repeat labs (CBC & BMP).  Home Health: PT.  As per report, patient's daughter and other family will be with patient 24/7 to assist. Equipment/Devices: None  Discharge Condition: Improved and stable. CODE STATUS: Full Diet recommendation: Heart healthy diet.  Discharge Diagnoses:  Principal Problem:   COPD with acute bronchitis (HCC) GOLD D  Active Problems:   Tobacco abuse   Depression   COPD with acute exacerbation [facility])   Brief Summary: 47 year old female with PMH of COPD, tobacco abuse-recently quit, depression, recent hospitalization end of February 2019 for COPD exacerbation, followed up with pulmonology on 3/6 and completed outpatient PFTs 3/13, presented to the ED due to difficulty breathing and chest pain.  On arrival to ED, she was tachypneic, oxygen saturation 87% on room air, chest x-ray negative for acute findings.  She was given continuous nebulizing treatment in ED with some improvement but it made her extremely anxious and was given Ativan for anxiety which made her somnolent.  She was admitted for acute hypoxic respiratory failure secondary to COPD exacerbation.  Assessment and plan:  1. COPD exacerbation: Chest x-ray showed emphysema without acute abnormality.  Recent CTA chest 01/28/18 was negative for PE but showed severe emphysema and 14 mm right lower lobe pulmonary nodule.  She was treated with oxygen, IV Solu-Medrol, azithromycin and bronchodilator nebulizations.  She clinically improved.  Not hypoxic anymore.  She will be discharged to complete total 3 days of azithromycin, prednisone taper and has outpatient follow-up with Pulmonology/PCP on 4/3 for review with  recently performed PFTs.  Tobacco cessation was reiterated.  She had atypical chest discomfort likely due to dyspnea from COPD exacerbation.  Troponins were cycled multiple times and negative.  Chest discomfort resolved. 2. Acute respiratory failure with hypoxia: Secondary to COPD exacerbation.  D-dimer negative.  Recent CTA chest negative for PE.  Treated per problem #1.  Hypoxia resolved. 3. Tobacco abuse: Cessation counseled.  Nicotine patch. 4. Normocytic anemia: Stable.  Outpatient follow-up and evaluation as deemed necessary. 5. 14 mm right lower lobe pulmonary nodule with central calcification: Seen on CTA chest 01/28/18 and as per radiologist report, compatible with healing granuloma and no routine indicated follow-up but will defer to outpatient Pulmonology regarding need for follow-up. 6. Depression: Stable without suicidal or homicidal ideations.  Apparently followed outpatient by [facility].  Not on medications PTA. 7. Severe protein calorie malnutrition: Continue outpatient nutritional supplements.   Consultations:  None  Procedures:  None   Discharge Instructions  Discharge Instructions    Call MD for:  difficulty breathing, headache or visual disturbances   Complete by:  As directed    Call MD for:  extreme fatigue   Complete by:  As directed    Call MD for:  persistant dizziness or light-headedness   Complete by:  As directed    Diet - low sodium heart healthy   Complete by:  As directed    Increase activity slowly   Complete by:  As directed        Medication List    STOP taking these medications   PAMPRIN ALL DAY RELIEF MAX ST 220 MG tablet Generic drug:  naproxen sodium   pantoprazole 40 MG tablet Commonly known  as:  PROTONIX     TAKE these medications   acetaminophen 325 MG tablet Commonly known as:  TYLENOL Take 650 mg by mouth every 6 (six) hours as needed for mild pain.   albuterol (2.5 MG/3ML) 0.083% nebulizer solution Commonly known as:   PROVENTIL Take 3 mLs (2.5 mg total) by nebulization 4 (four) times daily. With the ipratropium   albuterol 108 (90 Base) MCG/ACT inhaler Commonly known as:  PROVENTIL HFA;VENTOLIN HFA Inhale 1-2 puffs into the lungs every 6 (six) hours as needed for wheezing or shortness of breath.   azithromycin 500 MG tablet Commonly known as:  ZITHROMAX Take 1 tablet (500 mg total) by mouth once for 1 dose. Start taking on:  02/22/2018   ibuprofen 200 MG tablet Commonly known as:  ADVIL,MOTRIN Take 200 mg by mouth every 6 (six) hours as needed for moderate pain.   ipratropium 0.02 % nebulizer solution Commonly known as:  ATROVENT Take 2.5 mLs (0.5 mg total) by nebulization 4 (four) times daily.   nicotine 14 mg/24hr patch Commonly known as:  NICODERM CQ - dosed in mg/24 hours Place 1 patch (14 mg total) onto the skin daily. Start taking on:  02/22/2018   ONE-A-DAY WOMENS FORMULA PO Take 1 tablet by mouth daily.   predniSONE 10 MG tablet Commonly known as:  DELTASONE Take 3 tabs daily for 2 days, then 2 tabs daily for 2 days, then 1 tab daily for 2 days, then stop. Start taking on:  02/22/2018 What changed:  additional instructions      Follow-up Information    [facility] Follow up.   Specialty:  Rehabilitation Why:  Will call you to set up appointment Contact information: [address] [id number] m[place] [address] [phone]        [facility]. Call on 03/07/2018.   Why:  8:30 for hospital follow up apt.  To be seen with repeat labs (CBC & BMP). Contact information: [address] [place] [address] [phone]         Allergies  Allergen Reactions  . Darvon [Propoxyphene] Other (See Comments)    Patient was aware her spoken words were not matching her thoughts  . Percocet [Oxycodone-Acetaminophen] Other (See Comments)    Patient was aware her spoken words  were not matching her thoughts      Procedures/Studies: Dg Chest 2 View  Result Date: 01/28/2018 CLINICAL DATA:  Rib pain, productive cough EXAM: CHEST  2 VIEW COMPARISON:  10/03/2017 FINDINGS: Severe COPD changes. Heart and mediastinal contours are within normal limits. No focal opacities or effusions. No acute bony abnormality. IMPRESSION: Severe COPD.  No active cardiopulmonary disease. Electronically Signed   By: Charlett Nose M.D.   On: 01/28/2018 20:41   Ct Angio Chest Pe W And/or Wo Contrast  Result Date: 01/28/2018 CLINICAL DATA:  Worsening RIGHT rib pain radiating to LEFT back. Shortness of breath. History of COPD. EXAM: CT ANGIOGRAPHY CHEST WITH CONTRAST TECHNIQUE: Multidetector CT imaging of the chest was performed using the standard protocol during bolus administration of intravenous contrast. Multiplanar CT image reconstructions and MIPs were obtained to evaluate the vascular anatomy. CONTRAST:  ISOVUE-370 IOPAMIDOL (ISOVUE-370) INJECTION 76% COMPARISON:  Chest radiograph January 28, 2018 FINDINGS: CARDIOVASCULAR: Adequate contrast opacification of the pulmonary artery's. Main pulmonary artery is not enlarged. No pulmonary arterial filling defects to the level of the subsegmental branches. Heart size is normal, no right heart strain. No pericardial effusion. Thoracic  aorta is normal course and caliber, unremarkable. MEDIASTINUM/NODES: No lymphadenopathy by CT size criteria. LUNGS/PLEURA: Debris layering within trachea and bilateral mainstem bronchi. Bronchial wall thickening. Severe centrilobular emphysema most apparent within lung apices. No pleural effusion or focal consolidation. Lobulated 15 mm solid nodule superior segment RIGHT lower lobe with calcification (68/157). Lingular scarring. UPPER ABDOMEN: Trace calcific atherosclerosis abdominal aorta. MUSCULOSKELETAL: Nonacute. Patient is cachectic. Centered thoracic kyphosis with upper thoracic mild spondylosis. Review of the MIP  images confirms the above findings. IMPRESSION: 1. No acute pulmonary embolism. 2. Debris within trachea and bronchi concerning for aspiration. 3. Severe emphysema. 4. 14 mm RIGHT lower lobe pulmonary nodule with central calcification most compatible with healing granuloma. No routine indicated follow-up by consensus. Emphysema (ICD10-J43.9). Electronically Signed   By: Awilda Metro M.D.   On: 01/28/2018 23:23   Dg Chest Port 1 View  Result Date: 02/19/2018 CLINICAL DATA:  Shortness of breath. EXAM: PORTABLE CHEST 1 VIEW COMPARISON:  Radiographs and CT 01/28/2018 FINDINGS: Chronic hyperinflation and emphysema. Unchanged heart size and mediastinal contours. No consolidation, pleural effusion or pneumothorax. Pulmonary nodule on prior CT is not well seen radiographically. Normal pulmonary vasculature. No acute osseous abnormality. IMPRESSION: Emphysema without acute abnormality. Electronically Signed   By: Rubye Oaks M.D.   On: 02/19/2018 05:32      Subjective: Patient states that she feels much better compared to admission.  Denies dyspnea and indicates that her breathing is back to baseline.  Intermittent minimal dry cough.  No chest pain reported.  As per RN, no acute issues noted.  Discharge Exam:  Vitals:   02/21/18 0620 02/21/18 0805 02/21/18 1151 02/21/18 1417  BP: (!) 90/51   (!) 96/57  Pulse: 80   85  Resp:    18  Temp: 98.4 F (36.9 C)   98.4 F (36.9 C)  TempSrc: Oral   Oral  SpO2: 93% 92% 93% 99%  Weight:      Height:        General: Pleasant young female, moderately built and thinly nourished, sitting up comfortably in chair this morning. Cardiovascular: S1 & S2 heard, RRR, S1/S2 +. No murmurs, rubs, gallops or clicks. No JVD or pedal edema.  Telemetry personally reviewed: Sinus rhythm. Respiratory: Clear to auscultation without wheezing, rhonchi or crackles. No increased work of breathing.  Able to speak in full sentences without difficulty. Abdominal:  Non  distended, non tender & soft. No organomegaly or masses appreciated. Normal bowel sounds heard. CNS: Alert and oriented. No focal deficits. Extremities: no edema, no cyanosis    The results of significant diagnostics from this hospitalization (including imaging, microbiology, ancillary and laboratory) are listed below for reference.      Labs: CBC: Recent Labs  Lab 02/19/18 0121 02/19/18 1539 02/20/18 0341  WBC 9.8 7.5 14.0*  HGB 13.2 11.7* 11.3*  HCT 41.1 37.0 36.0  MCV 88.4 88.3 88.9  PLT 423* 381 377   Basic Metabolic Panel: Recent Labs  Lab 02/19/18 0121 02/19/18 1539 02/20/18 0341  NA 138  --  136  K 3.9  --  4.3  CL 104  --  104  CO2 27  --  24  GLUCOSE 133*  --  131*  BUN 10  --  14  CREATININE 0.65 0.66 0.51  CALCIUM 9.1  --  9.2   Cardiac Enzymes: Recent Labs  Lab 02/19/18 0620 02/19/18 1152 02/19/18 1539 02/19/18 1622 02/19/18 1941  TROPONINI <0.03 <0.03 <0.03 <0.03 <0.03      Time  coordinating discharge: Over 30 minutes  SIGNED:  Marcellus ScottAnand Kamiya Acord, MD, FACP, [facility]. [facility] Pager [phone]  If 7PM-7AM, please contact night-coverage [email or web address] Password [email or web address] 02/21/2018, 3:47 PM

## 2018-02-21 NOTE — Progress Notes (Signed)
Physical Therapy Treatment Patient Details Name: Elizabeth AsaMary Villarreal MRN: [id number] DOB: 1971-03-16 Today's Date: 02/21/2018    History of Present Illness Elizabeth Villarreal is a 47 y.o. female with a Past Medical History of  depression, COPD, and tobacco dependence who presents with SOB and chest pain.  She was admitted for COPD exacerbation 3 weeks ago and returns with SOB.  Admit for COPD exacerbation.     PT Comments    Pt admitted with above diagnosis. Pt currently with functional limitations due to endurance deficits. Pt was able to ambulate and sats stayed above 90% entire walk and up and down 3 flights of stairs.  Once back to room, pt desat to 84% once she sat down but quickly with pursed lip breathing up to 93%.  Discussed need for multiple frequent rest breaks and energy conservation with pt.   Pt will benefit from skilled PT to increase their independence and safety with mobility to allow discharge to the venue listed below.     Follow Up Recommendations  Home health PT;Supervision/Assistance - 24 hour(Possibly pulmonary rehab consult?)     Equipment Recommendations  Other (comment)(TBA, home O2)    Recommendations for Other Services       Precautions / Restrictions Precautions Precautions: Other (comment)(watch O2 sats) Restrictions Weight Bearing Restrictions: No    Mobility  Bed Mobility Overal bed mobility: Independent                Transfers Overall transfer level: Independent                  Ambulation/Gait Ambulation/Gait assistance: Supervision Ambulation Distance (Feet): 390 Feet Assistive device: None Gait Pattern/deviations: Step-through pattern;Decreased stride length   Gait velocity interpretation: Below normal speed for age/gender General Gait Details: Pt was able to ambulate in halls without assist.  Desat to 84% after pt walked in hallway and went up and down 3 flights of stairs.  With rest breaks, pt kept sats up above 90%.      Stairs Stairs: Yes   Stair Management: Forwards;Alternating pattern;One rail Left Number of Stairs: 36 General stair comments: Pt safe on stairs and did not desat with pursed lip breathing.  Pt also had several standing resting breaks.    Wheelchair Mobility    Modified Rankin (Stroke Patients Only)       Balance Overall balance assessment: Needs assistance Sitting-balance support: No upper extremity supported;Feet supported Sitting balance-Leahy Scale: Good     Standing balance support: No upper extremity supported;During functional activity Standing balance-Leahy Scale: Fair Standing balance comment: does not lose balance with static or dynamic activities                            Cognition Arousal/Alertness: Awake/alert Behavior During Therapy: WFL for tasks assessed/performed Overall Cognitive Status: Within Functional Limits for tasks assessed                                        Exercises      General Comments        Pertinent Vitals/Pain Pain Assessment: No/denies pain    Home Living                      Prior Function            PT Goals (current goals can now be  found in the care plan section) Acute Rehab PT Goals Patient Stated Goal: to get help with managing her financial situation/healthcare Progress towards PT goals: Progressing toward goals    Frequency    Min 3X/week      PT Plan Current plan remains appropriate    Co-evaluation              AM-PAC PT "6 Clicks" Daily Activity  Outcome Measure  Difficulty turning over in bed (including adjusting bedclothes, sheets and blankets)?: None Difficulty moving from lying on back to sitting on the side of the bed? : None Difficulty sitting down on and standing up from a chair with arms (e.g., wheelchair, bedside commode, etc,.)?: None Help needed moving to and from a bed to chair (including a wheelchair)?: None Help needed walking in  hospital room?: A Little Help needed climbing 3-5 steps with a railing? : A Little 6 Click Score: 22    End of Session Equipment Utilized During Treatment: Gait belt;Oxygen Activity Tolerance: Patient limited by fatigue Patient left: in chair;with call bell/phone within reach;with chair alarm set Nurse Communication: Mobility status PT Visit Diagnosis: Muscle weakness (generalized) (M62.81);Other abnormalities of gait and mobility (R26.89)     Time: 1051-1105 PT Time Calculation (min) (ACUTE ONLY): 14 min  Charges:  $Gait Training: 8-22 mins                    G Codes:       Matrice Herro,PT Acute Rehabilitation [phone] (pager)    Berline Lopes 02/21/2018, 11:52 AM

## 2018-02-22 MED FILL — predniSONE 10 MG TABS: 10 | 6 days supply | Qty: 12 | Fill #0

## 2018-02-22 MED FILL — AZITHROMYCIN 500 MG TABLET: 500 | 1 days supply | Qty: 1 | Fill #0

## 2018-02-22 MED FILL — ALBUTEROL 0.083% INHAL SOLN: (2.5 MG/3ML | 5 days supply | Qty: 90 | Fill #1

## 2018-02-22 MED FILL — NICOTINE 14 MG/24HR PATCH: 14 | 28 days supply | Qty: 28 | Fill #0

## 2018-02-27 ENCOUNTER — Other Ambulatory Visit: Payer: Self-pay | Admitting: Pharmacist

## 2018-02-27 MED ORDER — ALBUTEROL SULFATE HFA 108 (90 BASE) MCG/ACT IN AERS: 1.0000 | INHALATION_SPRAY | Freq: Four times a day (QID) | RESPIRATORY_TRACT | 3 refills | Status: DC | PRN

## 2018-02-27 MED FILL — IPRATROPIUM BR 0.02% SOLN: 0.02 | 12 days supply | Qty: 313 | Fill #1

## 2018-02-27 MED FILL — PROAIR HFA 90 MCG INHALER: 108 (90 BAS | 25 days supply | Qty: 9 | Fill #0

## 2018-03-06 MED FILL — ALBUTEROL 0.083% INHAL SOLN: (2.5 MG/3ML | 7 days supply | Qty: 90 | Fill #2

## 2018-03-07 ENCOUNTER — Telehealth: Payer: Self-pay

## 2018-03-07 ENCOUNTER — Ambulatory Visit: Payer: Medicaid Other | Attending: Critical Care Medicine | Admitting: Critical Care Medicine

## 2018-03-07 ENCOUNTER — Other Ambulatory Visit: Payer: Self-pay

## 2018-03-07 ENCOUNTER — Encounter: Payer: Self-pay | Admitting: Critical Care Medicine

## 2018-03-07 VITALS — BP 115/77 | HR 97 | Temp 98.0°F | Resp 18 | Ht 67.0 in | Wt 108.4 lb

## 2018-03-07 DIAGNOSIS — E43 Unspecified severe protein-calorie malnutrition: Secondary | ICD-10-CM | POA: Diagnosis not present

## 2018-03-07 DIAGNOSIS — F32A Depression, unspecified: Secondary | ICD-10-CM

## 2018-03-07 DIAGNOSIS — J440 Chronic obstructive pulmonary disease with acute lower respiratory infection: Secondary | ICD-10-CM

## 2018-03-07 DIAGNOSIS — Z79899 Other long term (current) drug therapy: Secondary | ICD-10-CM | POA: Diagnosis not present

## 2018-03-07 DIAGNOSIS — F329 Major depressive disorder, single episode, unspecified: Secondary | ICD-10-CM | POA: Insufficient documentation

## 2018-03-07 DIAGNOSIS — J9611 Chronic respiratory failure with hypoxia: Secondary | ICD-10-CM | POA: Diagnosis not present

## 2018-03-07 DIAGNOSIS — F419 Anxiety disorder, unspecified: Secondary | ICD-10-CM | POA: Diagnosis not present

## 2018-03-07 DIAGNOSIS — J441 Chronic obstructive pulmonary disease with (acute) exacerbation: Secondary | ICD-10-CM | POA: Diagnosis present

## 2018-03-07 DIAGNOSIS — R911 Solitary pulmonary nodule: Secondary | ICD-10-CM | POA: Insufficient documentation

## 2018-03-07 DIAGNOSIS — J209 Acute bronchitis, unspecified: Secondary | ICD-10-CM | POA: Diagnosis not present

## 2018-03-07 DIAGNOSIS — Z87891 Personal history of nicotine dependence: Secondary | ICD-10-CM | POA: Diagnosis not present

## 2018-03-07 MED ORDER — PREDNISONE 10 MG PO TABS: ORAL_TABLET | ORAL | 0 refills | Status: DC

## 2018-03-07 MED ORDER — LEVOFLOXACIN 500 MG PO TABS: 500.0000 mg | ORAL_TABLET | Freq: Every day | ORAL | 0 refills | Status: DC

## 2018-03-07 MED FILL — levoFLOXacin 500 MG TABS: 500 | 5 days supply | Qty: 5 | Fill #0

## 2018-03-07 MED FILL — predniSONE 10 MG TABS: 10 | 12 days supply | Qty: 30 | Fill #0

## 2018-03-07 NOTE — Assessment & Plan Note (Signed)
Recurrent Copd exacerbation with associated anxiety component Plan  Repulse prednisone 10mg  Take 4 for three days 3 for three days 2 for three days 1 for three days and stop levaquin 500mg  daily for 5days Cont neb meds qid albuterol/ipratroprium Prn HFA saba Obtain ONO on RA

## 2018-03-07 NOTE — Telephone Encounter (Signed)
Pt came in to request information about a shower stool and a handicapped Placecard she waill

## 2018-03-07 NOTE — Assessment & Plan Note (Signed)
RLL benign lung nodule  observation

## 2018-03-07 NOTE — Assessment & Plan Note (Signed)
Continue nutritional supplement

## 2018-03-07 NOTE — Assessment & Plan Note (Signed)
Prior tobacco use, no smoking since 01/2018

## 2018-03-07 NOTE — Patient Instructions (Addendum)
Start Levaquin 500mg  daily for 5 days Start Prednisone 10mg  Take 4 for three days 3 for three days 2 for three days 1 for three days and stop Continue nebulizer 4 times daily Use albuterol inhaler as needed An overnight oxygen test in your home will be obtained Return May 8th for follow up, I will call with oxygen results

## 2018-03-07 NOTE — Progress Notes (Signed)
Patient complains shortness  Of breathe

## 2018-03-07 NOTE — Progress Notes (Signed)
Subjective:    Patient ID: Elizabeth Villarreal, female    DOB: 03-14-1971, 47 y.o.   MRN: [id number]  46 y.o.F recent admission 2/24 - 2/27 for Copd exacerbation.  Here for post hospital f/u Pt dx with PNA 09/2017, then was dx with COPD.  Pt had a mother with COPD and she recently expired. Hx of dyspnea for several years.  Sx came on insideously, thought was anxiety,    Home environment is not good.  Was caring for a son of a partner and now son wants her evicted.  Share a biologic son same address   03/07/2018 Pt was readmitted 3/18- 3/20.    rx steroids and ABX and nebs PFTs Gold D Copd  Shortness of Breath  This is a chronic problem. The current episode started more than 1 year ago. The problem occurs constantly (pt with DOE, up stairs, lifting heavy objexts). The problem has been gradually worsening. Associated symptoms include chest pain, PND, sputum production and wheezing. Pertinent negatives include no abdominal pain, headaches, hemoptysis, leg pain, leg swelling, orthopnea, rash, sore throat or swollen glands. The symptoms are aggravated by odors, weather changes, URIs, lying flat, any activity, exercise and fumes. Associated symptoms comments: Mucus now is green gray.   Nasal issues Mucus is thick. Risk factors include smoking. Her past medical history is significant for COPD. There is no history of PE.      No PFTs ever done  Past Medical History:  Diagnosis Date  . COPD (chronic obstructive pulmonary disease) (HCC)   . Depression   . Tobacco abuse      Family History  Problem Relation Age of Onset  . COPD Mother   . Lung cancer Father      Social History   Socioeconomic History  . Marital status: Single    Spouse name: Not on file  . Number of children: Not on file  . Years of education: Not on file  . Highest education level: Not on file  Occupational History  . Not on file  Social Needs  . Financial resource strain: Not on file  . Food insecurity:    Worry:  Not on file    Inability: Not on file  . Transportation needs:    Medical: Not on file    Non-medical: Not on file  Tobacco Use  . Smoking status: Former Smoker    Packs/day: 1.00    Types: Cigarettes  . Smokeless tobacco: Never Used  . Tobacco comment: last cigarette 12 days ago  Substance and Sexual Activity  . Alcohol use: No  . Drug use: No  . Sexual activity: Not Currently    Birth control/protection: None  Lifestyle  . Physical activity:    Days per week: Not on file    Minutes per session: Not on file  . Stress: Not on file  Relationships  . Social connections:    Talks on phone: Not on file    Gets together: Not on file    Attends religious service: Not on file    Active member of club or organization: Not on file    Attends meetings of clubs or organizations: Not on file    Relationship status: Not on file  . Intimate partner violence:    Fear of current or ex partner: Not on file    Emotionally abused: Not on file    Physically abused: Not on file    Forced sexual activity: Not on file  Other Topics Concern  .  Not on file  Social History Narrative  . Not on file     Allergies  Allergen Reactions  . Darvon [Propoxyphene] Other (See Comments)    Patient was aware her spoken words were not matching her thoughts  . Percocet [Oxycodone-Acetaminophen] Other (See Comments)    Patient was aware her spoken words were not matching her thoughts     Outpatient Medications Prior to Visit  Medication Sig Dispense Refill  . acetaminophen (TYLENOL) 325 MG tablet Take 650 mg by mouth every 6 (six) hours as needed for mild pain.    Marland Kitchen. albuterol (PROAIR HFA) 108 (90 Base) MCG/ACT inhaler Inhale 1-2 puffs into the lungs every 6 (six) hours as needed for wheezing or shortness of breath. 1 Inhaler 3  . albuterol (PROVENTIL) (2.5 MG/3ML) 0.083% nebulizer solution Take 3 mLs (2.5 mg total) by nebulization 4 (four) times daily. With the ipratropium 120 mL 6  . ibuprofen  (ADVIL,MOTRIN) 200 MG tablet Take 200 mg by mouth every 6 (six) hours as needed for moderate pain.    Marland Kitchen. ipratropium (ATROVENT) 0.02 % nebulizer solution Take 2.5 mLs (0.5 mg total) by nebulization 4 (four) times daily. 120 mL 6  . Multiple Vitamins-Calcium (ONE-A-DAY WOMENS FORMULA PO) Take 1 tablet by mouth daily.     . nicotine (NICODERM CQ - DOSED IN MG/24 HOURS) 14 mg/24hr patch Place 1 patch (14 mg total) onto the skin daily. (Patient not taking: Reported on 03/07/2018) 28 patch 0  . predniSONE (DELTASONE) 10 MG tablet Take 3 tabs daily for 2 days, then 2 tabs daily for 2 days, then 1 tab daily for 2 days, then stop. (Patient not taking: Reported on 03/07/2018) 12 tablet 0   No facility-administered medications prior to visit.      Review of Systems  HENT: Negative for sore throat.   Respiratory: Positive for sputum production, shortness of breath and wheezing. Negative for hemoptysis.   Cardiovascular: Positive for chest pain and PND. Negative for orthopnea and leg swelling.  Gastrointestinal: Negative for abdominal pain.  Skin: Negative for rash.  Neurological: Negative for headaches.       Objective:   Physical Exam  Vitals:   03/07/18 0845  BP: 115/77  Pulse: 97  Resp: 18  Temp: 98 F (36.7 C)  TempSrc: Oral  SpO2: 91%  Weight: 108 lb 6.4 oz (49.2 kg)  Height: 5\' 7"  (1.702 m)    Gen: Pleasant, thin, in no distress,  Anxious  affect  ENT: No lesions,  mouth clear,  oropharynx clear, no postnasal drip  Neck: No JVD, no TMG, no carotid bruits  Lungs: No use of accessory muscles, no dullness to percussion, exp wheezes, scattered rhonchi, distant BS  Cardiovascular: RRR, heart sounds normal, no murmur or gallops, no peripheral edema  Abdomen: soft and NT, no HSM,  BS normal  Musculoskeletal: No deformities, no cyanosis or clubbing  Neuro: alert, non focal  Skin: Warm, no lesions or rashes  No results found. Ct Angio: no PE.  Copd changes amb sats 03/07/2018: SpO2  down to 88% PFTs reviewed Fev1 28% post BD  DLCO 30%  TLC 147%  Labs from recent admit reviewed: benzos positive    Assessment & Plan:  I personally [facility] as well as any outside material available during this office visit and agree with the  radiology impressions.   COPD with acute exacerbation (HCC) Recurrent Copd exacerbation with associated anxiety component Plan  Repulse  prednisone 10mg  Take 4 for three days 3 for three days 2 for three days 1 for three days and stop levaquin 500mg  daily for 5days Cont neb meds qid albuterol/ipratroprium Prn HFA saba Obtain ONO on RA  COPD with acute bronchitis (HCC) GOLD D  Gold D Copd with emphyema and bronchitic component Plan See copd assessment  Chronic respiratory failure with hypoxia (HCC) Chronic hypoxia Obtain ONO on RA Note borderline desat on exertion to 88% RA today  Lung nodule seen on imaging study RLL benign lung nodule  observation  Protein-calorie malnutrition, severe (HCC) Continue nutritional supplement  History of tobacco use Prior tobacco use, no smoking since 01/2018   Aleysia was seen today for hospitalization follow-up.  Diagnoses and all orders for this visit:  COPD with acute exacerbation (HCC) -     Pulse oximetry, overnight; Future  COPD with acute bronchitis (HCC) GOLD D  -     Pulse oximetry, overnight; Future  Protein-calorie malnutrition, severe (HCC)  Depression, unspecified depression type  Chronic respiratory failure with hypoxia (HCC) -     Pulse oximetry, overnight; Future  Lung nodule seen on imaging study  History of tobacco use  Other orders -     Discontinue: predniSONE (DELTASONE) 10 MG tablet; Take 4 for three days 3 for three days 2 for three days 1 for three days and stop -     levofloxacin (LEVAQUIN) 500 MG tablet; Take 1 tablet (500 mg total) by mouth daily. -     predniSONE (DELTASONE) 10 MG tablet; Take 4 for three days 3 for  three days 2 for three days 1 for three days and stop

## 2018-03-07 NOTE — Assessment & Plan Note (Signed)
Chronic hypoxia Obtain ONO on RA Note borderline desat on exertion to 88% RA today

## 2018-03-07 NOTE — Assessment & Plan Note (Signed)
Gold D Copd with emphyema and bronchitic component Plan See copd assessment

## 2018-03-12 ENCOUNTER — Other Ambulatory Visit: Payer: Self-pay

## 2018-03-12 ENCOUNTER — Ambulatory Visit: Payer: Medicaid Other | Attending: Internal Medicine

## 2018-03-12 DIAGNOSIS — M6281 Muscle weakness (generalized): Secondary | ICD-10-CM | POA: Insufficient documentation

## 2018-03-12 DIAGNOSIS — R2689 Other abnormalities of gait and mobility: Secondary | ICD-10-CM | POA: Diagnosis present

## 2018-03-12 MED FILL — ALBUTEROL SUL 2.5 MG/3 ML S: (2.5 MG/3ML | 5 days supply | Qty: 75 | Fill #3

## 2018-03-12 NOTE — Therapy (Signed)
[facility] [address] [place], [place], [address] Phone: [phone]   Fax:  [phone]  Physical Therapy Evaluation  Patient Details  Name: Elizabeth Villarreal MRN: [id number] Date of Birth: [birth year] Referring Provider: Dr. Stephania Fragmin   Encounter Date: 03/12/2018  PT End of Session - 03/12/18 1140    Visit Number  1    Number of Visits  4    Authorization Type  Medicaid: awaiting auth    PT Start Time  1040 pt late, had wrong appt. time    PT Stop Time  1136    PT Time Calculation (min)  56 min    Equipment Utilized During Treatment  -- min guard to S prn    Activity Tolerance  Patient tolerated treatment well;Patient limited by fatigue    Behavior During Therapy  [facility] for tasks assessed/performed       Past Medical History:  Diagnosis Date  . COPD (chronic obstructive pulmonary disease) (HCC)   . Depression   . Tobacco abuse     Past Surgical History:  Procedure Laterality Date  . CESAREAN SECTION    . ECTOPIC PREGNANCY SURGERY    . ovarian cyst rupture    . ovary cyst rupture      There were no vitals filed for this visit.   Subjective Assessment - 03/12/18 1043    Subjective  Pt presenting today due to leg weakness, COPD, and decr. endurance. Pt became emotional when explaining her illness over the last few years, as well as caring for her mother (who passed). 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  Pt denied falls in the 6 months but reported near falls while traversing stairs. Pt not sure how far she can walk without becoming SOB. Pt has been in the hospital 3 times since 09/2017 2/2 COPD and PNA. Pt has difficulty during the first half of the day, as she believes she's not getting enough oxygen during sleep. Pt is now sleeping propped up in recliner. Pt has difficulty standing in the shower and has requested a shower chair.      Patient is accompained by:  Family member dtr: Elizabeth Villarreal    Pertinent History  COPD, PNA, depression, hx of smoking    Currently in Pain?  No/denies         [facility] PT Assessment - 03/12/18 1051      Assessment   Medical Diagnosis  Muscle weakness, other abnormalities during gait and mobility     Referring Provider  Dr. Stephania Fragmin    Onset Date/Surgical Date  03/12/16 pt reports malnourishment/weakness began 2 years ago    Hand Dominance  Left    Next MD Visit  04/05/18 (Dr. Laural Benes) and Dr. Delford Field (pulmonology). Pt is supposed to also have a sleep study.     Prior Therapy  none      Precautions   Precautions  Fall    Precaution Comments  based on pt's hx of near falls and reported issues amb. over uneven terrain and steps. Also, FGA score indicates pt is at moderate risk for falls.       Restrictions   Weight Bearing Restrictions  No      Balance Screen   Has the patient fallen in the past 6 months  No    Has the patient had a decrease in activity level because of a fear of falling?  Yes    Is the patient reluctant to leave their home because of a fear of falling?   No      Home Environment   Living Environment  Private residence    Living Arrangements  Children her dtr    Available Help at Discharge  Family    Type of Home  Apartment    Home Access  Stairs to enter moving to 3rd floor end of May (ground floor now)    Secretary/administrator of Steps  none now but will have 3 flights of stairs    Entrance Stairs-Rails  Right    Home Layout  One level    Home Equipment  None    Additional Comments  Pt is waiting for prescription for shower chair.       Prior Function   Level of Independence  Independent    Vocation  Other (comment) applying for disability    Leisure  On the computer a lot      Cognition   Overall Cognitive Status  Within Functional Limits for tasks assessed pt reports difficulty with memory "scattered a lot".      Sensation   Light Touch  Appears Intact     Additional Comments  Pt reported her feet become numb and cold (for years) but MD has not diagnosed pt with impairment.      Coordination   Gross Motor Movements are Fluid and Coordinated  Yes    Fine Motor Movements are Fluid and Coordinated  Yes      Posture/Postural Control   Posture/Postural Control  Postural limitations    Postural Limitations  Forward head;Rounded Shoulders;Increased thoracic kyphosis    Posture Comments  Pt stated she was dx with scoliosis but not documented in chart.       ROM / Strength   AROM / PROM / Strength  AROM;Strength      AROM   Overall AROM   Within functional limits for tasks performed    Overall AROM Comments  BUE/LE AROM WFL.      Strength   Overall Strength  Deficits    Overall Strength Comments  BLE: hips flex: 4-/5, knee ext: 4/5, knee flex: 4-/5, ankle DF: 4/5. B hip abd/add in seated position grossly: 4-/5. B hip ext not formally tested but weakness suspected 2/2 gait deviations.       Transfers   Transfers  Sit to Stand;Stand to Sit    Sit to Stand  7: Independent;Without upper extremity assist;From bed    Stand to Sit  7: Independent;Without upper extremity assist;To chair/3-in-1      Ambulation/Gait   Ambulation/Gait  Yes    Ambulation/Gait Assistance  5: Supervision    Ambulation/Gait Assistance Details  No overt LOB but pt amb. in guarded manner with the following deficits.     Ambulation Distance (Feet)  100 Feet    Assistive device  None    Gait Pattern  Step-through pattern;Decreased stride length;Decreased arm swing - right;Decreased arm swing - left;Decreased trunk rotation    Ambulation Surface  Level;Indoor    Gait velocity  4.58ft/sec.       6 Minute Walk- Baseline   6 Minute Walk- Baseline  yes    BP (mmHg)  108/79    HR (bpm)  103    02 Sat (%RA)  96 %    Modified Borg Scale for Dyspnea  0- Nothing at all    Perceived Rate of Exertion (Borg)  6-  6 Minute walk- Post Test   6 Minute Walk Post Test  yes     BP (mmHg)  102/78    HR (bpm)  105    02 Sat (%RA)  86 %    Modified Borg Scale for Dyspnea  1- Very mild shortness of breath    Perceived Rate of Exertion (Borg)  13- Somewhat hard      6 minute walk test results    Aerobic Endurance Distance Walked  1340    Endurance additional comments  Pt did not require rest breaks but had to decr. speed after first 3 minutes 2/2 fatigue      Functional Gait  Assessment   Gait assessed   Yes    Gait Level Surface  Walks 20 ft in less than 7 sec but greater than 5.5 sec, uses assistive device, slower speed, mild gait deviations, or deviates 6-10 in outside of the 12 in walkway width. 5.69sec.    Change in Gait Speed  Able to smoothly change walking speed without loss of balance or gait deviation. Deviate no more than 6 in outside of the 12 in walkway width.    Gait with Horizontal Head Turns  Performs head turns smoothly with slight change in gait velocity (eg, minor disruption to smooth gait path), deviates 6-10 in outside 12 in walkway width, or uses an assistive device.    Gait with Vertical Head Turns  Performs task with slight change in gait velocity (eg, minor disruption to smooth gait path), deviates 6 - 10 in outside 12 in walkway width or uses assistive device    Gait and Pivot Turn  Pivot turns safely within 3 sec and stops quickly with no loss of balance.    Step Over Obstacle  Is able to step over 2 stacked shoe boxes taped together (9 in total height) without changing gait speed. No evidence of imbalance.    Gait with Narrow Base of Support  Ambulates 7-9 steps. 8 steps    Gait with Eyes Closed  Walks 20 ft, no assistive devices, good speed, no evidence of imbalance, normal gait pattern, deviates no more than 6 in outside 12 in walkway width. Ambulates 20 ft in less than 7 sec. 5.81 sec.    Ambulating Backwards  Walks 20 ft, uses assistive device, slower speed, mild gait deviations, deviates 6-10 in outside 12 in walkway width.    Steps   Alternating feet, must use rail. rails to descend    Total Score  24    FGA comment:  24/30: moderate risk for falls.                 Objective measurements completed on examination: See above findings.              PT Education - 03/12/18 1140    Education provided  Yes    Education Details  PT discussed outcome measure results, PT frequency, POC and duration.     Person(s) Educated  Patient;Child(ren)    Methods  Explanation    Comprehension  Verbalized understanding       PT Short Term Goals - 03/12/18 1156      PT SHORT TERM GOAL #1   Title  Pt will be IND in HEP to improve endurance, balance, and strength. TARGET DATE FOR ALL STGS: after 3 visits    Baseline  No HEP    Status  New      PT SHORT TERM GOAL #2  Title  Pt will improve distance by 100' to improve endurance.     Baseline  1340' no AD    Status  New      PT SHORT TERM GOAL #3   Title  Trial amb. over uneven terrain and write goal as indicated.     Baseline  100' over even terrain with S to ensure safety.    Status  New      PT SHORT TERM GOAL #4   Title  Pt will amb. 1000' over even terrain with SaO2 on room air >/=92% to improve endurance and safety during functional mobility.     Baseline  1340' over even terrain with SaO2 decr. to 86%    Status  New        PT Long Term Goals - 03/12/18 1159      PT LONG TERM GOAL #1   Title  Pt will improve FGA score to >/=29/30 to decr. falls risk. TARGET DATE FOR ALL LTGS: after 11 visits    Baseline  24/30    Status  New      PT LONG TERM GOAL #2   Title  Pt will improve distance to >/=1517' to improve endurance.     Baseline  1340'    Status  New      PT LONG TERM GOAL #3   Title  Pt will be IND in progressed HEP to improve endurance, balance, and strength.     Baseline  No HEP established    Status  New      PT LONG TERM GOAL #4   Title  Pt will traverse 3 flights of steps with one handrail, no LOB, at MOD I level in  order to traverse steps at home.     Baseline  4 steps with one handrail to descend    Status  New             Plan - 03/12/18 1141    Clinical Impression Statement  Pt is a pleasant 47 y/o female presenting to OPPT neuro for muscle weakness and other abnormalities of gait and mobility. Pt with 3 hospitalizations since 09/2017 for PNA and COPD, with most recent 02/2018. Pt's PMH is significant for the following: COPD, PNA, depression, hx of smoking. The following deificts were noted upon exam: gait deviations, decr. endurance (decr. in SaO2 on room air after ), impaired strength, impaired balance, and postural dysfunction. Pt's FGA score indicates pt is at a moderate risk for falls. Pt's gait speed was WNL, however, pt reports difficulty traversing stairs and amb. over uneven terrain. Pt's vitals were WNL during PT eval, except for SaO2 on room air decr. to 86% after and incr. to >96% with seated rest break and pursed lip breating for approx. 1 minute. Pt would benefit from skilled PT to improve safety during functional mobility.     History and Personal Factors relevant to plan of care:  Pt has experienced 2 deaths of family members she was caring for, pt unable to work at this time, pt will soon be moving into a 3 story apartment (stairs are challenging for pt)    Clinical Presentation  Stable    Clinical Presentation due to:  COPD, PNA, depression, hx of smoking    Clinical Decision Making  Low    Rehab Potential  Good    Clinical Impairments Affecting Rehab Potential  see above.     PT Frequency  1x / week  PT Duration  3 weeks awaiting Medicaid auth, anticipate more visits    PT Treatment/Interventions  ADLs/Self Care Home Management;Biofeedback;Therapeutic exercise;Therapeutic activities;Manual techniques;Vestibular;Functional mobility training;Stair training;Gait training;DME Instruction;Neuromuscular re-education;Balance training;Patient/family education    PT Next Visit Plan   Establish balance, strengthenening HEP, and walking program. Monitor oxygen during session.     Consulted and Agree with Plan of Care  Patient       Patient will benefit from skilled therapeutic intervention in order to improve the following deficits and impairments:  Abnormal gait, Cardiopulmonary status limiting activity, Decreased endurance, Decreased knowledge of use of DME, Decreased strength, Decreased balance, Decreased mobility, Postural dysfunction  Visit Diagnosis: Muscle weakness (generalized) - Plan: PT plan of care cert/re-cert  Other abnormalities of gait and mobility - Plan: PT plan of care cert/re-cert     Problem List Patient Active Problem List   Diagnosis Date Noted  . Chronic respiratory failure with hypoxia (HCC) 03/07/2018  . Lung nodule seen on imaging study 03/07/2018  . COPD with acute exacerbation (HCC) 02/19/2018  . Depression 02/07/2018  . History of tobacco use 01/29/2018  . COPD with acute bronchitis (HCC) GOLD D  01/29/2018  . Protein-calorie malnutrition, severe (HCC) 01/29/2018    Darra Rosa L 03/12/2018, 12:04 PM  [facility] [address] [place], [place], [address] Phone: [phone]   Fax:  [phone]  Name: Elizabeth Villarreal MRN: [id number] Date of Birth: 01-Jun-1971   Zerita Boers, PT,DPT 03/12/18 12:04 PM Phone: [phone] Fax: [phone]

## 2018-03-19 ENCOUNTER — Encounter: Payer: Self-pay | Admitting: Critical Care Medicine

## 2018-03-19 ENCOUNTER — Encounter: Payer: Self-pay | Admitting: Rehabilitation

## 2018-03-19 ENCOUNTER — Ambulatory Visit: Payer: Medicaid Other | Admitting: Rehabilitation

## 2018-03-19 DIAGNOSIS — M6281 Muscle weakness (generalized): Secondary | ICD-10-CM | POA: Diagnosis not present

## 2018-03-19 DIAGNOSIS — R2689 Other abnormalities of gait and mobility: Secondary | ICD-10-CM

## 2018-03-19 NOTE — Patient Instructions (Signed)
Bridging (Single Leg)    Lie on back with feet shoulder width apart and right leg straight. Lift hips toward the ceiling while keeping leg straight. Hold __2-3__ seconds. Repeat __10__ times. Do __2__ sessions per day.  http://gt2.exer.us/359   Copyright  VHI. All rights reserved.   Abduction: Clam (Eccentric) - Side-Lying    Lie on side with knees bent. Lift top knee, keeping feet together. Keep trunk steady. Slowly lower for 3-5 seconds. _10__ reps per set, _2__ sets per day, _5-7__ days per week.  Add red band around your knees.    http://ecce.exer.us/65   Copyright  VHI. All rights reserved.   Pectoral Stretch, Supine on towel     Lie on back along length of a rolled up towel and allow arms to spread wide, keeping elbows straight and arms to your side with palms up.  Start with arms in "T" position and "Y" shape.  To increase stretch, reach arms over head. Hold _2__  Mins.  Do 2 times per set, 2 times per day.   Copyright  VHI. All rights reserved.     SEATED LATERAL TRUNK STRETCH  While in a seated position on couch with arm rest on your right side (place pillow if needed).  Lean over arm rest and reach left arm over body as in picture. Hold for 1 mins.  Do this as needed if you feel trunk muscles getting tight.    Anterior Reach   As you squat, hold arms out as in picture.  When you return to standing bring arms back beside you and squeeze shoulder blades.  Do in front of couch or other low surface.  Squat enough to barely touch couch and then return to standing. Do __10__ repetitions, __1__ sets.  http://bt.exer.us/89   Copyright  VHI. All rights reserved.

## 2018-03-19 NOTE — Therapy (Signed)
[facility] [address] [place], [place], [address] Phone: [phone]   Fax:  [phone]  Physical Therapy Treatment  Patient Details  Name: Elizabeth Villarreal MRN: [id number] Date of Birth: 27-Oct-1971 Referring Provider: Dr. Stephania Fragmin   Encounter Date: 03/19/2018  PT End of Session - 03/19/18 1307    Visit Number  2    Number of Visits  4    Authorization Type  Medicaid approved 3 visits from 4/15-04/08/18    Authorization - Visit Number  1    Authorization - Number of Visits  3    PT Start Time  1147    PT Stop Time  1230    PT Time Calculation (min)  43 min    Equipment Utilized During Treatment  -- min guard to S prn    Activity Tolerance  Patient tolerated treatment well;Patient limited by fatigue    Behavior During Therapy  [facility] for tasks assessed/performed       Past Medical History:  Diagnosis Date  . COPD (chronic obstructive pulmonary disease) (HCC)   . Depression   . Tobacco abuse     Past Surgical History:  Procedure Laterality Date  . CESAREAN SECTION    . ECTOPIC PREGNANCY SURGERY    . ovarian cyst rupture    . ovary cyst rupture      There were no vitals filed for this visit.  Subjective Assessment - 03/19/18 1148    Subjective  Pt reports ending antibiotic and prednisone.  No other changes, no falls.     Patient is accompained by:  Family member dtr: Elizabeth Villarreal    Pertinent History  COPD, PNA, depression, hx of smoking    Currently in Pain?  No/denies                       [facility] Adult PT Treatment/Exercise - 03/19/18 0001      Self-Care   Self-Care  Other Self-Care Comments    Other Self-Care Comments   Pt reports that she is concerned about not hearing from MD office regarding pulse oximetry testing to be done overnight to determine if she is de-sating and needs supplemental O2 at night.  She reports she tried calling Dr. Florene Route office, however online states he is at [facility]  however PT called and also found out he is no longer at this practice, but nurse provided PT with wellness clinic information.  Recommended that pt/daughter go to wellness clinic in person to see if MD can get this test scheduled before her next follow up visit.  Provided education on beginning walking program outside (pending weather and irritation from pollen) or indoors and also doing stairs at apt complex for strengthening and endurance.  Pt reports she has pulseox therefore recommend she keep this with her with all exercises/mobility and that if she drops to 88% to stop and rest with pursed lip breathing.  Pt and daughter verbalize understanding.  Recommend she begin with 4-5 min of walking initially due to decreased endurance.       Exercises   Exercises  Other Exercises    Other Exercises   See HEP for exercises added to HEP and performed during session.              PT Education - 03/19/18 1306    Education provided  Yes    Education Details  HEP, going to MD office to follow up on testing, walking program, stairs  Person(s) Educated  Patient;Child(ren)    Methods  Explanation;Demonstration;Handout    Comprehension  Verbalized understanding;Returned demonstration       PT Short Term Goals - 03/12/18 1156      PT SHORT TERM GOAL #1   Title  Pt will be IND in HEP to improve endurance, balance, and strength. TARGET DATE FOR ALL STGS: after 3 visits    Baseline  No HEP    Status  New      PT SHORT TERM GOAL #2   Title  Pt will improve 6MWT distance by 100' to improve endurance.     Baseline  1340' no AD    Status  New      PT SHORT TERM GOAL #3   Title  Trial amb. over uneven terrain and write goal as indicated.     Baseline  100' over even terrain with S to ensure safety.    Status  New      PT SHORT TERM GOAL #4   Title  Pt will amb. 1000' over even terrain with SaO2 on room air >/=92% to improve endurance and safety during functional mobility.     Baseline  1340'  over even terrain with SaO2 decr. to 86%    Status  New        PT Long Term Goals - 03/12/18 1159      PT LONG TERM GOAL #1   Title  Pt will improve FGA score to >/=29/30 to decr. falls risk. TARGET DATE FOR ALL LTGS: after 11 visits    Baseline  24/30    Status  New      PT LONG TERM GOAL #2   Title  Pt will improve 6MWT distance to >/=1517' to improve endurance.     Baseline  1340'    Status  New      PT LONG TERM GOAL #3   Title  Pt will be IND in progressed HEP to improve endurance, balance, and strength.     Baseline  No HEP established    Status  New      PT LONG TERM GOAL #4   Title  Pt will traverse 3 flights of steps with one handrail, no LOB, at MOD I level in order to traverse steps at home.     Baseline  4 steps with one handrail to descend    Status  New            Plan - 03/19/18 1308    Clinical Impression Statement  Skilled session focused on initiating HEP for BLE strengthening, discussion of walking program and stairs to improve endurance with pulse ox monitoring.  Also called MD office to try and get pt scheduled for oximetry testing,  however MD no longer at [facility] and therefore recommend she stop by Wellness clinic today to get this scheduled.      Rehab Potential  Good    Clinical Impairments Affecting Rehab Potential  see above.     PT Frequency  1x / week    PT Duration  3 weeks awaiting Medicaid auth, anticipate more visits    PT Treatment/Interventions  ADLs/Self Care Home Management;Biofeedback;Therapeutic exercise;Therapeutic activities;Manual techniques;Vestibular;Functional mobility training;Stair training;Gait training;DME Instruction;Neuromuscular re-education;Balance training;Patient/family education    PT Next Visit Plan  check compliance with HEP-add as needed, give formal walking program handout, continue to monitor O2, strength, endurance, and balance, postural exercises     Consulted and Agree with Plan of Care  Patient  Patient will benefit from skilled therapeutic intervention in order to improve the following deficits and impairments:  Abnormal gait, Cardiopulmonary status limiting activity, Decreased endurance, Decreased knowledge of use of DME, Decreased strength, Decreased balance, Decreased mobility, Postural dysfunction  Visit Diagnosis: Muscle weakness (generalized)  Other abnormalities of gait and mobility     Problem List Patient Active Problem List   Diagnosis Date Noted  . Chronic respiratory failure with hypoxia (HCC) 03/07/2018  . Lung nodule seen on imaging study 03/07/2018  . COPD with acute exacerbation (HCC) 02/19/2018  . Depression 02/07/2018  . History of tobacco use 01/29/2018  . COPD with acute bronchitis (HCC) GOLD D  01/29/2018  . Protein-calorie malnutrition, severe (HCC) 01/29/2018    Harriet Butte, PT, MPT [facility] [address] [place], [place], [address] Phone: [phone]   Fax:  [phone] 03/19/18, 1:11 PM  Name: Elizabeth Villarreal MRN: [id number] Date of Birth: [birth year]

## 2018-03-20 MED FILL — ALBUTEROL 0.083% INHAL SOLN: (2.5 MG/3ML | 6 days supply | Qty: 90 | Fill #4

## 2018-03-20 MED FILL — IPRATROPIUM BR 0.02% SOLN: 0.02 | 12 days supply | Qty: 5 | Fill #2 | Status: TO

## 2018-03-28 ENCOUNTER — Encounter (HOSPITAL_COMMUNITY): Payer: Self-pay

## 2018-03-28 ENCOUNTER — Emergency Department (HOSPITAL_COMMUNITY): Payer: Medicaid Other

## 2018-03-28 ENCOUNTER — Inpatient Hospital Stay (HOSPITAL_COMMUNITY)
Admission: EM | Admit: 2018-03-28 | Discharge: 2018-03-31 | DRG: 190 | Disposition: A | Discharge status: Home / Self Care | Payer: Medicaid Other | Attending: Internal Medicine | Admitting: Internal Medicine

## 2018-03-28 ENCOUNTER — Other Ambulatory Visit: Payer: Self-pay

## 2018-03-28 DIAGNOSIS — E440 Moderate protein-calorie malnutrition: Secondary | ICD-10-CM | POA: Diagnosis not present

## 2018-03-28 DIAGNOSIS — Z7952 Long term (current) use of systemic steroids: Secondary | ICD-10-CM

## 2018-03-28 DIAGNOSIS — J441 Chronic obstructive pulmonary disease with (acute) exacerbation: Secondary | ICD-10-CM | POA: Diagnosis present

## 2018-03-28 DIAGNOSIS — Z825 Family history of asthma and other chronic lower respiratory diseases: Secondary | ICD-10-CM

## 2018-03-28 DIAGNOSIS — E43 Unspecified severe protein-calorie malnutrition: Secondary | ICD-10-CM | POA: Diagnosis present

## 2018-03-28 DIAGNOSIS — J9602 Acute respiratory failure with hypercapnia: Secondary | ICD-10-CM | POA: Diagnosis present

## 2018-03-28 DIAGNOSIS — Z801 Family history of malignant neoplasm of trachea, bronchus and lung: Secondary | ICD-10-CM

## 2018-03-28 DIAGNOSIS — D649 Anemia, unspecified: Secondary | ICD-10-CM | POA: Diagnosis present

## 2018-03-28 DIAGNOSIS — Z79899 Other long term (current) drug therapy: Secondary | ICD-10-CM | POA: Diagnosis not present

## 2018-03-28 DIAGNOSIS — E872 Acidosis: Secondary | ICD-10-CM | POA: Diagnosis present

## 2018-03-28 DIAGNOSIS — Z87891 Personal history of nicotine dependence: Secondary | ICD-10-CM

## 2018-03-28 DIAGNOSIS — J9691 Respiratory failure, unspecified with hypoxia: Secondary | ICD-10-CM | POA: Diagnosis present

## 2018-03-28 DIAGNOSIS — J984 Other disorders of lung: Secondary | ICD-10-CM | POA: Diagnosis present

## 2018-03-28 DIAGNOSIS — J9692 Respiratory failure, unspecified with hypercapnia: Secondary | ICD-10-CM

## 2018-03-28 DIAGNOSIS — R911 Solitary pulmonary nodule: Secondary | ICD-10-CM | POA: Diagnosis present

## 2018-03-28 DIAGNOSIS — F329 Major depressive disorder, single episode, unspecified: Secondary | ICD-10-CM | POA: Diagnosis present

## 2018-03-28 DIAGNOSIS — J9601 Acute respiratory failure with hypoxia: Secondary | ICD-10-CM | POA: Diagnosis present

## 2018-03-28 DIAGNOSIS — Z888 Allergy status to other drugs, medicaments and biological substances status: Secondary | ICD-10-CM

## 2018-03-28 DIAGNOSIS — D72829 Elevated white blood cell count, unspecified: Secondary | ICD-10-CM | POA: Diagnosis not present

## 2018-03-28 DIAGNOSIS — F339 Major depressive disorder, recurrent, unspecified: Secondary | ICD-10-CM

## 2018-03-28 LAB — CBC
HEMATOCRIT: 42.2 % (ref 36.0–46.0)
Hemoglobin: 13.6 g/dL (ref 12.0–15.0)
MCH: 29.5 pg (ref 26.0–34.0)
MCHC: 32.2 g/dL (ref 30.0–36.0)
MCV: 91.5 fL (ref 78.0–100.0)
Platelets: 303 10*3/uL (ref 150–400)
RBC: 4.61 MIL/uL (ref 3.87–5.11)
RDW: 19.5 % — AB (ref 11.5–15.5)
WBC: 13 10*3/uL — ABNORMAL HIGH (ref 4.0–10.5)

## 2018-03-28 LAB — I-STAT ARTERIAL BLOOD GAS, ED
ACID-BASE DEFICIT: 1 mmol/L (ref 0.0–2.0)
Bicarbonate: 25.7 mmol/L (ref 20.0–28.0)
O2 Saturation: 90 %
PCO2 ART: 50.9 mmHg — AB (ref 32.0–48.0)
TCO2: 27 mmol/L (ref 22–32)
pH, Arterial: 7.312 — ABNORMAL LOW (ref 7.350–7.450)
pO2, Arterial: 64 mmHg — ABNORMAL LOW (ref 83.0–108.0)

## 2018-03-28 LAB — BASIC METABOLIC PANEL
Anion gap: 8 (ref 5–15)
BUN: 9 mg/dL (ref 6–20)
CHLORIDE: 106 mmol/L (ref 101–111)
CO2: 25 mmol/L (ref 22–32)
Calcium: 9.1 mg/dL (ref 8.9–10.3)
Creatinine, Ser: 0.63 mg/dL (ref 0.44–1.00)
GFR calc Af Amer: >60 mL/min (ref >60)
GLUCOSE: 122 mg/dL — AB (ref 65–99)
POTASSIUM: 4.2 mmol/L (ref 3.5–5.1)
Sodium: 139 mmol/L (ref 135–145)

## 2018-03-28 LAB — I-STAT TROPONIN, ED: TROPONIN I, POC: 0.02 ng/mL (ref 0.00–0.08)

## 2018-03-28 LAB — MRSA PCR SCREENING: MRSA by PCR: NEGATIVE

## 2018-03-28 LAB — I-STAT BETA HCG BLOOD, ED (MC, WL, AP ONLY): I-stat hCG, quantitative: <5 m[IU]/mL (ref <5)

## 2018-03-28 MED ORDER — ACETAMINOPHEN 325 MG PO TABS
650.0000 mg | ORAL_TABLET | Freq: Four times a day (QID) | ORAL | Status: DC | PRN
  Administered 2018-03-29: 650 mg via ORAL
  Filled 2018-03-28: qty 2

## 2018-03-28 MED ORDER — ONDANSETRON HCL 4 MG PO TABS: 4.0000 mg | ORAL_TABLET | Freq: Four times a day (QID) | ORAL | Status: DC | PRN

## 2018-03-28 MED ORDER — ENSURE ENLIVE PO LIQD
237.0000 mL | Freq: Two times a day (BID) | ORAL | Status: DC
  Administered 2018-03-29 – 2018-03-30 (×2): 237 mL via ORAL

## 2018-03-28 MED ORDER — ENOXAPARIN SODIUM 40 MG/0.4ML ~~LOC~~ SOLN
40.0000 mg | SUBCUTANEOUS | Status: DC
  Administered 2018-03-28 – 2018-03-31 (×4): 40 mg via SUBCUTANEOUS
  Filled 2018-03-28 (×5): qty 0.4

## 2018-03-28 MED ORDER — IPRATROPIUM-ALBUTEROL 0.5-2.5 (3) MG/3ML IN SOLN
3.0000 mL | Freq: Three times a day (TID) | RESPIRATORY_TRACT | Status: DC
  Administered 2018-03-29 – 2018-03-31 (×8): 3 mL via RESPIRATORY_TRACT
  Filled 2018-03-28 (×9): qty 3

## 2018-03-28 MED ORDER — METHYLPREDNISOLONE SODIUM SUCC 125 MG IJ SOLR
60.0000 mg | Freq: Three times a day (TID) | INTRAMUSCULAR | Status: DC
  Administered 2018-03-28 – 2018-03-30 (×7): 60 mg via INTRAVENOUS
  Filled 2018-03-28 (×7): qty 2

## 2018-03-28 MED ORDER — IPRATROPIUM BROMIDE 0.02 % IN SOLN
1.0000 mg | Freq: Once | RESPIRATORY_TRACT | Status: AC
  Administered 2018-03-28: 1 mg via RESPIRATORY_TRACT
  Filled 2018-03-28: qty 5

## 2018-03-28 MED ORDER — ONDANSETRON HCL 4 MG/2ML IJ SOLN: 4.0000 mg | Freq: Four times a day (QID) | INTRAMUSCULAR | Status: DC | PRN

## 2018-03-28 MED ORDER — METHYLPREDNISOLONE SODIUM SUCC 125 MG IJ SOLR
125.0000 mg | Freq: Once | INTRAMUSCULAR | Status: AC
  Administered 2018-03-28: 125 mg via INTRAVENOUS
  Filled 2018-03-28: qty 2

## 2018-03-28 MED ORDER — IPRATROPIUM-ALBUTEROL 0.5-2.5 (3) MG/3ML IN SOLN
3.0000 mL | Freq: Four times a day (QID) | RESPIRATORY_TRACT | Status: DC
  Administered 2018-03-28 (×2): 3 mL via RESPIRATORY_TRACT
  Filled 2018-03-28 (×2): qty 3

## 2018-03-28 MED ORDER — IPRATROPIUM-ALBUTEROL 0.5-2.5 (3) MG/3ML IN SOLN: 3.0000 mL | RESPIRATORY_TRACT | Status: DC | PRN

## 2018-03-28 MED ORDER — LORATADINE 10 MG PO TABS
10.0000 mg | ORAL_TABLET | Freq: Every day | ORAL | Status: DC
  Administered 2018-03-28 – 2018-03-31 (×4): 10 mg via ORAL
  Filled 2018-03-28 (×4): qty 1

## 2018-03-28 MED ORDER — ALBUTEROL (5 MG/ML) CONTINUOUS INHALATION SOLN
15.0000 mg/h | INHALATION_SOLUTION | Freq: Once | RESPIRATORY_TRACT | Status: AC
  Administered 2018-03-28: 15 mg/h via RESPIRATORY_TRACT
  Filled 2018-03-28: qty 20

## 2018-03-28 MED ORDER — IPRATROPIUM-ALBUTEROL 0.5-2.5 (3) MG/3ML IN SOLN
3.0000 mL | Freq: Four times a day (QID) | RESPIRATORY_TRACT | Status: DC
  Administered 2018-03-28: 3 mL via RESPIRATORY_TRACT
  Filled 2018-03-28: qty 3

## 2018-03-28 MED ORDER — ACETAMINOPHEN 650 MG RE SUPP: 650.0000 mg | Freq: Four times a day (QID) | RECTAL | Status: DC | PRN

## 2018-03-28 MED ORDER — BUDESONIDE 0.5 MG/2ML IN SUSP
0.5000 mg | Freq: Two times a day (BID) | RESPIRATORY_TRACT | Status: DC
  Administered 2018-03-28 – 2018-03-30 (×5): 0.5 mg via RESPIRATORY_TRACT
  Filled 2018-03-28 (×8): qty 2

## 2018-03-28 MED ORDER — IPRATROPIUM-ALBUTEROL 0.5-2.5 (3) MG/3ML IN SOLN
3.0000 mL | RESPIRATORY_TRACT | Status: DC
  Administered 2018-03-28: 3 mL via RESPIRATORY_TRACT
  Filled 2018-03-28: qty 3

## 2018-03-28 NOTE — ED Notes (Signed)
Pt remains off BIPAP on 3L [place]. Seems to be tolerating well.

## 2018-03-28 NOTE — H&P (Signed)
History and Physical    Elizabeth Villarreal ZOX:[id number] DOB: 1971/03/22 DOA: 03/28/2018  Referring MD/NP/PA: Dr. Baxter Hire Ward PCP: System, Pcp Not In  Patient coming from: home via EMS  Chief Complaint: Shortness of breath  I have personally briefly reviewed patient's old medical records in [place] [facility]   HPI: Elizabeth Villarreal is a 47 y.o. female with medical history significant of COPD and tobacco abuse; who presents with complaints of progressively worsening shortness of breath over the past 1-2 days.  At baseline the patient does not require oxygen.  She thinks symptoms may have been brought on by the increase pollen and weather changes.  Complaining of mostly increased chest tightness.  She does not endorse significant cough.  Patient notes that she has not smoked a cigarette in over 1-2 months.  She tried using home albuterol treatments without relief.  Denies any significant chest pain, recent travel, fever, chills, nausea, vomiting, abdominal pain, leg swelling, or recent sick contacts.  When EMS arrived patient was noted to have O2 saturations 76% on room air.  She was placed on nonrebreather  ED Course: Upon admission patient was noted to be afebrile, pulse 94-132, respirations 15-32, blood pressure is 96/60-144/83, O2 saturations as low as 76%.  Patient was placed on BiPAP with improvement of O2 saturations to 100%.  Initial ABG revealed  pH 7.312, PCO2 50.9, PO2 64.  Labs revealed WBC 13.  Chest x-ray otherwise was noted to show emphysematous changes with no focal infiltrate or signs of edema.  Patient was given 125 mg of Solu-Medrol and scheduled breathing treatments every 4 hours.  TRH called to admit.   Review of Systems  Constitutional: Positive for malaise/fatigue. Negative for fever.  HENT: Negative for ear discharge and nosebleeds.   Eyes: Negative for double vision and photophobia.  Respiratory: Positive for shortness of breath and wheezing. Negative for cough.   Cardiovascular:  Negative for chest pain and leg swelling.  Gastrointestinal: Negative for abdominal pain, constipation and vomiting.  Genitourinary: Negative for dysuria and hematuria.  Musculoskeletal: Negative for back pain and neck pain.  Skin: Negative for itching and rash.  Neurological: Negative for loss of consciousness.  Endo/Heme/Allergies: Positive for environmental allergies. Does not bruise/bleed easily.  Psychiatric/Behavioral: Negative for depression. The patient is nervous/anxious.     Past Medical History:  Diagnosis Date  . COPD (chronic obstructive pulmonary disease) (HCC)   . Depression   . Tobacco abuse     Past Surgical History:  Procedure Laterality Date  . CESAREAN SECTION    . ECTOPIC PREGNANCY SURGERY    . ovarian cyst rupture    . ovary cyst rupture       reports that she has quit smoking. Her smoking use included cigarettes. She smoked 1.00 pack per day. She has never used smokeless tobacco. She reports that she does not drink alcohol or use drugs.  Allergies  Allergen Reactions  . Darvon [Propoxyphene] Other (See Comments)    Patient was aware her spoken words were not matching her thoughts  . Percocet [Oxycodone-Acetaminophen] Other (See Comments)    Patient was aware her spoken words were not matching her thoughts    Family History  Problem Relation Age of Onset  . COPD Mother   . Lung cancer Father     Prior to Admission medications   Medication Sig Start Date End Date Taking? Authorizing Provider  acetaminophen (TYLENOL) 325 MG tablet Take 650 mg by mouth every 6 (six) hours as needed for mild  pain.   Yes [provider]  albuterol (PROAIR HFA) 108 (90 Base) MCG/ACT inhaler Inhale 1-2 puffs into the lungs every 6 (six) hours as needed for wheezing or shortness of breath. 02/27/18  Yes Storm FriskWright, Patrick E, MD  albuterol (PROVENTIL) (2.5 MG/3ML) 0.083% nebulizer solution Take 3 mLs (2.5 mg total) by nebulization 4 (four) times daily. With the  ipratropium 02/07/18  Yes Storm FriskWright, Patrick E, MD  ipratropium (ATROVENT) 0.02 % nebulizer solution Take 2.5 mLs (0.5 mg total) by nebulization 4 (four) times daily. 02/07/18  Yes Storm FriskWright, Patrick E, MD  levofloxacin (LEVAQUIN) 500 MG tablet Take 1 tablet (500 mg total) by mouth daily. Patient not taking: Reported on 03/12/2018 03/07/18   Storm FriskWright, Patrick E, MD  predniSONE (DELTASONE) 10 MG tablet Take 4 for three days 3 for three days 2 for three days 1 for three days and stop Patient not taking: Reported on 03/19/2018 03/07/18   Storm FriskWright, Patrick E, MD    Physical Exam:  Constitutional: Middle-aged female NAD, calm, comfortable Vitals:   03/28/18 0357 03/28/18 0358 03/28/18 0400 03/28/18 0445  BP:   100/68 100/67  Pulse:  (!) 108 (!) 105 94  Resp:  17 19 15   Temp:      SpO2: 98% 99% 99% 99%   Eyes: PERRL, lids and conjunctivae normal ENMT: Mucous membranes are moist. Posterior pharynx clear of any exudate or lesions.Normal dentition.  Neck: normal, supple, no masses, no thyromegaly Respiratory: Decreased aeration with normal respiratory effort currently on BiPAP.  Significant wheezing noted. Cardiovascular: Regular rate and rhythm, no murmurs / rubs / gallops. No extremity edema. 2+ pedal pulses. No carotid bruits.  Abdomen: no tenderness, no masses palpated. No hepatosplenomegaly. Bowel sounds positive.  Musculoskeletal: no clubbing / cyanosis. No joint deformity upper and lower extremities. Good ROM, no contractures. Normal muscle tone.  Skin: no rashes, lesions, ulcers. No induration Neurologic: CN 2-12 grossly intact. Sensation intact, DTR normal. Strength 5/5 in all 4.  Psychiatric: Normal judgment and insight. Alert and oriented x 3. Normal mood.     Labs on Admission: I have personally reviewed following labs and imaging studies  CBC: Recent Labs  Lab 03/28/18 0058  WBC 13.0*  HGB 13.6  HCT 42.2  MCV 91.5  PLT 303   Basic Metabolic Panel: Recent Labs  Lab 03/28/18 0058  NA 139   K 4.2  CL 106  CO2 25  GLUCOSE 122*  BUN 9  CREATININE 0.63  CALCIUM 9.1   GFR: CrCl cannot be calculated (Unknown ideal weight.). Liver Function Tests: No results for input(s): AST, ALT, ALKPHOS, BILITOT, PROT, ALBUMIN in the last 168 hours. No results for input(s): LIPASE, AMYLASE in the last 168 hours. No results for input(s): AMMONIA in the last 168 hours. Coagulation Profile: No results for input(s): INR, PROTIME in the last 168 hours. Cardiac Enzymes: No results for input(s): CKTOTAL, CKMB, CKMBINDEX, TROPONINI in the last 168 hours. BNP (last 3 results) No results for input(s): PROBNP in the last 8760 hours. HbA1C: No results for input(s): HGBA1C in the last 72 hours. CBG: No results for input(s): GLUCAP in the last 168 hours. Lipid Profile: No results for input(s): CHOL, HDL, LDLCALC, TRIG, CHOLHDL, LDLDIRECT in the last 72 hours. Thyroid Function Tests: No results for input(s): TSH, T4TOTAL, FREET4, T3FREE, THYROIDAB in the last 72 hours. Anemia Panel: No results for input(s): VITAMINB12, FOLATE, FERRITIN, TIBC, IRON, RETICCTPCT in the last 72 hours. Urine analysis: No results found for: COLORURINE, APPEARANCEUR, LABSPEC, PHURINE, GLUCOSEU,  HGBUR, BILIRUBINUR, KETONESUR, PROTEINUR, UROBILINOGEN, NITRITE, LEUKOCYTESUR Sepsis Labs: No results found for this or any previous visit (from the past 240 hour(s)).   Radiological Exams on Admission: Dg Chest Portable 1 View  Result Date: 03/28/2018 CLINICAL DATA:  Dyspnea and COPD.  Cough x2 days.  Former smoker. EXAM: PORTABLE CHEST 1 VIEW COMPARISON:  02/19/2018 FINDINGS: The lateral aspect of the left lung base is excluded on this portable study. Redemonstration of marked emphysematous hyperinflation of the lungs without acute pulmonary consolidation, effusion or pneumothorax. Heart and mediastinal contours are stable without aneurysm. A rounded density overlying the right fifth rib is believed to be costochondral in  etiology. IMPRESSION: Emphysematous appearance of the lungs consistent with COPD. No acute pulmonary consolidation or CHF. Electronically Signed   By: Tollie Eth M.D.   On: 03/28/2018 01:52    EKG: Independently reviewed.  Sinus tachycardia 114 bpm.  Assessment/Plan Acute respiratory failure with hypercapnia and hypoxia 2/2 COPD exacerbation: Patient presents with progressively worsening shortness of breath for the last few days.  Initial ABG showed signs of respiratory acidosis.  Denies any recent tobacco use. - Admit to stepdown bed - Continuous pulse oximetry with oxygen as needed - BiPAP PRN - Duonebs QID and prn shortness of breath/wheezing - Solu-Medrol IV   Leukocytosis: Acute.  Patient initial WBC elevated at 13.  Question of secondary to acute stress.  No clear signs of infection noted on chest x-ray.  Suspect less likely infectious in nature.  History of tobacco abuse: Patient reports quitting smoking approximately 1-2 months ago. - Continue to encourage cessation of tobacco use.  DVT prophylaxis: Lovenox Code Status: full Family Communication: No family present at bedside Disposition Plan: Likely discharge home once medically stable Consults called: none  Admission status:Inpatient  Clydie Braun MD [facility] Pager [phone]   If 7PM-7AM, please contact night-coverage [email or web address] Password [facility]  03/28/2018, 5:15 AM

## 2018-03-28 NOTE — ED Notes (Signed)
Lunch tray delivered and pt ate well; still tolerates being off Bipap with nasal cannula

## 2018-03-28 NOTE — ED Notes (Addendum)
Meal Tray ordered at 11:10. 

## 2018-03-28 NOTE — ED Triage Notes (Signed)
Pt states that she has been having increased SOB throughout the day unrelieved by home inhalers, hhx of COPD, stats 76% on RA, on non-rebreather. Denies cough or fevers. Accessory muscle use, speaking in short sentences.

## 2018-03-28 NOTE — ED Notes (Signed)
Admitting MD - Dr Nelson ChimesAmin paged to [facility] @ [phone].

## 2018-03-28 NOTE — Progress Notes (Signed)
PROGRESS NOTE    Elizabeth Villarreal  RUE:[id number] DOB: [birth year] DOA: 03/28/2018 PCP: System, Pcp Not In   Brief Narrative:  47 year old female with history of COPD, former smoker quit in February 2019 comes to the hospital with complains of progressively worsening shortness of breath over the course of last 24 hours prior to her admission.  Patient states she has couple of pets at home and then yesterday she went out with her daughter as was her birthday and thinks she was exposed to excessive amount of pollen.  And also due to weather changes she started experiencing some chest tightness along with some shortness of breath.  After going home she started using her nebulizer without any help therefore came to the hospital for evaluation.  Initially she was noted to be very hypoxic in the ER saturating 80 percent therefore had to be placed on BiPAP and was started on routine breathing treatment.   Assessment & Plan:   Principal Problem:   Respiratory failure with hypoxia and hypercapnia (HCC) Active Problems:   COPD with acute exacerbation (HCC)   Leukocytosis   Acute respiratory failure with hypercapnia and hypoxia, less than 80% on room air, improved Acute moderate to severe COPD exacerbation of mild persistent disease -Currently patient is off BiPAP, will attempt to wean off oxygen -Continue IV Solu-Medrol 60 mg every 8 hours -Nebulizer treatments scheduled and as necessary - Pulmicort has been added -Incentive spirometry and flutter valve -Leukocytosis probably secondary to steroid use.  If necessary will start antibiotics  Normocytic anemia -Appears to be stable  14 mm right lower lung pulmonary nodule with central calcification - Likely a granuloma.  Likely does not require follow-up but will defer this outpatient to the primary care physician and pulmonology  History of depression -Followed outpatient at behavioral health  Severe protein calorie malnutrition -Will order  supplements and dietitian consult.   DVT prophylaxis: Lovenox Code Status: Full code  Family Communication: NoneAt bedside Disposition Plan: To be determined  Consultants:   NOne  Procedures:   None  Antimicrobials:   None   Subjective: States her breathing has been better since has been in the ER.  Currently she is off BiPAP on 3 L nasal cannula.    Review of Systems Otherwise negative except as per HPI, including: General: Denies fever, chills, night sweats or unintended weight loss. Resp: Denies cough Cardiac: Denies chest pain, palpitations, orthopnea, paroxysmal nocturnal dyspnea. GI: Denies abdominal pain, nausea, vomiting, diarrhea or constipation GU: Denies dysuria, frequency, hesitancy or incontinence MS: Denies muscle aches, joint pain or swelling Neuro: Denies headache, neurologic deficits (focal weakness, numbness, tingling), abnormal gait Psych: Denies anxiety, depression, SI/HI/AVH Skin: Denies new rashes or lesions ID: Denies sick contacts, exotic exposures, travel  Objective: Vitals:   03/28/18 0815 03/28/18 0830 03/28/18 0845 03/28/18 0900  BP: 106/73 103/68 116/87 110/73  Pulse: (!) 110 (!) 106 (!) 109 98  Resp: (!) 26 (!) 27 (!) 22 (!) 24  Temp:      SpO2: 98% 98% 97% 95%  Weight:      Height:       No intake or output data in the 24 hours ending 03/28/18 1128 Filed Weights   03/28/18 0530  Weight: 49.2 kg (108 lb 7.5 oz)    Examination:  General exam: Appears calm and comfortable, cachectic frail appearing with temporal wasting bilateral Respiratory system: Diffuse diminished breath sounds Cardiovascular system: S1 & S2 heard, RRR. No JVD, murmurs, rubs, gallops or clicks. No pedal  edema. Gastrointestinal system: Abdomen is nondistended, soft and nontender. No organomegaly or masses felt. Normal bowel sounds heard. Central nervous system: Alert and oriented. No focal neurological deficits. Extremities: Symmetric 5 x 5 power. Skin: No  rashes, lesions or ulcers Psychiatry: Judgement and insight appear normal. Mood & affect appropriate.     Data Reviewed:   CBC: Recent Labs  Lab 03/28/18 0058  WBC 13.0*  HGB 13.6  HCT 42.2  MCV 91.5  PLT 303   Basic Metabolic Panel: Recent Labs  Lab 03/28/18 0058  NA 139  K 4.2  CL 106  CO2 25  GLUCOSE 122*  BUN 9  CREATININE 0.63  CALCIUM 9.1   GFR: Estimated Creatinine Clearance: 67.5 mL/min (by C-G formula based on SCr of 0.63 mg/dL). Liver Function Tests: No results for input(s): AST, ALT, ALKPHOS, BILITOT, PROT, ALBUMIN in the last 168 hours. No results for input(s): LIPASE, AMYLASE in the last 168 hours. No results for input(s): AMMONIA in the last 168 hours. Coagulation Profile: No results for input(s): INR, PROTIME in the last 168 hours. Cardiac Enzymes: No results for input(s): CKTOTAL, CKMB, CKMBINDEX, TROPONINI in the last 168 hours. BNP (last 3 results) No results for input(s): PROBNP in the last 8760 hours. HbA1C: No results for input(s): HGBA1C in the last 72 hours. CBG: No results for input(s): GLUCAP in the last 168 hours. Lipid Profile: No results for input(s): CHOL, HDL, LDLCALC, TRIG, CHOLHDL, LDLDIRECT in the last 72 hours. Thyroid Function Tests: No results for input(s): TSH, T4TOTAL, FREET4, T3FREE, THYROIDAB in the last 72 hours. Anemia Panel: No results for input(s): VITAMINB12, FOLATE, FERRITIN, TIBC, IRON, RETICCTPCT in the last 72 hours. Sepsis Labs: No results for input(s): PROCALCITON, LATICACIDVEN in the last 168 hours.  No results found for this or any previous visit (from the past 240 hour(s)).       Radiology Studies: Dg Chest Portable 1 View  Result Date: 03/28/2018 CLINICAL DATA:  Dyspnea and COPD.  Cough x2 days.  Former smoker. EXAM: PORTABLE CHEST 1 VIEW COMPARISON:  02/19/2018 FINDINGS: The lateral aspect of the left lung base is excluded on this portable study. Redemonstration of marked emphysematous  hyperinflation of the lungs without acute pulmonary consolidation, effusion or pneumothorax. Heart and mediastinal contours are stable without aneurysm. A rounded density overlying the right fifth rib is believed to be costochondral in etiology. IMPRESSION: Emphysematous appearance of the lungs consistent with COPD. No acute pulmonary consolidation or CHF. Electronically Signed   By: Tollie Ethavid  Kwon M.D.   On: 03/28/2018 01:52        Scheduled Meds: . budesonide (PULMICORT) nebulizer solution  0.5 mg Nebulization BID  . enoxaparin (LOVENOX) injection  40 mg Subcutaneous Q24H  . ipratropium-albuterol  3 mL Nebulization Q6H  . loratadine  10 mg Oral Daily  . methylPREDNISolone (SOLU-MEDROL) injection  60 mg Intravenous Q8H   Continuous Infusions:   LOS: 0 days    Time spent: 32mins    Ankit Joline Maxcyhirag Amin, MD [facility] Pager [phone]   If 7PM-7AM, please contact night-coverage [email or web address] Password [email or web address] 03/28/2018, 11:28 AM

## 2018-03-28 NOTE — ED Notes (Addendum)
Pt taken off BiPAP and ambulated to bathroom and placed on 3L

## 2018-03-28 NOTE — ED Provider Notes (Signed)
TIME SEEN: 1:20 AM  CHIEF COMPLAINT: Shortness of breath  HPI: Patient is a 47 year old female with history of previous tobacco use with COPD not on home oxygen who presents to the emergency department with worsening shortness of breath over the past day.  She has had nonproductive cough.  No fever.  No chest pain.  No history of CHF.  No lower extremity swelling or pain.  Using nebulizer treatments at home without any relief.  Sats in the 70s at home.  Came in by private vehicle.  ROS: See HPI Constitutional: no fever  Eyes: no drainage  ENT: no runny nose   Cardiovascular:  no chest pain  Resp:  SOB  GI: no vomiting GU: no dysuria Integumentary: no rash  Allergy: no hives  Musculoskeletal: no leg swelling  Neurological: no slurred speech ROS otherwise negative  PAST MEDICAL HISTORY/PAST SURGICAL HISTORY:  Past Medical History:  Diagnosis Date  . COPD (chronic obstructive pulmonary disease) (HCC)   . Depression   . Tobacco abuse     MEDICATIONS:  Prior to Admission medications   Medication Sig Start Date End Date Taking? Authorizing Provider  acetaminophen (TYLENOL) 325 MG tablet Take 650 mg by mouth every 6 (six) hours as needed for mild pain.    [provider]  albuterol (PROAIR HFA) 108 (90 Base) MCG/ACT inhaler Inhale 1-2 puffs into the lungs every 6 (six) hours as needed for wheezing or shortness of breath. 02/27/18   Storm Frisk, MD  albuterol (PROVENTIL) (2.5 MG/3ML) 0.083% nebulizer solution Take 3 mLs (2.5 mg total) by nebulization 4 (four) times daily. With the ipratropium 02/07/18   Storm Frisk, MD  ipratropium (ATROVENT) 0.02 % nebulizer solution Take 2.5 mLs (0.5 mg total) by nebulization 4 (four) times daily. 02/07/18   Storm Frisk, MD  levofloxacin (LEVAQUIN) 500 MG tablet Take 1 tablet (500 mg total) by mouth daily. Patient not taking: Reported on 03/12/2018 03/07/18   Storm Frisk, MD  predniSONE (DELTASONE) 10 MG tablet Take 4 for three  days 3 for three days 2 for three days 1 for three days and stop Patient not taking: Reported on 03/19/2018 03/07/18   Storm Frisk, MD    ALLERGIES:  Allergies  Allergen Reactions  . Darvon [Propoxyphene] Other (See Comments)    Patient was aware her spoken words were not matching her thoughts  . Percocet [Oxycodone-Acetaminophen] Other (See Comments)    Patient was aware her spoken words were not matching her thoughts    SOCIAL HISTORY:  Social History   Tobacco Use  . Smoking status: Former Smoker    Packs/day: 1.00    Types: Cigarettes  . Smokeless tobacco: Never Used  . Tobacco comment: last cigarette 12 days ago  Substance Use Topics  . Alcohol use: No    FAMILY HISTORY: Family History  Problem Relation Age of Onset  . COPD Mother   . Lung cancer Father     EXAM: BP (!) 109/94   Pulse (!) 111   Temp 98.1 F (36.7 C)   Resp (!) 22   SpO2 100%  CONSTITUTIONAL: Alert and oriented and responds appropriately to questions.  Chronically ill-appearing, in moderate respiratory distress HEAD: Normocephalic EYES: Conjunctivae clear, pupils appear equal, EOMI ENT: normal nose; moist mucous membranes NECK: Supple, no meningismus, no nuchal rigidity, no LAD  CARD: Regular and tachycardic; S1 and S2 appreciated; no murmurs, no clicks, no rubs, no gallops RESP: Patient is tachypneic, speaking short sentences, in  mild to moderate respiratory distress, hypoxic on room air, significantly diminished aeration diffusely with scattered respiratory wheezes, no rhonchi or rales ABD/GI: Normal bowel sounds; non-distended; soft, non-tender, no rebound, no guarding, no peritoneal signs, no hepatosplenomegaly BACK:  The back appears normal and is non-tender to palpation, there is no CVA tenderness EXT: Normal ROM in all joints; non-tender to palpation; no edema; normal capillary refill; no cyanosis, no calf tenderness or swelling    SKIN: Normal color for age and race; warm; no  rash NEURO: Moves all extremities equally PSYCH: The patient's mood and manner are appropriate. Grooming and personal hygiene are appropriate.  MEDICAL DECISION MAKING: Patient here with likely COPD exacerbation.  This does not look like volume overload.  PE is on the differential but less likely given diminished aeration and wheezing.  Will obtain labs, chest x-ray.  Doubt ACS or dissection.  I feel she will need admission.  Will give continuous breathing treatments, IV Solu-Medrol.  Will check ABG.  ED PROGRESS: ABG shows very mild respiratory acidosis.  Given this and her increased work of breathing we will start BiPAP.  She is improving slowly with continuous treatments.  Otherwise labs unremarkable.  Chest x-ray stable and shows COPD changes but no other acute abnormality including edema or infiltrate.  Will discuss with medicine for admission to stepdown.  2:50 AM Discussed patient's case with hospitalist, Dr. Katrinka BlazingSmith.  I have recommended admission and patient (and family if present) agree with this plan. Admitting physician will place admission orders.   I reviewed all nursing notes, vitals, pertinent previous records, EKGs, lab and urine results, imaging (as available).     EKG Interpretation  Date/Time:  Wednesday March 28 2018 00:46:44 EDT Ventricular Rate:  111 PR Interval:  124 QRS Duration: 94 QT Interval:  310 QTC Calculation: 421 R Axis:   146 Text Interpretation:  Sinus tachycardia Right atrial enlargement Right axis deviation Possible Anterior infarct , age undetermined Abnormal ECG No significant change since last tracing Confirmed by Ward, Baxter HireKristen [phone]([id number]) on 03/28/2018 1:09:24 AM        CRITICAL CARE Performed by: Baxter HireKristen Ward   Total critical care time: 45 minutes  Critical care time was exclusive of separately billable procedures and treating other patients.  Critical care was necessary to treat or prevent imminent or life-threatening deterioration.  Critical  care was time spent personally by me on the following activities: development of treatment plan with patient and/or surrogate as well as nursing, discussions with consultants, evaluation of patient's response to treatment, examination of patient, obtaining history from patient or surrogate, ordering and performing treatments and interventions, ordering and review of laboratory studies, ordering and review of radiographic studies, pulse oximetry and re-evaluation of patient's condition.    Ward, Layla MawKristen N, DO 03/28/18 [phone]

## 2018-03-28 NOTE — ED Notes (Signed)
Pharm called to send pulmicort asap

## 2018-03-28 NOTE — ED Notes (Signed)
Pt continues to tolerate being off bipap. NO signs of distress; ate sandwich

## 2018-03-28 NOTE — ED Notes (Signed)
Report given to Rebecca, RN on floor.

## 2018-03-29 DIAGNOSIS — E440 Moderate protein-calorie malnutrition: Secondary | ICD-10-CM

## 2018-03-29 LAB — BASIC METABOLIC PANEL
Anion gap: 8 (ref 5–15)
BUN: 7 mg/dL (ref 6–20)
CALCIUM: 9.3 mg/dL (ref 8.9–10.3)
CO2: 23 mmol/L (ref 22–32)
CREATININE: 0.55 mg/dL (ref 0.44–1.00)
Chloride: 108 mmol/L (ref 101–111)
GFR calc non Af Amer: >60 mL/min (ref >60)
Glucose, Bld: 139 mg/dL — ABNORMAL HIGH (ref 65–99)
Potassium: 4.2 mmol/L (ref 3.5–5.1)
SODIUM: 139 mmol/L (ref 135–145)

## 2018-03-29 LAB — CBC
HCT: 37.5 % (ref 36.0–46.0)
HEMOGLOBIN: 12.1 g/dL (ref 12.0–15.0)
MCH: 29.4 pg (ref 26.0–34.0)
MCHC: 32.3 g/dL (ref 30.0–36.0)
MCV: 91.2 fL (ref 78.0–100.0)
PLATELETS: 282 10*3/uL (ref 150–400)
RBC: 4.11 MIL/uL (ref 3.87–5.11)
RDW: 19.8 % — AB (ref 11.5–15.5)
WBC: 15.6 10*3/uL — ABNORMAL HIGH (ref 4.0–10.5)

## 2018-03-29 LAB — MAGNESIUM: MAGNESIUM: 2 mg/dL (ref 1.7–2.4)

## 2018-03-29 NOTE — Progress Notes (Signed)
PROGRESS NOTE    Elizabeth Villarreal  ZOX:[id number] DOB: 1971/04/04 DOA: 03/28/2018 PCP: Marcine Matar, MD   Brief Narrative:  47 year old female with history of COPD, former smoker quit in February 2019 comes to the hospital with complains of progressively worsening shortness of breath over the course of last 24 hours prior to her admission.  Patient states she has couple of pets at home and then yesterday she went out with her daughter as was her birthday and thinks she was exposed to excessive amount of pollen.  And also due to weather changes she started experiencing some chest tightness along with some shortness of breath.  After going home she started using her nebulizer without any help therefore came to the hospital for evaluation.  Initially she was noted to be very hypoxic in the ER saturating 80 percent therefore had to be placed on BiPAP and was started on routine breathing treatment.  Currently she is doing well on Solu-Medrol, Pulmicort and routine breathing treatments.   Assessment & Plan:   Principal Problem:   Respiratory failure with hypoxia and hypercapnia (HCC) Active Problems:   COPD with acute exacerbation (HCC)   Leukocytosis   Acute respiratory failure with hypercapnia and hypoxia, less than 80% on room air, slowly improved Acute moderate to severe COPD exacerbation of mild persistent disease -Currently she is off BiPAP.  On 3 L nasal cannula. -Continue IV Solu-Medrol 60 mg every 8 hours.  Due to that she has  mild leukocytosis -Nebulizer treatments scheduled and as necessary - Continue Pulmicort.  May require some sort of steroid inhaler at the time of discharge.  She is also been advised that prior to exposing herself to any sort of trigger after discharge she should use her rescue inhaler preemptively. -Incentive spirometry and flutter valve -No need for antibiotics at this time. -Ambulatory pulse ox before she goes home.  Normocytic anemia -Appears to be  stable.  Hemoglobin is 12.1  14 mm right lower lung pulmonary nodule with central calcification - Likely a granuloma.  Likely does not require follow-up but will defer this outpatient to the primary care physician and pulmonology  History of depression -Followed outpatient at behavioral health  Severe protein calorie malnutrition -Dietitian consulted  DVT prophylaxis: Lovenox Code Status: Full code  Family Communication: NoneAt bedside Disposition Plan: Maintain inpatient stay until her breathing improves.  She still requires IV Solu-Medrol.  Consultants:   None    Procedures:   None  Antimicrobials:   None   Subjective: States she still has some exertional shortness of breath with minimal exertion.  Overall feels slightly better than yesterday.  Review of Systems Otherwise negative except as per HPI, including: HEENT/EYES = negative for pain, redness, loss of vision, double vision, blurred vision, loss of hearing, sore throat, hoarseness, dysphagia Cardiovascular= negative for chest pain, palpitation, murmurs, lower extremity swelling Respiratory/lungs= negative for shortness of breath, cough, hemoptysis, wheezing, mucus production Gastrointestinal= negative for nausea, vomiting,, abdominal pain, melena, hematemesis Genitourinary= negative for Dysuria, Hematuria, Change in Urinary Frequency MSK = Negative for arthralgia, myalgias, Back Pain, Joint swelling  Neurology= Negative for headache, seizures, numbness, tingling  Psychiatry= Negative for anxiety, depression, suicidal and homocidal ideation Allergy/Immunology= Medication/Food allergy as listed  Skin= Negative for Rash, lesions, ulcers, itching   Objective: Vitals:   03/29/18 0400 03/29/18 0853 03/29/18 0921 03/29/18 0922  BP:  107/66    Pulse: 80 99    Resp: (!) 26 20    Temp:  98.4 F (36.9  C)    TempSrc:  Oral    SpO2: 98% 98% 95% 96%  Weight:      Height:        Intake/Output Summary (Last 24  hours) at 03/29/2018 1056 Last data filed at 03/29/2018 0400 Gross per 24 hour  Intake 960 ml  Output 600 ml  Net 360 ml   Filed Weights   03/28/18 0530 03/28/18 1558  Weight: 49.2 kg (108 lb 7.5 oz) 50.2 kg (110 lb 10.7 oz)    Examination:  Constitutional: NAD, calm, comfortable, frail appearing with bilateral temporal wasting, on 3 L nasal cannula Eyes: PERRL, lids and conjunctivae normal ENMT: Mucous membranes are moist. Posterior pharynx clear of any exudate or lesions.Normal dentition.  Neck: normal, supple, no masses, no thyromegaly Respiratory: Diffuse diminished breath sounds with very mild expiratory wheezing Cardiovascular: Regular rate and rhythm, no murmurs / rubs / gallops. No extremity edema. 2+ pedal pulses. No carotid bruits.  Abdomen: no tenderness, no masses palpated. No hepatosplenomegaly. Bowel sounds positive.  Musculoskeletal: no clubbing / cyanosis. No joint deformity upper and lower extremities. Good ROM, no contractures. Normal muscle tone.  Skin: no rashes, lesions, ulcers. No induration Neurologic: CN 2-12 grossly intact. Sensation intact, DTR normal. Strength 5/5 in all 4.  Psychiatric: Normal judgment and insight. Alert and oriented x 3. Normal mood.      Data Reviewed:   CBC: Recent Labs  Lab 03/28/18 0058 03/29/18 0510  WBC 13.0* 15.6*  HGB 13.6 12.1  HCT 42.2 37.5  MCV 91.5 91.2  PLT 303 282   Basic Metabolic Panel: Recent Labs  Lab 03/28/18 0058 03/29/18 0510  NA 139 139  K 4.2 4.2  CL 106 108  CO2 25 23  GLUCOSE 122* 139*  BUN 9 7  CREATININE 0.63 0.55  CALCIUM 9.1 9.3  MG  --  2.0   GFR: Estimated Creatinine Clearance: 68.9 mL/min (by C-G formula based on SCr of 0.55 mg/dL). Liver Function Tests: No results for input(s): AST, ALT, ALKPHOS, BILITOT, PROT, ALBUMIN in the last 168 hours. No results for input(s): LIPASE, AMYLASE in the last 168 hours. No results for input(s): AMMONIA in the last 168 hours. Coagulation  Profile: No results for input(s): INR, PROTIME in the last 168 hours. Cardiac Enzymes: No results for input(s): CKTOTAL, CKMB, CKMBINDEX, TROPONINI in the last 168 hours. BNP (last 3 results) No results for input(s): PROBNP in the last 8760 hours. HbA1C: No results for input(s): HGBA1C in the last 72 hours. CBG: No results for input(s): GLUCAP in the last 168 hours. Lipid Profile: No results for input(s): CHOL, HDL, LDLCALC, TRIG, CHOLHDL, LDLDIRECT in the last 72 hours. Thyroid Function Tests: No results for input(s): TSH, T4TOTAL, FREET4, T3FREE, THYROIDAB in the last 72 hours. Anemia Panel: No results for input(s): VITAMINB12, FOLATE, FERRITIN, TIBC, IRON, RETICCTPCT in the last 72 hours. Sepsis Labs: No results for input(s): PROCALCITON, LATICACIDVEN in the last 168 hours.  Recent Results (from the past 240 hour(s))  MRSA PCR Screening     Status: None   Collection Time: 03/28/18  4:06 PM  Result Value Ref Range Status   MRSA by PCR NEGATIVE NEGATIVE Final    Comment:        The GeneXpert MRSA Assay (FDA approved for NASAL specimens only), is one component of a comprehensive MRSA colonization surveillance program. It is not intended to diagnose MRSA infection nor to guide or monitor treatment for MRSA infections. Performed at [facility], [address] [place], [place] [address]          Radiology Studies: Dg Chest Portable 1 View  Result Date: 03/28/2018 CLINICAL DATA:  Dyspnea and COPD.  Cough x2 days.  Former smoker. EXAM: PORTABLE CHEST 1 VIEW COMPARISON:  02/19/2018 FINDINGS: The lateral aspect of the left lung base is excluded on this portable study. Redemonstration of marked emphysematous hyperinflation of the lungs without acute pulmonary consolidation, effusion or pneumothorax. Heart and mediastinal contours are stable without aneurysm. A rounded density overlying the right fifth rib is believed to be costochondral in etiology. IMPRESSION:  Emphysematous appearance of the lungs consistent with COPD. No acute pulmonary consolidation or CHF. Electronically Signed   By: Tollie Eth M.D.   On: 03/28/2018 01:52        Scheduled Meds: . budesonide (PULMICORT) nebulizer solution  0.5 mg Nebulization BID  . enoxaparin (LOVENOX) injection  40 mg Subcutaneous Q24H  . feeding supplement (ENSURE ENLIVE)  237 mL Oral BID BM  . ipratropium-albuterol  3 mL Nebulization TID  . loratadine  10 mg Oral Daily  . methylPREDNISolone (SOLU-MEDROL) injection  60 mg Intravenous Q8H   Continuous Infusions:   LOS: 1 day    Time spent:    Akiah Bauch Joline Maxcy, MD [facility] Pager [phone]   If 7PM-7AM, please contact night-coverage [email or web address] Password [facility] 03/29/2018, 10:56 AM

## 2018-03-29 NOTE — Plan of Care (Signed)
  Problem: Education: Goal: Knowledge of General Education information will improve Outcome: Progressing   Problem: Health Behavior/Discharge Planning: Goal: Ability to manage health-related needs will improve Outcome: Progressing   Problem: Elimination: Goal: Will not experience complications related to bowel motility Outcome: Progressing   Problem: Pain Managment: Goal: General experience of comfort will improve Outcome: Progressing   Problem: Safety: Goal: Ability to remain free from injury will improve Outcome: Progressing

## 2018-03-30 ENCOUNTER — Ambulatory Visit: Payer: Medicaid Other | Admitting: Rehabilitation

## 2018-03-30 ENCOUNTER — Encounter: Payer: Self-pay | Admitting: Critical Care Medicine

## 2018-03-30 LAB — BASIC METABOLIC PANEL
ANION GAP: 8 (ref 5–15)
BUN: 10 mg/dL (ref 6–20)
CHLORIDE: 106 mmol/L (ref 101–111)
CO2: 25 mmol/L (ref 22–32)
Calcium: 9.1 mg/dL (ref 8.9–10.3)
Creatinine, Ser: 0.65 mg/dL (ref 0.44–1.00)
GFR calc Af Amer: >60 mL/min (ref >60)
GFR calc non Af Amer: >60 mL/min (ref >60)
GLUCOSE: 140 mg/dL — AB (ref 65–99)
POTASSIUM: 4.8 mmol/L (ref 3.5–5.1)
SODIUM: 139 mmol/L (ref 135–145)

## 2018-03-30 LAB — CBC
HCT: 38.2 % (ref 36.0–46.0)
HEMOGLOBIN: 12 g/dL (ref 12.0–15.0)
MCH: 29.1 pg (ref 26.0–34.0)
MCHC: 31.4 g/dL (ref 30.0–36.0)
MCV: 92.7 fL (ref 78.0–100.0)
Platelets: 320 10*3/uL (ref 150–400)
RBC: 4.12 MIL/uL (ref 3.87–5.11)
RDW: 20 % — ABNORMAL HIGH (ref 11.5–15.5)
WBC: 15 10*3/uL — AB (ref 4.0–10.5)

## 2018-03-30 LAB — MAGNESIUM: MAGNESIUM: 2 mg/dL (ref 1.7–2.4)

## 2018-03-30 MED ORDER — ENSURE ENLIVE PO LIQD
237.0000 mL | Freq: Three times a day (TID) | ORAL | Status: DC
Start: 2018-03-30 — End: 2018-03-31
  Administered 2018-03-30 – 2018-03-31 (×2): 237 mL via ORAL

## 2018-03-30 MED ORDER — AZITHROMYCIN 250 MG PO TABS
500.0000 mg | ORAL_TABLET | Freq: Every day | ORAL | Status: DC
  Administered 2018-03-30 – 2018-03-31 (×2): 500 mg via ORAL
  Filled 2018-03-30 (×2): qty 2

## 2018-03-30 MED ORDER — METHYLPREDNISOLONE SODIUM SUCC 125 MG IJ SOLR
60.0000 mg | Freq: Two times a day (BID) | INTRAMUSCULAR | Status: DC
  Administered 2018-03-30 – 2018-03-31 (×3): 60 mg via INTRAVENOUS
  Filled 2018-03-30 (×3): qty 2

## 2018-03-30 MED ORDER — MOMETASONE FURO-FORMOTEROL FUM 100-5 MCG/ACT IN AERO
2.0000 | INHALATION_SPRAY | Freq: Two times a day (BID) | RESPIRATORY_TRACT | Status: DC
  Administered 2018-03-30 – 2018-03-31 (×3): 2 via RESPIRATORY_TRACT
  Filled 2018-03-30: qty 8.8

## 2018-03-30 NOTE — Progress Notes (Signed)
SATURATION QUALIFICATIONS: (This note is used to comply with regulatory documentation for home oxygen)  Patient Saturations on Room Air at Rest = 91%  Patient Saturations on Room Air while Ambulating = 90%  Patient Saturations on 3 Liters of oxygen while Ambulating = 96%  Please briefly explain why patient needs home oxygen:

## 2018-03-30 NOTE — Progress Notes (Signed)
PROGRESS NOTE    Elizabeth Villarreal  [id number] DOB: October 19, 1971 DOA: 03/28/2018 PCP: Marcine MatarJohnson, Deborah B, MD     Brief Narrative:  Elizabeth AsaMary Marszalek is a 47 year old female with history of COPD, former smoker quit in February 2019 comes to the hospital with complaints of progressively worsening shortness of breath over the course of last 24 hours prior to her admission.  Patient states she has couple of pets at home and then yesterday she went out with her daughter as it was her birthday and thinks she was exposed to excessive amount of pollen.  And also due to weather changes she started experiencing some chest tightness along with some shortness of breath.  After going home, she started using her nebulizer without any help therefore came to the hospital for evaluation.  Initially she was noted to be very hypoxic in the ER saturating 80 percent therefore had to be placed on BiPAP and was started on routine breathing treatment.   She was admitted for further treatment and evaluation of COPD exacerbation.  Assessment & Plan:   Principal Problem:   Respiratory failure with hypoxia and hypercapnia (HCC) Active Problems:   COPD with acute exacerbation (HCC)   Leukocytosis  Acute respiratory failure with hypercapnia and hypoxia secondary to COPD exacerbation -Currently she is off BiPAP.  On 3 L nasal cannula, wean as able.  Home O2 desaturation screen ordered -Continue IV Solu-Medrol, wean today -Continue nebulizer treatments -Start Dulera -Azithromycin 5 days   Reactive leukocytosis -On steroids, monitor CBC   14 mm right lower lung pulmonary nodule with central calcification -Likely a granuloma.  Follow-up outpatient  History of depression -Follow up outpatient at behavioral health  Severe protein calorie malnutrition -Dietitian consulted   DVT prophylaxis: Lovenox Code Status: Full Family Communication: No family bedside Disposition Plan: Pending improvement in clinical status,  continue IV steroids another day and wean   Consultants:   None  Procedures:   None  Antimicrobials:  Anti-infectives (From admission, onward)   Start     Dose/Rate Route Frequency Ordered Stop   03/30/18 1315  azithromycin (ZITHROMAX) tablet 500 mg     500 mg Oral Daily 03/30/18 1303         Subjective: Patient without any new complaints today.  Breathing is mildly improved.  Denies any chest pain.  Objective: Vitals:   03/30/18 0355 03/30/18 0745 03/30/18 0858 03/30/18 1223  BP: 120/81 110/62  114/72  Pulse: 91 84  89  Resp: 14 19  (!) 21  Temp: 97.8 F (36.6 C) 98.3 F (36.8 C)  98.5 F (36.9 C)  TempSrc: Oral Oral  Oral  SpO2: 96% 97% 99% 94%  Weight:      Height:        Intake/Output Summary (Last 24 hours) at 03/30/2018 1305 Last data filed at 03/30/2018 1222 Gross per 24 hour  Intake 720 ml  Output -  Net 720 ml   Filed Weights   03/28/18 0530 03/28/18 1558  Weight: 49.2 kg (108 lb 7.5 oz) 50.2 kg (110 lb 10.7 oz)    Examination:  General exam: Appears calm and comfortable  Respiratory system: Diminished breath sounds bilaterally. Respiratory effort normal. Cardiovascular system: S1 & S2 heard, RRR. No JVD, murmurs, rubs, gallops or clicks. No pedal edema. Gastrointestinal system: Abdomen is nondistended, soft and nontender. No organomegaly or masses felt. Normal bowel sounds heard. Central nervous system: Alert and oriented. No focal neurological deficits. Extremities: Symmetric 5 x 5 power. Skin: No rashes, lesions  or ulcers Psychiatry: Judgement and insight appear normal. Mood & affect appropriate.   Data Reviewed: I have personally reviewed following labs and imaging studies  CBC: Recent Labs  Lab 03/28/18 0058 03/29/18 0510 03/30/18 0244  WBC 13.0* 15.6* 15.0*  HGB 13.6 12.1 12.0  HCT 42.2 37.5 38.2  MCV 91.5 91.2 92.7  PLT 303 282 320   Basic Metabolic Panel: Recent Labs  Lab 03/28/18 0058 03/29/18 0510 03/30/18 0244  NA 139  139 139  K 4.2 4.2 4.8  CL 106 108 106  CO2 25 23 25   GLUCOSE 122* 139* 140*  BUN 9 7 10   CREATININE 0.63 0.55 0.65  CALCIUM 9.1 9.3 9.1  MG  --  2.0 2.0   GFR: Estimated Creatinine Clearance: 68.9 mL/min (by C-G formula based on SCr of 0.65 mg/dL). Liver Function Tests: No results for input(s): AST, ALT, ALKPHOS, BILITOT, PROT, ALBUMIN in the last 168 hours. No results for input(s): LIPASE, AMYLASE in the last 168 hours. No results for input(s): AMMONIA in the last 168 hours. Coagulation Profile: No results for input(s): INR, PROTIME in the last 168 hours. Cardiac Enzymes: No results for input(s): CKTOTAL, CKMB, CKMBINDEX, TROPONINI in the last 168 hours. BNP (last 3 results) No results for input(s): PROBNP in the last 8760 hours. HbA1C: No results for input(s): HGBA1C in the last 72 hours. CBG: No results for input(s): GLUCAP in the last 168 hours. Lipid Profile: No results for input(s): CHOL, HDL, LDLCALC, TRIG, CHOLHDL, LDLDIRECT in the last 72 hours. Thyroid Function Tests: No results for input(s): TSH, T4TOTAL, FREET4, T3FREE, THYROIDAB in the last 72 hours. Anemia Panel: No results for input(s): VITAMINB12, FOLATE, FERRITIN, TIBC, IRON, RETICCTPCT in the last 72 hours. Sepsis Labs: No results for input(s): PROCALCITON, LATICACIDVEN in the last 168 hours.  Recent Results (from the past 240 hour(s))  MRSA PCR Screening     Status: None   Collection Time: 03/28/18  4:06 PM  Result Value Ref Range Status   MRSA by PCR NEGATIVE NEGATIVE Final    Comment:        The GeneXpert MRSA Assay (FDA approved for NASAL specimens only), is one component of a comprehensive MRSA colonization surveillance program. It is not intended to diagnose MRSA infection nor to guide or monitor treatment for MRSA infections. Performed at [facility], [address] [place], [place] [address]        Radiology Studies: No results found.    Scheduled Meds: . azithromycin   500 mg Oral Daily  . enoxaparin (LOVENOX) injection  40 mg Subcutaneous Q24H  . feeding supplement (ENSURE ENLIVE)  237 mL Oral BID BM  . ipratropium-albuterol  3 mL Nebulization TID  . loratadine  10 mg Oral Daily  . methylPREDNISolone (SOLU-MEDROL) injection  60 mg Intravenous Q12H  . mometasone-formoterol  2 puff Inhalation BID   Continuous Infusions:   LOS: 2 days    Time spent: 35 minutes   Noralee Stain, DO [facility] [email or web address] Password [email or web address] 03/30/2018, 1:05 PM
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
## 2018-03-30 NOTE — Progress Notes (Signed)
Initial Nutrition Assessment  DOCUMENTATION CODES:   Severe malnutrition in context of chronic illness, Underweight  INTERVENTION:    Ensure Enlive po TID, each supplement provides 350 kcal and 20 grams of protein  NUTRITION DIAGNOSIS:   Severe Malnutrition related to chronic illness(COPD) as evidenced by energy intake < or equal to 75% for > or equal to 1 month, severe fat depletion, severe muscle depletion  GOAL:   Patient will meet greater than or equal to 90% of their needs  MONITOR:   PO intake, Supplement acceptance, Labs, Skin, Weight trends, I & O's  REASON FOR ASSESSMENT:   Malnutrition Screening Tool  ASSESSMENT:   47 yo Female with history of COPD, former smoker quit in February 2019 comes to the hospital with complaints of progressively worsening shortness of breath over the course of last 24 hours prior to her admission.  Pt known to this RD from previous hospitalization.  States appetite is good. PO intake 100% per flowsheet records. Typically consumes 1 meal per day and grazes during the rest of the day.  Dinner: meat, starch, veggie Snacks: leftovers, peanuts, dried cranberries, toast with cream cheese  Has been struggling to maintain and/or gain weight for 3-4 years. Likes Ensure Enlive (vanilla) nutrition supplements. Labs and medications reviewed. WBC 15 (H).  NUTRITION - FOCUSED PHYSICAL EXAM:    Most Recent Value  Orbital Region  Mild depletion  Upper Arm Region  Moderate depletion  Thoracic and Lumbar Region  Severe depletion  Buccal Region  Mild depletion  Temple Region  Moderate depletion  Clavicle Bone Region  Moderate depletion  Clavicle and Acromion Bone Region  Severe depletion  Scapular Bone Region  Severe depletion  Dorsal Hand  Moderate depletion  Patellar Region  Moderate depletion  Anterior Thigh Region  Severe depletion  Posterior Calf Region  Moderate depletion  Edema (RD Assessment)  None    Diet Order:  Diet regular Room  service appropriate? Yes; Fluid consistency: Thin  EDUCATION NEEDS:   No education needs have been identified at this time  Skin:  Skin Assessment: Reviewed RN Assessment  Last BM:  4/26  Height:   Ht Readings from Last 1 Encounters:  03/28/18 5\' 9"  (1.753 m)   Weight:   Wt Readings from Last 1 Encounters:  03/28/18 110 lb 10.7 oz (50.2 kg)   BMI:  Body mass index is 16.34 kg/m.  Estimated Nutritional Needs:   Kcal:  1700-1900  Protein:  80-95 gm  Fluid:  1.7-1.9 L  Maureen ChattersKatie Alianna Wurster, RD, LDN Pager #: [phone] After-Hours Pager #: [phone]

## 2018-03-31 LAB — BASIC METABOLIC PANEL
Anion gap: 7 (ref 5–15)
BUN: 14 mg/dL (ref 6–20)
CHLORIDE: 102 mmol/L (ref 101–111)
CO2: 30 mmol/L (ref 22–32)
CREATININE: 0.55 mg/dL (ref 0.44–1.00)
Calcium: 8.9 mg/dL (ref 8.9–10.3)
GFR calc Af Amer: >60 mL/min (ref >60)
GFR calc non Af Amer: >60 mL/min (ref >60)
Glucose, Bld: 121 mg/dL — ABNORMAL HIGH (ref 65–99)
POTASSIUM: 4.2 mmol/L (ref 3.5–5.1)
Sodium: 139 mmol/L (ref 135–145)

## 2018-03-31 LAB — CBC
HCT: 36.6 % (ref 36.0–46.0)
HEMOGLOBIN: 11.6 g/dL — AB (ref 12.0–15.0)
MCH: 29.1 pg (ref 26.0–34.0)
MCHC: 31.7 g/dL (ref 30.0–36.0)
MCV: 92 fL (ref 78.0–100.0)
Platelets: 306 10*3/uL (ref 150–400)
RBC: 3.98 MIL/uL (ref 3.87–5.11)
RDW: 19.8 % — ABNORMAL HIGH (ref 11.5–15.5)
WBC: 11.9 10*3/uL — ABNORMAL HIGH (ref 4.0–10.5)

## 2018-03-31 LAB — MAGNESIUM: MAGNESIUM: 2 mg/dL (ref 1.7–2.4)

## 2018-03-31 MED ORDER — AZITHROMYCIN 250 MG PO TABS: 250.0000 mg | ORAL_TABLET | Freq: Every day | ORAL | 0 refills | Status: AC

## 2018-03-31 MED ORDER — PREDNISONE 10 MG PO TABS: ORAL_TABLET | ORAL | 0 refills | Status: DC

## 2018-03-31 MED ORDER — MOMETASONE FURO-FORMOTEROL FUM 100-5 MCG/ACT IN AERO: 2.0000 | INHALATION_SPRAY | Freq: Two times a day (BID) | RESPIRATORY_TRACT | 0 refills | Status: DC

## 2018-03-31 NOTE — Discharge Summary (Signed)
Physician Discharge Summary  Elizabeth Villarreal JXB:[id number] DOB: 16-Jan-1971 DOA: 03/28/2018  PCP: Marcine Matar, MD  Admit date: 03/28/2018 Discharge date: 03/31/2018  Admitted From: Home Disposition:  Home  Recommendations for Outpatient Follow-up:  1. Follow up with PCP in 1 week  Discharge Condition: Stable CODE STATUS: Full  Diet recommendation: Heart healthy   Brief/Interim Summary: Elizabeth Villarreal is a 47 year old female with history of COPD, former smoker quit in February 2019 comes to the hospital with complaints of progressively worsening shortness of breath over the course of last 24 hours prior to her admission. Patient states she has couple of pets at home and then yesterday she went out with her daughter as it was her birthday and thinks she was exposed to excessive amount of pollen. And also due to weather changes she started experiencing some chest tightness along with some shortness of breath. After going home, she started using her nebulizer without any help therefore came to the hospital for evaluation. Initially she was noted to be very hypoxic in the ER saturating 80 percent therefore had to be placed on BiPAP and was started on routine breathing treatment.  She was admitted for further treatment and evaluation of COPD exacerbation.  Discharge Diagnoses:  Principal Problem:   Respiratory failure with hypoxia and hypercapnia (HCC) Active Problems:   COPD with acute exacerbation (HCC)   Leukocytosis  Acute respiratory failure with hypercapnia and hypoxia secondary to COPD exacerbation -Currently she is off BiPAP. On 3 L nasal cannula, wean as able.  Home O2 desaturation screen did not qualify her for home Ox  -Wean steroids  -Continue nebulizer treatments -Start Dulera -Azithromycin 5 days   Reactive leukocytosis -On steroids, monitor CBC. Improved today.   14 mm RIGHT lower lobe pulmonary nodule with central calcification most compatible with healing  granuloma -No routine indicated follow-up by consensus.  History of depression -Follow up outpatient at behavioral health  Severe protein calorie malnutrition -Dietitian consulted    Discharge Instructions  Discharge Instructions    Call MD for:  difficulty breathing, headache or visual disturbances   Complete by:  As directed    Call MD for:  extreme fatigue   Complete by:  As directed    Call MD for:  hives   Complete by:  As directed    Call MD for:  persistant dizziness or light-headedness   Complete by:  As directed    Call MD for:  persistant nausea and vomiting   Complete by:  As directed    Call MD for:  severe uncontrolled pain   Complete by:  As directed    Call MD for:  temperature >100.4   Complete by:  As directed    Diet - low sodium heart healthy   Complete by:  As directed    Discharge instructions   Complete by:  As directed    You were cared for by a hospitalist during your hospital stay. If you have any questions about your discharge medications or the care you received while you were in the hospital after you are discharged, you can call the unit and asked to speak with the hospitalist on call if the hospitalist that took care of you is not available. Once you are discharged, your primary care physician will handle any further medical issues. Please note that NO REFILLS for any discharge medications will be authorized once you are discharged, as it is imperative that you return to your primary care physician (or establish a  relationship with a primary care physician if you do not have one) for your aftercare needs so that they can reassess your need for medications and monitor your lab values.   Increase activity slowly   Complete by:  As directed      Allergies as of 03/31/2018      Reactions   Darvon [propoxyphene] Other (See Comments)   Patient was aware her spoken words were not matching her thoughts   Percocet [oxycodone-acetaminophen] Other (See  Comments)   Patient was aware her spoken words were not matching her thoughts      Medication List    STOP taking these medications   levofloxacin 500 MG tablet Commonly known as:  LEVAQUIN     TAKE these medications   acetaminophen 325 MG tablet Commonly known as:  TYLENOL Take 650 mg by mouth every 6 (six) hours as needed for mild pain.   albuterol (2.5 MG/3ML) 0.083% nebulizer solution Commonly known as:  PROVENTIL Take 3 mLs (2.5 mg total) by nebulization 4 (four) times daily. With the ipratropium   albuterol 108 (90 Base) MCG/ACT inhaler Commonly known as:  PROAIR HFA Inhale 1-2 puffs into the lungs every 6 (six) hours as needed for wheezing or shortness of breath.   azithromycin 250 MG tablet Commonly known as:  ZITHROMAX Take 1 tablet (250 mg total) by mouth daily for 3 days.   ipratropium 0.02 % nebulizer solution Commonly known as:  ATROVENT Take 2.5 mLs (0.5 mg total) by nebulization 4 (four) times daily.   mometasone-formoterol 100-5 MCG/ACT Aero Commonly known as:  DULERA Inhale 2 puffs into the lungs 2 (two) times daily.   predniSONE 10 MG tablet Commonly known as:  DELTASONE Take 4 tabs for 3 days, then 3 tabs for 3 days, then 2 tabs for 3 days, then 1 tab for 3 days, then 1/2 tab for 4 days. What changed:  additional instructions       Allergies  Allergen Reactions  . Darvon [Propoxyphene] Other (See Comments)    Patient was aware her spoken words were not matching her thoughts  . Percocet [Oxycodone-Acetaminophen] Other (See Comments)    Patient was aware her spoken words were not matching her thoughts    Consultations:  None   Procedures/Studies: Dg Chest Portable 1 View  Result Date: 03/28/2018 CLINICAL DATA:  Dyspnea and COPD.  Cough x2 days.  Former smoker. EXAM: PORTABLE CHEST 1 VIEW COMPARISON:  02/19/2018 FINDINGS: The lateral aspect of the left lung base is excluded on this portable study. Redemonstration of marked emphysematous  hyperinflation of the lungs without acute pulmonary consolidation, effusion or pneumothorax. Heart and mediastinal contours are stable without aneurysm. A rounded density overlying the right fifth rib is believed to be costochondral in etiology. IMPRESSION: Emphysematous appearance of the lungs consistent with COPD. No acute pulmonary consolidation or CHF. Electronically Signed   By: Tollie Eth M.D.   On: 03/28/2018 01:52      Discharge Exam: Vitals:   03/31/18 0838 03/31/18 0919  BP:  105/69  Pulse:  94  Resp:  16  Temp:  97.9 F (36.6 C)  SpO2: 95% 94%     General: Pt is alert, awake, not in acute distress Cardiovascular: RRR, S1/S2 +, no rubs, no gallops Respiratory: Decreased breath sounds, no wheezing, no rhonchi, no respiratory distress, no conversational dyspnea  Abdominal: Soft, NT, ND, bowel sounds + Extremities: no edema, no cyanosis    The results of significant diagnostics from this hospitalization (including  imaging, microbiology, ancillary and laboratory) are listed below for reference.     Microbiology: Recent Results (from the past 240 hour(s))  MRSA PCR Screening     Status: None   Collection Time: 03/28/18  4:06 PM  Result Value Ref Range Status   MRSA by PCR NEGATIVE NEGATIVE Final    Comment:        The GeneXpert MRSA Assay (FDA approved for NASAL specimens only), is one component of a comprehensive MRSA colonization surveillance program. It is not intended to diagnose MRSA infection nor to guide or monitor treatment for MRSA infections. Performed at [facility], [address] [place], [place] [address]      Labs: BNP (last 3 results) No results for input(s): BNP in the last 8760 hours. Basic Metabolic Panel: Recent Labs  Lab 03/28/18 0058 03/29/18 0510 03/30/18 0244 03/31/18 0326  NA 139 139 139 139  K 4.2 4.2 4.8 4.2  CL 106 108 106 102  CO2 GLUCOSE 122* 139* 140* 121*  BUN CREATININE 0.63 0.55 0.65  0.55  CALCIUM 9.1 9.3 9.1 8.9  MG  --  2.0 2.0 2.0   Liver Function Tests: No results for input(s): AST, ALT, ALKPHOS, BILITOT, PROT, ALBUMIN in the last 168 hours. No results for input(s): LIPASE, AMYLASE in the last 168 hours. No results for input(s): AMMONIA in the last 168 hours. CBC: Recent Labs  Lab 03/28/18 0058 03/29/18 0510 03/30/18 0244 03/31/18 0326  WBC 13.0* 15.6* 15.0* 11.9*  HGB 13.6 12.1 12.0 11.6*  HCT 42.2 37.5 38.2 36.6  MCV 91.5 91.2 92.7 92.0  PLT 303 282 320 306   Cardiac Enzymes: No results for input(s): CKTOTAL, CKMB, CKMBINDEX, TROPONINI in the last 168 hours. BNP: Invalid input(s): POCBNP CBG: No results for input(s): GLUCAP in the last 168 hours. D-Dimer No results for input(s): DDIMER in the last 72 hours. Hgb A1c No results for input(s): HGBA1C in the last 72 hours. Lipid Profile No results for input(s): CHOL, HDL, LDLCALC, TRIG, CHOLHDL, LDLDIRECT in the last 72 hours. Thyroid function studies No results for input(s): TSH, T4TOTAL, T3FREE, THYROIDAB in the last 72 hours.  Invalid input(s): FREET3 Anemia work up No results for input(s): VITAMINB12, FOLATE, FERRITIN, TIBC, IRON, RETICCTPCT in the last 72 hours. Urinalysis No results found for: COLORURINE, APPEARANCEUR, LABSPEC, PHURINE, GLUCOSEU, HGBUR, BILIRUBINUR, KETONESUR, PROTEINUR, UROBILINOGEN, NITRITE, LEUKOCYTESUR Sepsis Labs Invalid input(s): PROCALCITONIN,  WBC,  LACTICIDVEN Microbiology Recent Results (from the past 240 hour(s))  MRSA PCR Screening     Status: None   Collection Time: 03/28/18  4:06 PM  Result Value Ref Range Status   MRSA by PCR NEGATIVE NEGATIVE Final    Comment:        The GeneXpert MRSA Assay (FDA approved for NASAL specimens only), is one component of a comprehensive MRSA colonization surveillance program. It is not intended to diagnose MRSA infection nor to guide or monitor treatment for MRSA infections. Performed at [facility], [address], [place], [place] [address]      Patient was seen and examined on the day of discharge and was found to be in stable condition. Time coordinating discharge: 25 minutes including assessment and coordination of care, as well as examination of the patient.   SIGNED:  Noralee Stain, DO [facility] Pager [phone]  If 7PM-7AM, please contact night-coverage [email or web address] Password [email or web address] 03/31/2018, 11:01 AM

## 2018-03-31 NOTE — Progress Notes (Signed)
Pt d/c home per MD order, d/c instructions given, family at [facility], all questions answered

## 2018-04-02 MED FILL — ALBUTEROL 0.083% INHAL SOLN: (2.5 MG/3ML | 6 days supply | Qty: 90 | Fill #5

## 2018-04-05 ENCOUNTER — Encounter: Payer: Self-pay | Admitting: Internal Medicine

## 2018-04-05 ENCOUNTER — Ambulatory Visit: Payer: Medicaid Other | Attending: Internal Medicine | Admitting: Internal Medicine

## 2018-04-05 VITALS — BP 103/71 | HR 92 | Temp 97.8°F | Resp 16 | Ht 68.0 in | Wt 112.0 lb

## 2018-04-05 DIAGNOSIS — Z87891 Personal history of nicotine dependence: Secondary | ICD-10-CM | POA: Diagnosis not present

## 2018-04-05 DIAGNOSIS — Z79899 Other long term (current) drug therapy: Secondary | ICD-10-CM | POA: Insufficient documentation

## 2018-04-05 DIAGNOSIS — Z885 Allergy status to narcotic agent status: Secondary | ICD-10-CM | POA: Diagnosis not present

## 2018-04-05 DIAGNOSIS — F329 Major depressive disorder, single episode, unspecified: Secondary | ICD-10-CM | POA: Diagnosis not present

## 2018-04-05 DIAGNOSIS — J449 Chronic obstructive pulmonary disease, unspecified: Secondary | ICD-10-CM | POA: Insufficient documentation

## 2018-04-05 MED ORDER — MOMETASONE FURO-FORMOTEROL FUM 100-5 MCG/ACT IN AERO: 2.0000 | INHALATION_SPRAY | Freq: Two times a day (BID) | RESPIRATORY_TRACT | 12 refills | Status: DC

## 2018-04-05 MED ORDER — ALBUTEROL SULFATE HFA 108 (90 BASE) MCG/ACT IN AERS: 1.0000 | INHALATION_SPRAY | Freq: Four times a day (QID) | RESPIRATORY_TRACT | 12 refills | Status: DC | PRN

## 2018-04-05 MED ORDER — ALBUTEROL SULFATE (2.5 MG/3ML) 0.083% IN NEBU: 2.5000 mg | INHALATION_SOLUTION | Freq: Four times a day (QID) | RESPIRATORY_TRACT | 6 refills | Status: DC

## 2018-04-05 MED FILL — ALBUTEROL 0.083% INHAL SOLN: (2.5 MG/3ML | 30 days supply | Qty: 360 | Fill #0 | Status: TO

## 2018-04-05 MED FILL — PROAIR HFA 90 MCG INHALER: 108 (90 BAS | 25 days supply | Qty: 9 | Fill #0 | Status: TO

## 2018-04-05 NOTE — Progress Notes (Signed)
Patient ID: Elizabeth Villarreal, female    DOB: May 25, 1971  MRN: [id number]  CC: Establish Care and COPD   Subjective: Elizabeth Villarreal is a 47 y.o. female who presents for hosp f/u and to est care with me as PCP.  Daughter is with her. Her concerns today include:  Pt with hx of COPD, tob dep and depression   Patient has been hospitalized 3 times for COPD exacerbation since the beginning of this year.  She has seen pulmonologist, Dr. Delford Field, a few times here since then.  Recently hospitalized again for/24-20 06/2018. Breathing has been managable since discharge "but I have not push myself as yet."  On Prednisone taper; completed Zithromax. New med - Dulera.  Using Albuterol MDI and mned albuterol/atrovent QID.  Requests prescription for the albuterol mini nebs to be increased such that she is getting 1 month supply as she tends to run out after 7 to 10 days. No cig since 01/2018 Never really had problem with spring time allergies Just moved into a third floor apartment - has 2 flights up. -over night ox ordered by Dr. Delford Field.  She has not been called. Suppose to be through [employer] -Patient wonders whether she needs to be on oxygen.  When she was tested on this last hospitalization she reports being checked right after she had a nebulizer treatment and oxygen level was between 89-90.      Patient Active Problem List   Diagnosis Date Noted  . Respiratory failure with hypoxia and hypercapnia (HCC) 03/28/2018  . Leukocytosis 03/28/2018  . Chronic respiratory failure with hypoxia (HCC) 03/07/2018  . Lung nodule seen on imaging study 03/07/2018  . COPD with acute exacerbation (HCC) 02/19/2018  . Depression 02/07/2018  . History of tobacco use 01/29/2018  . COPD with acute bronchitis (HCC) GOLD D  01/29/2018  . Protein-calorie malnutrition, severe (HCC) 01/29/2018     Current Outpatient Medications on File Prior to Visit  Medication Sig Dispense Refill  . acetaminophen (TYLENOL) 325 MG tablet Take 650  mg by mouth every 6 (six) hours as needed for mild pain.    Marland Kitchen ipratropium (ATROVENT) 0.02 % nebulizer solution Take 2.5 mLs (0.5 mg total) by nebulization 4 (four) times daily. 120 mL 6  . predniSONE (DELTASONE) 10 MG tablet Take 4 tabs for 3 days, then 3 tabs for 3 days, then 2 tabs for 3 days, then 1 tab for 3 days, then 1/2 tab for 4 days. (Patient not taking: Reported on 04/05/2018) 32 tablet 0   No current facility-administered medications on file prior to visit.     Allergies  Allergen Reactions  . Darvon [Propoxyphene] Other (See Comments)    Patient was aware her spoken words were not matching her thoughts  . Percocet [Oxycodone-Acetaminophen] Other (See Comments)    Patient was aware her spoken words were not matching her thoughts    Social History   Socioeconomic History  . Marital status: Single    Spouse name: Not on file  . Number of children: Not on file  . Years of education: Not on file  . Highest education level: Not on file  Occupational History  . Not on file  Social Needs  . Financial resource strain: Not on file  . Food insecurity:    Worry: Not on file    Inability: Not on file  . Transportation needs:    Medical: Not on file    Non-medical: Not on file  Tobacco Use  . Smoking status:  Former Smoker    Packs/day: 1.00    Types: Cigarettes    Last attempt to quit: 01/15/2018    Years since quitting: 0.2  . Smokeless tobacco: Never Used  Substance and Sexual Activity  . Alcohol use: No  . Drug use: No  . Sexual activity: Not Currently    Birth control/protection: None  Lifestyle  . Physical activity:    Days per week: Not on file    Minutes per session: Not on file  . Stress: Not on file  Relationships  . Social connections:    Talks on phone: Not on file    Gets together: Not on file    Attends religious service: Not on file    Active member of club or organization: Not on file    Attends meetings of clubs or organizations: Not on file     Relationship status: Not on file  . Intimate partner violence:    Fear of current or ex partner: Not on file    Emotionally abused: Not on file    Physically abused: Not on file    Forced sexual activity: Not on file  Other Topics Concern  . Not on file  Social History Narrative  . Not on file    Family History  Problem Relation Age of Onset  . COPD Mother   . Lung cancer Father     Past Surgical History:  Procedure Laterality Date  . CESAREAN SECTION    . ECTOPIC PREGNANCY SURGERY    . ovarian cyst rupture    . ovary cyst rupture      ROS: Review of Systems Negative except as stated above   PHYSICAL EXAM: BP 103/71   Pulse 92   Temp 97.8 F (36.6 C) (Oral)   Resp 16   Ht  (1.727 m)   Wt 112 lb (50.8 kg)   LMP 03/14/2018   SpO2 93%   BMI 17.03 kg/m   Wt Readings from Last 3 Encounters:  04/05/18 112 lb (50.8 kg)  03/28/18 110 lb 10.7 oz (50.2 kg)  03/07/18 108 lb 6.4 oz (49.2 kg)   Pulse ox room air sitting 96%.  Room air during exertion lowest was 95% Physical Exam  General appearance - alert, well appearing, and in no distress Mental status - normal mood, behavior, speech, dress, motor activity, and thought processes Neck - supple, no significant adenopathy Chest -slightly decreased bilaterally but with good air entry Heart - normal rate, regular rhythm, normal S1, S2, no murmurs, rubs, clicks or gallops Extremities - peripheral pulses normal, no pedal edema, no clubbing or cyanosis  Depression screen [facility] 2/9 04/05/2018 03/07/2018 02/07/2018  Decreased Interest 1 0 0  Down, Depressed, Hopeless PHQ - 2 Score Altered sleeping Tired, decreased energy Change in appetite Feeling bad or failure about yourself  Trouble concentrating 1 1 0  Moving slowly or fidgety/restless (No Data) 1 2  Suicidal thoughts 0 0 0  PHQ-9 Score ASSESSMENT AND PLAN: 1. Chronic obstructive pulmonary disease,  unspecified COPD type (HCC) -Refill given on Dulera.  Patient advised to rinse mouth out with warm water after each use. I rewrote the prescription for the albuterol nebulizer to reflect the amount she is using in a month.  Hopefully this would decrease with use of Dulera. -We will ask  case manager to look into the overnight pulse oximetry that patient reports was ordered by Dr. Delford Field. -May benefit from pulmonary rehab Keep appointment with Dr. Delford Field next week  Patient was given the opportunity to ask questions.  Patient verbalized understanding of the plan and was able to repeat key elements of the plan.   No orders of the defined types were placed in this encounter.    Requested Prescriptions   Signed Prescriptions Disp Refills  . albuterol (PROVENTIL) (2.5 MG/3ML) 0.083% nebulizer solution 360 mL 6    Sig: Take 3 mLs (2.5 mg total) by nebulization 4 (four) times daily. With the ipratropium  . mometasone-formoterol (DULERA) 100-5 MCG/ACT AERO 1 Inhaler 12    Sig: Inhale 2 puffs into the lungs 2 (two) times daily.  Marland Kitchen albuterol (PROAIR HFA) 108 (90 Base) MCG/ACT inhaler 1 Inhaler 12    Sig: Inhale 1-2 puffs into the lungs every 6 (six) hours as needed for wheezing or shortness of breath.    Return in about 6 weeks (around 05/17/2018) for PAP.  Jonah Blue, MD, FACP

## 2018-04-06 ENCOUNTER — Encounter: Payer: Self-pay | Admitting: Physical Therapy

## 2018-04-06 ENCOUNTER — Ambulatory Visit: Payer: Medicaid Other | Attending: Internal Medicine | Admitting: Physical Therapy

## 2018-04-06 DIAGNOSIS — M6281 Muscle weakness (generalized): Secondary | ICD-10-CM | POA: Diagnosis present

## 2018-04-06 DIAGNOSIS — R2689 Other abnormalities of gait and mobility: Secondary | ICD-10-CM | POA: Diagnosis not present

## 2018-04-06 NOTE — Therapy (Signed)
[facility] [address] [place], [place], [address] Phone: [phone]   Fax:  [phone]  Physical Therapy Treatment  Patient Details  Name: Elizabeth Villarreal MRN: [id number] Date of Birth: 1971-08-10 Referring Provider: Dr. Gerlean Ren   Encounter Date: 04/06/2018  PT End of Session - 04/06/18 1408    Visit Number  3    Number of Visits  4    Authorization Type  Medicaid approved 3 visits from 4/15-04/08/18    Authorization - Visit Number  2    Authorization - Number of Visits  3    PT Start Time  1024    PT Stop Time  1106    PT Time Calculation (min)  42 min    Activity Tolerance  Patient tolerated treatment well    Behavior During Therapy  [facility] for tasks assessed/performed       Past Medical History:  Diagnosis Date  . COPD (chronic obstructive pulmonary disease) ([place])   . Depression   . Tobacco abuse     Past Surgical History:  Procedure Laterality Date  . CESAREAN SECTION    . ECTOPIC PREGNANCY SURGERY    . ovarian cyst rupture    . ovary cyst rupture      There were no vitals filed for this visit.  Subjective Assessment - 04/06/18 1352    Subjective  Pt states she was hospitalized 4-24 -03-31-18 with COPD exacerbation; states the MD told her to continue therapy (on discharge instructions);  has still not heard from [employer] for oxygen for overnight oximetry test to be done prior to her appt with Dr. Joya Gaskins on 04-11-18 ; pt states she is now on Prednisone and Dulera which is helping alot                                                                                                                                                                   Patient is accompained by:  Family member    Pertinent History  COPD, PNA, depression, hx of smoking    Currently in Pain?  No/denies                 O2 rate at rest prior to start of PT session = 96%, HR 84 bpm      [facility] Adult PT Treatment/Exercise -  04/06/18 1036      Transfers   Transfers  Sit to Stand    Sit to Stand  7: Independent      Ambulation/Gait   Ambulation/Gait  Yes    Ambulation/Gait Assistance  5: Supervision    Ambulation/Gait Assistance Details  no LOB occurred    Ambulation Distance (Feet)  1304.2 Feet 6" walk test    Assistive device  None    Gait Pattern  Within Functional Limits    Ambulation Surface  Level;Indoor    Gait Comments  Pt gait trained outside 1000' on pavement and approx. 100' on grassy terrain; this was done after 6" walk test was completed indoors ;  O2 95% and HR 95% after this ambulation distance       Neuro Re-ed    Neuro Re-ed Details   Pt performed SLS on each leg  > 10 secs;  pt performed tandem stance 30 secs with Rt leg in front; 18 secs with LLE in front       Exercises   Exercises  Knee/Hip      Knee/Hip Exercises: Aerobic   Recumbent Bike  SciFit level 2.0 x 5' with UE's and LE's O2 rate 96% after this activity      O2 rate after 6" walk test = 95% , HR 97 bpm       PT Education - 04/06/18 1406    Education provided  Yes    Education Details  Reviewed STG's and discussed progress achieved towards goals with pt;  recommended pt to add SLS and tandem stance for balance to current HEP;  discussed continuation of PT vs. pulmonary rehab as pulmonary rehab was mentioned in MD's office visit note    Person(s) Educated  Patient    Methods  Explanation;Demonstration    Comprehension  Verbalized understanding;Returned demonstration       PT Short Term Goals - 04/06/18 1354      PT SHORT TERM GOAL #1   Title  Pt will be IND in HEP to improve endurance, balance, and strength. TARGET DATE FOR ALL STGS: after 3 visits    Baseline  Pt is now walking up flight of stairs to her apartment - is not doing exercises regularly due to recent hospitalization 4-24 - 03-31-18   -     04-06-18    Status  On-going      PT SHORT TERM GOAL #2   Title  Pt will improve 6MWT distance by 100' to improve  endurance.     Baseline  1340' no AD;  1304.2' with no device on 04-06-18    Status  Not Met      PT SHORT TERM GOAL #3   Title  Trial amb. over uneven terrain and write goal as indicated.     Baseline  100' over even terrain with S to ensure safety. - goal met 04-06-18 - pt amb. outside on pavement/grass 1000' with S    Status  Achieved      PT SHORT TERM GOAL #4   Title  Pt will amb. 1000' over even terrain with SaO2 on room air >/=92% to improve endurance and safety during functional mobility.     Baseline  1000' outside on pavement and approx. 100' on grass with S = O2 rate 95%;  HR 95 bpm after ambulation    Status  Achieved        PT Long Term Goals - 03/12/18 1159      PT LONG TERM GOAL #1   Title  Pt will improve FGA score to >/=29/30 to decr. falls risk. TARGET DATE FOR ALL LTGS: after 11 visits    Baseline  24/30    Status  New      PT LONG TERM GOAL #2   Title  Pt will improve 6MWT distance to >/=1517' to improve endurance.     Baseline  1340'  Status  New      PT LONG TERM GOAL #3   Title  Pt will be IND in progressed HEP to improve endurance, balance, and strength.     Baseline  No HEP established    Status  New      PT LONG TERM GOAL #4   Title  Pt will traverse 3 flights of steps with one handrail, no LOB, at MOD I level in order to traverse steps at home.     Baseline  4 steps with one handrail to descend    Status  New            Plan - 04/06/18 1413    Clinical Impression Statement  Pt has partially met STG #1, STG #2 not met and has met STG's #3 and 4:  pt was hospitalized 4-24 - 27-19 and is now on prednisone and Dulera (steroids) so she was able tolerate amb. with O2 sat rates remaining consistently in the 90's.  No LOB occurred with ambualtion inside or outside.  Pt may benefit from pulmonary rehab - this was mentioned in the PCP's office visit note on 04-05-18.                 Rehab Potential  Good    Clinical Impairments Affecting Rehab Potential   see above.     PT Frequency  1x / week    PT Duration  3 weeks    PT Treatment/Interventions  ADLs/Self Care Home Management;Biofeedback;Therapeutic exercise;Therapeutic activities;Manual techniques;Vestibular;Functional mobility training;Stair training;Gait training;DME Instruction;Neuromuscular re-education;Balance training;Patient/family education    PT Next Visit Plan  continue strengthening and aerobic activities as pt able to tolerate; inquire about pulmonary rehab for pt - renewal needed if she continues with PT as Medicaid end date is 04-08-18    Consulted and Agree with Plan of Care  Patient       Patient will benefit from skilled therapeutic intervention in order to improve the following deficits and impairments:  Abnormal gait, Cardiopulmonary status limiting activity, Decreased endurance, Decreased knowledge of use of DME, Decreased strength, Decreased balance, Decreased mobility, Postural dysfunction  Visit Diagnosis: Other abnormalities of gait and mobility  Muscle weakness (generalized)     Problem List Patient Active Problem List   Diagnosis Date Noted  . Respiratory failure with hypoxia and hypercapnia ([place]) 03/28/2018  . Leukocytosis 03/28/2018  . Chronic respiratory failure with hypoxia ([place]) 03/07/2018  . Lung nodule seen on imaging study 03/07/2018  . Depression 02/07/2018  . History of tobacco use 01/29/2018  . Chronic obstructive pulmonary disease ([place]) 01/29/2018  . Protein-calorie malnutrition, severe ([place]) 01/29/2018    Elizabeth Villarreal, [place], PT 04/06/2018, 2:22 PM  [facility] [address] [place], [place], [address] Phone: [phone]   Fax:  [phone]  Name: Elizabeth Villarreal MRN: [id number] Date of Birth: [birth year]

## 2018-04-09 ENCOUNTER — Telehealth: Payer: Self-pay

## 2018-04-09 NOTE — Telephone Encounter (Signed)
Call placed to [employer], spoke to [place] and she confirmed receipt of the order. She stated that the patient has been called and the [employer] rep spoke to the patient's daughter and has scheduled shipment of the machine.

## 2018-04-09 NOTE — Telephone Encounter (Signed)
Call placed to [employer] # [phone]  to inquire about the status of the order for overnight pulse ox. Spoke to [place] who stated that an order was never received and they have no record of the patient in their system. Morrie Sheldon provided her direct fax # [phone] and the order was faxed to her attention.

## 2018-04-11 ENCOUNTER — Ambulatory Visit: Payer: Medicaid Other | Attending: Critical Care Medicine | Admitting: Critical Care Medicine

## 2018-04-11 ENCOUNTER — Encounter: Payer: Self-pay | Admitting: Critical Care Medicine

## 2018-04-11 ENCOUNTER — Ambulatory Visit: Payer: Medicaid Other | Attending: Family Medicine | Admitting: Licensed Clinical Social Worker

## 2018-04-11 VITALS — BP 107/73 | HR 109 | Temp 98.6°F | Resp 16 | Ht 69.0 in | Wt 114.0 lb

## 2018-04-11 DIAGNOSIS — F330 Major depressive disorder, recurrent, mild: Secondary | ICD-10-CM | POA: Insufficient documentation

## 2018-04-11 DIAGNOSIS — J449 Chronic obstructive pulmonary disease, unspecified: Secondary | ICD-10-CM | POA: Diagnosis not present

## 2018-04-11 DIAGNOSIS — Z79899 Other long term (current) drug therapy: Secondary | ICD-10-CM | POA: Insufficient documentation

## 2018-04-11 DIAGNOSIS — E43 Unspecified severe protein-calorie malnutrition: Secondary | ICD-10-CM | POA: Diagnosis not present

## 2018-04-11 DIAGNOSIS — J189 Pneumonia, unspecified organism: Secondary | ICD-10-CM | POA: Diagnosis not present

## 2018-04-11 DIAGNOSIS — F332 Major depressive disorder, recurrent severe without psychotic features: Secondary | ICD-10-CM

## 2018-04-11 DIAGNOSIS — J9611 Chronic respiratory failure with hypoxia: Secondary | ICD-10-CM | POA: Diagnosis not present

## 2018-04-11 DIAGNOSIS — J441 Chronic obstructive pulmonary disease with (acute) exacerbation: Secondary | ICD-10-CM | POA: Insufficient documentation

## 2018-04-11 DIAGNOSIS — J440 Chronic obstructive pulmonary disease with acute lower respiratory infection: Secondary | ICD-10-CM | POA: Diagnosis present

## 2018-04-11 DIAGNOSIS — Z87891 Personal history of nicotine dependence: Secondary | ICD-10-CM

## 2018-04-11 DIAGNOSIS — J42 Unspecified chronic bronchitis: Secondary | ICD-10-CM | POA: Diagnosis not present

## 2018-04-11 DIAGNOSIS — F419 Anxiety disorder, unspecified: Secondary | ICD-10-CM

## 2018-04-11 DIAGNOSIS — J4550 Severe persistent asthma, uncomplicated: Secondary | ICD-10-CM | POA: Diagnosis not present

## 2018-04-11 DIAGNOSIS — Z77120 Contact with and (suspected) exposure to mold (toxic): Secondary | ICD-10-CM | POA: Insufficient documentation

## 2018-04-11 DIAGNOSIS — F418 Other specified anxiety disorders: Secondary | ICD-10-CM | POA: Diagnosis not present

## 2018-04-11 MED ORDER — IPRATROPIUM BROMIDE 0.02 % IN SOLN: 0.5000 mg | Freq: Four times a day (QID) | RESPIRATORY_TRACT | 6 refills | Status: DC

## 2018-04-11 MED ORDER — LORATADINE 10 MG PO TABS: 10.0000 mg | ORAL_TABLET | Freq: Every day | ORAL | 11 refills | Status: DC

## 2018-04-11 MED ORDER — ALBUTEROL SULFATE (2.5 MG/3ML) 0.083% IN NEBU: 2.5000 mg | INHALATION_SOLUTION | Freq: Four times a day (QID) | RESPIRATORY_TRACT | 6 refills | Status: DC | PRN

## 2018-04-11 MED ORDER — ENSURE ACTIVE PO LIQD: 20.0000 mL | Freq: Every day | ORAL | 6 refills | Status: DC

## 2018-04-11 MED FILL — IPRATROPIUM BR 0.02% SOLN: 0.02 | 12 days supply | Qty: 5 | Fill #3 | Status: TO
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
## 2018-04-11 NOTE — Patient Instructions (Signed)
A referral to pulmonary rehab will be made Refills on [facility] sent to pharmacy Change albuterol nebulizer to 4 times daily as needed Obtain a peak flow meter and record results 3 times daily Finish prednisone Use ipratroprium 4 times daily A social work consult will be made Return May 22

## 2018-04-11 NOTE — Progress Notes (Signed)
Overnight pulse oximeter not been delivered. Pt is very tearful today.

## 2018-04-12 DIAGNOSIS — J45909 Unspecified asthma, uncomplicated: Secondary | ICD-10-CM | POA: Insufficient documentation

## 2018-04-12 NOTE — Assessment & Plan Note (Addendum)
Gold Stage D COPD with emphysematous and asthmatic bronchitic components. Airways very hyperreactive, little reserve, prior Mold exposure. Significant anxiety depression overlay Multiple ED /hosp visits Plan Cont Dulera two puff bid, obtain spacer Cont scheduled ipratroprium qid by neb med Prn SABA HFA techique good and redemonstrated Finish pred course Obtain Peak flow meter: see asthmatic assessment  Pulmonary rehab referral

## 2018-04-12 NOTE — Assessment & Plan Note (Signed)
Chronic hypoxic resp failure.  I ? Nocturnal hypoxia ONO not yet completed.  Will receive the device next week

## 2018-04-12 NOTE — Assessment & Plan Note (Signed)
PCM, would benefit from ensure

## 2018-04-12 NOTE — Assessment & Plan Note (Signed)
Asthmatic component,  Now on dulera Plan  Taper pred Cont dulera Obtain PFM:  Best ever 250  Yellow 180- 120  Red <120

## 2018-04-12 NOTE — Assessment & Plan Note (Signed)
Not currently smoking 

## 2018-04-12 NOTE — Telephone Encounter (Signed)
Dahlia Client from [facility] called stating that patient was referred to them so she could get in the Pulmonary undergrad program. Patient has Medicaid and the insurance will not cover the program. Patients Medicaid will the Pulmonary Maintenance Program but the Rehab center needs a new referral. Dahlia Client also stated that only one diagnosis code can be put in the referral, either the Chronic Respiratory or the COPD. Please f/u.

## 2018-04-12 NOTE — Assessment & Plan Note (Signed)
Significant depression and anxiety Social service referral to consider Behav health, will also let PCP know
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
## 2018-04-13 NOTE — Telephone Encounter (Signed)
Elizabeth Villarreal.  Can you put another order in for pulmonary rehab for ms Elizabeth Villarreal   Dx is chronic hypoxic respiratory failure

## 2018-04-16 ENCOUNTER — Other Ambulatory Visit: Payer: Self-pay | Admitting: *Deleted

## 2018-04-16 ENCOUNTER — Telehealth: Payer: Self-pay | Admitting: Internal Medicine

## 2018-04-16 DIAGNOSIS — J9611 Chronic respiratory failure with hypoxia: Secondary | ICD-10-CM

## 2018-04-16 NOTE — Telephone Encounter (Signed)
Elizabeth Villarreal Client from cardiac rehab called and requested for a pulmonary maintenance request to be placed due to patient having medicaid.

## 2018-04-19 ENCOUNTER — Other Ambulatory Visit: Payer: Self-pay | Admitting: Critical Care Medicine

## 2018-04-19 DIAGNOSIS — J449 Chronic obstructive pulmonary disease, unspecified: Secondary | ICD-10-CM

## 2018-04-19 NOTE — Telephone Encounter (Signed)
I have sent the order now

## 2018-04-19 NOTE — Progress Notes (Signed)
Pulm rehab mtn ordered

## 2018-04-25 ENCOUNTER — Telehealth: Payer: Self-pay

## 2018-04-25 ENCOUNTER — Ambulatory Visit: Payer: Medicaid Other | Attending: Internal Medicine | Admitting: Licensed Clinical Social Worker

## 2018-04-25 ENCOUNTER — Encounter: Payer: Self-pay | Admitting: Critical Care Medicine

## 2018-04-25 ENCOUNTER — Ambulatory Visit: Payer: Medicaid Other | Attending: Critical Care Medicine | Admitting: Critical Care Medicine

## 2018-04-25 DIAGNOSIS — J4550 Severe persistent asthma, uncomplicated: Secondary | ICD-10-CM | POA: Diagnosis not present

## 2018-04-25 DIAGNOSIS — F329 Major depressive disorder, single episode, unspecified: Secondary | ICD-10-CM | POA: Diagnosis not present

## 2018-04-25 DIAGNOSIS — Z79899 Other long term (current) drug therapy: Secondary | ICD-10-CM | POA: Diagnosis not present

## 2018-04-25 DIAGNOSIS — Z885 Allergy status to narcotic agent status: Secondary | ICD-10-CM | POA: Diagnosis not present

## 2018-04-25 DIAGNOSIS — F331 Major depressive disorder, recurrent, moderate: Secondary | ICD-10-CM

## 2018-04-25 DIAGNOSIS — J9611 Chronic respiratory failure with hypoxia: Secondary | ICD-10-CM | POA: Insufficient documentation

## 2018-04-25 DIAGNOSIS — E43 Unspecified severe protein-calorie malnutrition: Secondary | ICD-10-CM | POA: Insufficient documentation

## 2018-04-25 DIAGNOSIS — J42 Unspecified chronic bronchitis: Secondary | ICD-10-CM | POA: Insufficient documentation

## 2018-04-25 DIAGNOSIS — Z87891 Personal history of nicotine dependence: Secondary | ICD-10-CM | POA: Insufficient documentation

## 2018-04-25 DIAGNOSIS — J449 Chronic obstructive pulmonary disease, unspecified: Secondary | ICD-10-CM | POA: Diagnosis not present

## 2018-04-25 MED ORDER — PREDNISONE 10 MG PO TABS: 10.0000 mg | ORAL_TABLET | Freq: Every day | ORAL | 6 refills | Status: DC

## 2018-04-25 MED FILL — predniSONE 10 MG TABS: 10 | 30 days supply | Qty: 30 | Fill #0 | Status: TO

## 2018-04-25 MED FILL — IPRATROPIUM BR 0.02% SOLN: 0.02 | 12 days supply | Qty: 5 | Fill #4 | Status: TO
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
## 2018-04-25 NOTE — Patient Instructions (Addendum)
REsume prednisone  and take daily.  Plan long term treatment Stay on Dulera twice daily Stay on nebulizer 4 times daily Hold off on pulmonary rehab Will continue to try to obtain  overnight oxygen test Continue to check peak flow rates twice daily and record Social work and Research scientist (physical sciences) will visit with you today Return 1 month

## 2018-04-25 NOTE — Assessment & Plan Note (Signed)
Severe depression Unstable home situation Close to homeless status Plan  CSW consult along with legal aid counselor

## 2018-04-25 NOTE — Assessment & Plan Note (Signed)
Gold D Copd with asthmatic component Unstable home situation with tobacco and mold exposure PFR falling Plan Resume prednisone  daily and stay Continue Dulera two puff bid  Continue ipratroprium neb med qid Prn SABA No oxygen indicated Pt failed to receive ONO device, now has moved to new location Not able to f/u on pulm rehab: will hold off for now

## 2018-04-25 NOTE — Assessment & Plan Note (Signed)
Encouraged to use nutrition supplement

## 2018-04-25 NOTE — Telephone Encounter (Signed)
Message received from [facility] with [employer]  # [phone] stating that the overnight oximeter was delivered to the patient at her daughter's apartment in [place].  Kim noted that the delivery address was confirmed with the patient's daughter, [name], prior to  delivery. Kim noted that she spoke to [place] who is going to check with the apartment office to see if the machine is in the office and if it isn't, [employer] can deliver another machine.  Update provided to Dr Delford Field.  He noted that the the overnight pulse ox can be put on hold until the patient has more stable housing. Today she mentioned that she may be homeless, going to [facility].  She may not be staying long at her current residence in [place].    Call placed to [facility] who stated that she spoke to the patient's daughter, [name], this morning and she Selena Batten) is waiting to hear back from [place]. If the machine was not received, Selena Batten stated that they would send out another machine.  This CM requested that Kim call this CM back prior to re-sending the machine to confirm that the patient will be receiving it as she is no living in [place] at this time.

## 2018-04-25 NOTE — Assessment & Plan Note (Addendum)
Still has not had ONO, will try again with [employer] Daytime sats resting/exertion >88%

## 2018-04-25 NOTE — BH Specialist Note (Signed)
Integrated Behavioral Health Follow Up Visit  MRN: [id number] Name: Elizabeth Villarreal  Number of [place] Clinician visits: 3/6 Session Start time: 10:00 AM  Session End time: 10:30 AM Total time: 30 minutes  Type of Service: [place] Interpretor:No. Interpretor Name and Language: N/A  SUBJECTIVE: Elizabeth Villarreal is a 47 y.o. female accompanied by self Patient was referred by Dr. Joya Gaskins for depression and anxiety. Patient reports the following symptoms/concerns: overwhelming feelings of sadness and worry, difficulty sleeping, low energy, feeling bad about self, decreased concentration, irritability, and hx of suicidal ideations Duration of problem: Ongoing; Severity of problem: severe  OBJECTIVE: Mood: Anxious and Depressed and Affect: Tearful Risk of harm to self or others: No plan to harm self or others  LIFE CONTEXT: Family and Social: Pt has returned to residing with son's father. She is receiving support from daughter's boyfriend, who is assisting with transportation School/Work: Pt is unemployed. She was recently approved for medicaid. Self-Care: Pt smokes cigarettes. She is considering medication management to assist with managing mental health Life Changes: Pt has ongoing medical conditions and is currently grieving the loss of loved ones. She was kicked out of adult daughter's residence and had to return to ex's residence where she is concerned about being evicted. Pt is currently working with Legal Aid to provide additional support and assistance with housing.  GOALS ADDRESSED: Patient will: 1.  Reduce symptoms of: anxiety, depression and stress  2.  Increase knowledge and/or ability of: coping skills and stress reduction  3.  Demonstrate ability to: Increase healthy adjustment to current life circumstances and Increase adequate support systems for patient/family  INTERVENTIONS: Interventions utilized:  Behavioral Activation  and Supportive Counseling Standardized Assessments completed: GAD-7 and PHQ 2&9 with C-SSRS  ASSESSMENT: Patient currently experiencing depression and anxiety triggered by ongoing medical conditions, grief, and impending eviction from ex's residence. She reports overwhelming feelings of sadness and worry, difficulty sleeping, low energy, feeling bad about self, decreased concentration, irritability, and hx of suicidal ideations. Pt currently denies SI/HI/AVH.   Patient may benefit from psychotherapy and medication management. She is considering medication management through PCP and will discuss options at next scheduled visit. Legal Aid representative, Abelino Derrick met with pt to follow up with housing concerns. [name] will assist pt with obtaining a [employer] phone and will be referred to housing specialist through Legal Aid. An [employer] medical exemption form was completed and signed by Dr. Joya Gaskins. Pt agreed to take form to DSS to assist with obtaining SNAP benefits.  PLAN: 1. Follow up with behavioral health clinician on : Pt was encouraged tocontact LCSWA if symptoms worsen or fail to improveto schedule behavioral appointments at [facility]. 2. Behavioral recommendations: LCSWA recommends that pt apply healthy coping skills discussed, follow up with Legal Aid, and utilize provided resources. Pt is encouraged to schedule follow up appointment with LCSWA 3. Referral(s): Armed forces logistics/support/administrative officer (LME/Outside Clinic) and [employer]:  Food and Housing 4. "From scale of 1-10, how likely are you to follow plan?": 10/10  Rebekah Chesterfield, LCSW 04/27/18 9:15 AM

## 2018-04-25 NOTE — Assessment & Plan Note (Signed)
Asthmatic component Now with mold exposure PFR lower 120-130 since move to boyfriend's home Plan Start Prednisone  daily and stay

## 2018-04-25 NOTE — Telephone Encounter (Signed)
Call received from Selena Batten with [employer] who stated that she had a message from the patient's daughter, [name] stating that they " have the machine and will do the test."  Selena Batten stated that written instructions are included with the machine regarding how to perform the test.

## 2018-04-26 MED FILL — DULERA 100 MCG/5 MCG INH: 100-5 | 30 days supply | Qty: 13 | Fill #0

## 2018-05-17 ENCOUNTER — Other Ambulatory Visit (HOSPITAL_COMMUNITY)
Admission: RE | Admit: 2018-05-17 | Discharge: 2018-05-17 | Disposition: A | Discharge status: Home / Self Care | Payer: Medicaid Other | Source: Ambulatory Visit | Attending: Internal Medicine | Admitting: Internal Medicine

## 2018-05-17 ENCOUNTER — Ambulatory Visit: Payer: Medicaid Other | Attending: Internal Medicine | Admitting: Licensed Clinical Social Worker

## 2018-05-17 ENCOUNTER — Ambulatory Visit: Payer: Medicaid Other | Attending: Internal Medicine | Admitting: Internal Medicine

## 2018-05-17 ENCOUNTER — Encounter: Payer: Self-pay | Admitting: Internal Medicine

## 2018-05-17 VITALS — BP 113/68 | HR 85 | Temp 98.2°F | Resp 16 | Wt 120.6 lb

## 2018-05-17 DIAGNOSIS — F419 Anxiety disorder, unspecified: Secondary | ICD-10-CM

## 2018-05-17 DIAGNOSIS — Z87891 Personal history of nicotine dependence: Secondary | ICD-10-CM | POA: Diagnosis not present

## 2018-05-17 DIAGNOSIS — J449 Chronic obstructive pulmonary disease, unspecified: Secondary | ICD-10-CM | POA: Diagnosis not present

## 2018-05-17 DIAGNOSIS — Z124 Encounter for screening for malignant neoplasm of cervix: Secondary | ICD-10-CM | POA: Insufficient documentation

## 2018-05-17 DIAGNOSIS — Z8049 Family history of malignant neoplasm of other genital organs: Secondary | ICD-10-CM | POA: Insufficient documentation

## 2018-05-17 DIAGNOSIS — Z9189 Other specified personal risk factors, not elsewhere classified: Secondary | ICD-10-CM | POA: Diagnosis not present

## 2018-05-17 DIAGNOSIS — Z1239 Encounter for other screening for malignant neoplasm of breast: Secondary | ICD-10-CM

## 2018-05-17 DIAGNOSIS — N920 Excessive and frequent menstruation with regular cycle: Secondary | ICD-10-CM | POA: Diagnosis not present

## 2018-05-17 DIAGNOSIS — L729 Follicular cyst of the skin and subcutaneous tissue, unspecified: Secondary | ICD-10-CM

## 2018-05-17 DIAGNOSIS — Z885 Allergy status to narcotic agent status: Secondary | ICD-10-CM | POA: Diagnosis not present

## 2018-05-17 DIAGNOSIS — Z720 Tobacco use: Secondary | ICD-10-CM | POA: Insufficient documentation

## 2018-05-17 DIAGNOSIS — Z79899 Other long term (current) drug therapy: Secondary | ICD-10-CM | POA: Insufficient documentation

## 2018-05-17 DIAGNOSIS — Z23 Encounter for immunization: Secondary | ICD-10-CM

## 2018-05-17 DIAGNOSIS — Z7951 Long term (current) use of inhaled steroids: Secondary | ICD-10-CM | POA: Diagnosis not present

## 2018-05-17 DIAGNOSIS — F329 Major depressive disorder, single episode, unspecified: Secondary | ICD-10-CM | POA: Diagnosis not present

## 2018-05-17 DIAGNOSIS — F331 Major depressive disorder, recurrent, moderate: Secondary | ICD-10-CM

## 2018-05-17 DIAGNOSIS — Z825 Family history of asthma and other chronic lower respiratory diseases: Secondary | ICD-10-CM | POA: Diagnosis not present

## 2018-05-17 DIAGNOSIS — Z801 Family history of malignant neoplasm of trachea, bronchus and lung: Secondary | ICD-10-CM | POA: Insufficient documentation

## 2018-05-17 DIAGNOSIS — N898 Other specified noninflammatory disorders of vagina: Secondary | ICD-10-CM | POA: Diagnosis not present

## 2018-05-17 DIAGNOSIS — Z1231 Encounter for screening mammogram for malignant neoplasm of breast: Secondary | ICD-10-CM

## 2018-05-17 DIAGNOSIS — Z888 Allergy status to other drugs, medicaments and biological substances status: Secondary | ICD-10-CM | POA: Diagnosis not present

## 2018-05-17 DIAGNOSIS — D649 Anemia, unspecified: Secondary | ICD-10-CM | POA: Diagnosis not present

## 2018-05-17 NOTE — Patient Instructions (Signed)
Watch the cyst in Right groin area.  Let me know if it increases in size.
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
## 2018-05-18 LAB — CBC
HEMOGLOBIN: 12.9 g/dL (ref 11.1–15.9)
Hematocrit: 38.3 % (ref 34.0–46.6)
MCH: 30.1 pg (ref 26.6–33.0)
MCHC: 33.7 g/dL (ref 31.5–35.7)
MCV: 89 fL (ref 79–97)
Platelets: 330 10*3/uL (ref 150–450)
RBC: 4.29 x10E6/uL (ref 3.77–5.28)
RDW: 15.7 % — ABNORMAL HIGH (ref 12.3–15.4)
WBC: 5.1 10*3/uL (ref 3.4–10.8)

## 2018-05-18 LAB — CYTOLOGY - PAP
Chlamydia: NEGATIVE
Diagnosis: NEGATIVE
HPV (WINDOPATH): NOT DETECTED
Neisseria Gonorrhea: NEGATIVE
TRICH (WINDOWPATH): NEGATIVE

## 2018-05-18 LAB — IRON,TIBC AND FERRITIN PANEL
FERRITIN: 12 ng/mL — AB (ref 15–150)
IRON SATURATION: 11 % — AB (ref 15–55)
Iron: 44 ug/dL (ref 27–159)
Total Iron Binding Capacity: 389 ug/dL (ref 250–450)
UIBC: 345 ug/dL (ref 131–425)

## 2018-05-23 ENCOUNTER — Telehealth: Payer: Self-pay | Admitting: *Deleted

## 2018-05-23 ENCOUNTER — Ambulatory Visit: Payer: Medicaid Other | Attending: Critical Care Medicine | Admitting: Critical Care Medicine

## 2018-05-23 ENCOUNTER — Encounter: Payer: Self-pay | Admitting: Critical Care Medicine

## 2018-05-23 VITALS — BP 103/72 | HR 110 | Temp 98.0°F | Resp 16 | Wt 119.8 lb

## 2018-05-23 DIAGNOSIS — Z79899 Other long term (current) drug therapy: Secondary | ICD-10-CM | POA: Insufficient documentation

## 2018-05-23 DIAGNOSIS — B432 Subcutaneous pheomycotic abscess and cyst: Secondary | ICD-10-CM | POA: Insufficient documentation

## 2018-05-23 DIAGNOSIS — J449 Chronic obstructive pulmonary disease, unspecified: Secondary | ICD-10-CM | POA: Diagnosis not present

## 2018-05-23 DIAGNOSIS — Z8701 Personal history of pneumonia (recurrent): Secondary | ICD-10-CM | POA: Insufficient documentation

## 2018-05-23 DIAGNOSIS — L729 Follicular cyst of the skin and subcutaneous tissue, unspecified: Secondary | ICD-10-CM

## 2018-05-23 DIAGNOSIS — J4489 Other specified chronic obstructive pulmonary disease: Secondary | ICD-10-CM

## 2018-05-23 DIAGNOSIS — Z23 Encounter for immunization: Secondary | ICD-10-CM

## 2018-05-23 DIAGNOSIS — Z87891 Personal history of nicotine dependence: Secondary | ICD-10-CM

## 2018-05-23 DIAGNOSIS — F329 Major depressive disorder, single episode, unspecified: Secondary | ICD-10-CM | POA: Diagnosis not present

## 2018-05-23 MED FILL — predniSONE 10 MG TABS: 10 | 30 days supply | Qty: 30 | Fill #1 | Status: TO

## 2018-05-23 MED FILL — DULERA 100 MCG/5 MCG INH: 100-5 | 30 days supply | Qty: 13 | Fill #1 | Status: TO

## 2018-05-23 NOTE — Progress Notes (Signed)
Peak flow has been low from home  Muscle spasms in stomach and ribs. Increased SOB last few days

## 2018-05-23 NOTE — Patient Instructions (Signed)
Referral to [facility] Darnell Level[facility] will be made Use albuterol as needed Stay on ipratroprium and dulera as prescribed No change in prednisone dose Return August 14

## 2018-05-23 NOTE — Assessment & Plan Note (Signed)
Continue smoking cessation  

## 2018-05-23 NOTE — Telephone Encounter (Signed)
Pt was concerned if tenderness and aching on the right side of her buttock and thigh was related to cyst finding at the last visit. Aching x 1 week, taking ibuprofen. Instructed to try hot/cold compress. This concerns was also address in the OV today with Dr. Delford FieldWright.
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
## 2018-05-23 NOTE — Assessment & Plan Note (Signed)
COPD with asthma component. Plan  use albuterol prn only Stay on ipratroprium qid by neb Cont dulera bid Cont prednisone 10mg  daily Pt to avoid smoking (is receiving passive smoke at home)

## 2018-05-23 NOTE — Assessment & Plan Note (Signed)
Improved overall.

## 2018-05-23 NOTE — Assessment & Plan Note (Signed)
Subcutaneous cyst. Pt states is an issue, sits right at underwear line. Plan  Referral to Dr Darnell Levelodd Gerkin [facility] for consideration of removal

## 2018-06-26 ENCOUNTER — Encounter (HOSPITAL_COMMUNITY): Payer: Self-pay | Admitting: *Deleted

## 2018-06-26 NOTE — Progress Notes (Signed)
[name]. She has not returned any of my calls. I will no longer attempt to reach her.

## 2018-07-11 ENCOUNTER — Ambulatory Visit: Payer: Medicaid Other | Admitting: Critical Care Medicine

## 2018-08-02 ENCOUNTER — Other Ambulatory Visit: Payer: Self-pay | Admitting: Internal Medicine

## 2018-08-02 DIAGNOSIS — Z1231 Encounter for screening mammogram for malignant neoplasm of breast: Secondary | ICD-10-CM

## 2018-08-15 ENCOUNTER — Telehealth: Payer: Self-pay

## 2018-08-15 ENCOUNTER — Encounter: Payer: Self-pay | Admitting: Critical Care Medicine

## 2018-08-15 ENCOUNTER — Ambulatory Visit: Payer: Medicaid Other | Attending: Critical Care Medicine | Admitting: Critical Care Medicine

## 2018-08-15 VITALS — BP 132/80 | HR 71 | Temp 98.5°F | Resp 16 | Wt 132.2 lb

## 2018-08-15 DIAGNOSIS — J449 Chronic obstructive pulmonary disease, unspecified: Secondary | ICD-10-CM | POA: Diagnosis present

## 2018-08-15 DIAGNOSIS — Z23 Encounter for immunization: Secondary | ICD-10-CM | POA: Diagnosis not present

## 2018-08-15 DIAGNOSIS — Z87891 Personal history of nicotine dependence: Secondary | ICD-10-CM | POA: Diagnosis not present

## 2018-08-15 DIAGNOSIS — J42 Unspecified chronic bronchitis: Secondary | ICD-10-CM | POA: Diagnosis not present

## 2018-08-15 DIAGNOSIS — F329 Major depressive disorder, single episode, unspecified: Secondary | ICD-10-CM | POA: Insufficient documentation

## 2018-08-15 DIAGNOSIS — J441 Chronic obstructive pulmonary disease with (acute) exacerbation: Secondary | ICD-10-CM | POA: Insufficient documentation

## 2018-08-15 DIAGNOSIS — J440 Chronic obstructive pulmonary disease with acute lower respiratory infection: Secondary | ICD-10-CM | POA: Insufficient documentation

## 2018-08-15 DIAGNOSIS — Z79899 Other long term (current) drug therapy: Secondary | ICD-10-CM | POA: Diagnosis not present

## 2018-08-15 MED ORDER — PREDNISONE 10 MG PO TABS: ORAL_TABLET | ORAL | 6 refills | Status: DC

## 2018-08-15 NOTE — Patient Instructions (Addendum)
Increase prednisone to 2 daily for 4 days then one daily and stay No antibiotics needed We will obtain records from [facility] on breathing test and labs No other medication changes Refills on all meds in our pharmacy A flu shot will be given Return 3 months   Influenza Virus Vaccine injection (Fluarix) What is this medicine? INFLUENZA VIRUS VACCINE (in floo EN zuh VAHY ruhs vak SEEN) helps to reduce the risk of getting influenza also known as the flu. This medicine may be used for other purposes; ask your health care provider or pharmacist if you have questions. COMMON BRAND NAME(S): Fluarix, Fluzone What should I tell my health care provider before I take this medicine? They need to know if you have any of these conditions: -bleeding disorder like hemophilia -fever or infection -Guillain-Barre syndrome or other neurological problems -immune system problems -infection with the human immunodeficiency virus (HIV) or AIDS -low blood platelet counts -multiple sclerosis -an unusual or allergic reaction to influenza virus vaccine, eggs, chicken proteins, latex, gentamicin, other medicines, foods, dyes or preservatives -pregnant or trying to get pregnant -breast-feeding How should I use this medicine? This vaccine is for injection into a muscle. It is given by a health care professional. A copy of Vaccine Information Statements will be given before each vaccination. Read this sheet carefully each time. The sheet may change frequently. Talk to your pediatrician regarding the use of this medicine in children. Special care may be needed. Overdosage: If you think you have taken too much of this medicine contact a poison control center or emergency room at once. NOTE: This medicine is only for you. Do not share this medicine with others. What if I miss a dose? This does not apply. What may interact with this medicine? -chemotherapy or radiation therapy -medicines that lower  your immune system like etanercept, anakinra, infliximab, and adalimumab -medicines that treat or prevent blood clots like warfarin -phenytoin -steroid medicines like prednisone or cortisone -theophylline -vaccines This list may not describe all possible interactions. Give your health care provider a list of all the medicines, herbs, non-prescription drugs, or dietary supplements you use. Also tell them if you smoke, drink alcohol, or use illegal drugs. Some items may interact with your medicine. What should I watch for while using this medicine? Report any side effects that do not go away within 3 days to your doctor or health care professional. Call your health care provider if any unusual symptoms occur within 6 weeks of receiving this vaccine. You may still catch the flu, but the illness is not usually as bad. You cannot get the flu from the vaccine. The vaccine will not protect against colds or other illnesses that may cause fever. The vaccine is needed every year. What side effects may I notice from receiving this medicine? Side effects that you should report to your doctor or health care professional as soon as possible: -allergic reactions like skin rash, itching or hives, swelling of the face, lips, or tongue Side effects that usually do not require medical attention (report to your doctor or health care professional if they continue or are bothersome): -fever -headache -muscle aches and pains -pain, tenderness, redness, or swelling at site where injected -weak or tired This list may not describe all possible side effects. Call your doctor for medical advice about side effects. You may report side effects to FDA at 1-800-FDA-1088. Where should I keep my medicine? This vaccine is only given in a clinic, pharmacy, doctor's office, or  other health care setting and will not be stored at home. NOTE: This sheet is a summary. It may not cover all possible information. If you have questions  about this medicine, talk to your doctor, pharmacist, or health care provider.  2018 Elsevier/Gold Standard (2008-06-18 09:30:40)
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
## 2018-08-15 NOTE — Assessment & Plan Note (Addendum)
Copd Gold D  Not currently infected but mild exacerbation Plan Mild pulse prednisone, rx sent, stay on 10mg  daily thereafter Cont inhaled meds Cont prn SABA F/u on ONO RA.  ?needs home O2.  Had this from [employer], was not turned in and data lost, Flu vaccine

## 2018-08-15 NOTE — Telephone Encounter (Signed)
Call placed to [employer] # [phone] to inquire about the ONO report. Spoke to [place] who stated that the machine was sent back on 07/10/18  and there was no information on it.  She noted that another request for ONO can be faxed to # [phone].  This CM spoke to the patient and she said that she did complete the procedure and sent the machine back. The patient's address was confirmed in Epic. She said that she does not know the phone # where she is staying and will call this office with the number. In the meantime, she said that she could be contacted through her daughter   Call placed to [employer] again.  Spoke to [employer] who explained that the machine was out for a couple of months and the battery was dead when they received it.  When the battery is dead, the data obtained from the study was gone. Selena Batten said that the machine as shipped 04/17/18 and returned on 07/10/18.  She said that they had tried to contact the patient's daughter a couple of times to check the status of the machine and eventually sent her a bill on 06/05/18. She said that they would need an order for another ONO if the provider desires.  Update provided to Dr Delford Field.

## 2018-08-17 ENCOUNTER — Telehealth: Payer: Self-pay

## 2018-08-17 NOTE — Telephone Encounter (Signed)
Referral for ONO faxed to [facility]  - fax # [phone]

## 2018-08-20 ENCOUNTER — Ambulatory Visit: Payer: Medicaid Other | Admitting: Internal Medicine

## 2018-08-20 ENCOUNTER — Telehealth: Payer: Self-pay

## 2018-08-20 NOTE — Telephone Encounter (Signed)
Call placed to [employer] # [phone], spoke to BaggsElizabeth.  After checking with Selena BattenKim, she said that they would go ahead and set up the patient again for the ONO.  The referral was received.

## 2018-10-19 ENCOUNTER — Telehealth: Payer: Self-pay | Admitting: Critical Care Medicine

## 2018-10-19 NOTE — Telephone Encounter (Signed)
I attempted to call this patient and there is no active phone numbers in the system  We need to contact the patient and let her know her overnight oximetry test was normal and she does not need any oxygen currently at night or during the day  Elizabeth LevansPatrick Wright

## 2018-10-23 NOTE — Telephone Encounter (Addendum)
Left message on voicemail. Used contact numbers from demographics section.Will mail letter for patient to contact office.

## 2018-10-25 ENCOUNTER — Telehealth: Payer: Self-pay | Admitting: Internal Medicine

## 2018-10-25 NOTE — Telephone Encounter (Signed)
1) Medication(s) Requested (by name): Ipratropium prednisone 2) Pharmacy of Choice: [facility] [address] [place]

## 2018-10-25 NOTE — Telephone Encounter (Signed)
Patient returned called regarding her results. Contact information has been updated for the patient. Please follow up

## 2018-10-26 NOTE — Telephone Encounter (Signed)
According to [facility] pharmacist they refilled her prednisone and it is ready for pick up. The nebulizer solution was picked up four days ago.  Patient was notified.

## 2018-10-26 NOTE — Telephone Encounter (Signed)
Left message on voicemail.

## 2018-10-31 NOTE — Telephone Encounter (Signed)
Attempt to leave a message on voicemail to return call. Letter was mailed to patient 10/23/2018. Unable to reach patient.

## 2018-11-13 ENCOUNTER — Telehealth: Payer: Self-pay | Admitting: Internal Medicine

## 2018-11-13 NOTE — Telephone Encounter (Signed)
Will forward to Child psychotherapistsocial worker for a follow up call

## 2018-11-13 NOTE — Telephone Encounter (Signed)
PHQ9 scored moderate 11/12/18 per Nurse Case mgr Evie Lacksebbie Handy with [facility]. Pt is [place] enrollee. Eunice BlaseDebbie can be reached directly at [phone]. Debbie gave contact info for [facility] to pt as well. Suicide screening negative per nurse cases manager.

## 2018-11-14 NOTE — Telephone Encounter (Signed)
Call placed to patient. LCSWA reminded pt of her scheduled appointment with PCP tomorrow, where LCSWA will follow up on behavioral health.   Pt shared that she does not have transportation and Medicaid requires a five day notice. LCSWA consulted with Manufacturing engineerractice Front Manager, who agreed to provide transportation for this one time. Pt was appreciative for the assistance.

## 2018-11-15 ENCOUNTER — Other Ambulatory Visit: Payer: Self-pay

## 2018-11-15 ENCOUNTER — Encounter: Payer: Self-pay | Admitting: Internal Medicine

## 2018-11-15 ENCOUNTER — Ambulatory Visit: Payer: Medicaid Other | Attending: Internal Medicine | Admitting: Internal Medicine

## 2018-11-15 ENCOUNTER — Ambulatory Visit (HOSPITAL_BASED_OUTPATIENT_CLINIC_OR_DEPARTMENT_OTHER): Payer: Medicaid Other | Admitting: Licensed Clinical Social Worker

## 2018-11-15 VITALS — BP 115/73 | HR 82 | Temp 97.9°F | Resp 16 | Wt 131.0 lb

## 2018-11-15 DIAGNOSIS — N898 Other specified noninflammatory disorders of vagina: Secondary | ICD-10-CM | POA: Diagnosis not present

## 2018-11-15 DIAGNOSIS — Z599 Problem related to housing and economic circumstances, unspecified: Secondary | ICD-10-CM

## 2018-11-15 DIAGNOSIS — R0789 Other chest pain: Secondary | ICD-10-CM | POA: Diagnosis not present

## 2018-11-15 DIAGNOSIS — Z87891 Personal history of nicotine dependence: Secondary | ICD-10-CM | POA: Insufficient documentation

## 2018-11-15 DIAGNOSIS — J449 Chronic obstructive pulmonary disease, unspecified: Secondary | ICD-10-CM | POA: Diagnosis present

## 2018-11-15 DIAGNOSIS — R079 Chest pain, unspecified: Secondary | ICD-10-CM | POA: Diagnosis not present

## 2018-11-15 DIAGNOSIS — Z79899 Other long term (current) drug therapy: Secondary | ICD-10-CM | POA: Insufficient documentation

## 2018-11-15 DIAGNOSIS — F329 Major depressive disorder, single episode, unspecified: Secondary | ICD-10-CM | POA: Diagnosis not present

## 2018-11-15 DIAGNOSIS — F32A Depression, unspecified: Secondary | ICD-10-CM

## 2018-11-15 DIAGNOSIS — G629 Polyneuropathy, unspecified: Secondary | ICD-10-CM | POA: Diagnosis not present

## 2018-11-15 DIAGNOSIS — D509 Iron deficiency anemia, unspecified: Secondary | ICD-10-CM | POA: Diagnosis not present

## 2018-11-15 DIAGNOSIS — F419 Anxiety disorder, unspecified: Secondary | ICD-10-CM

## 2018-11-15 DIAGNOSIS — G5793 Unspecified mononeuropathy of bilateral lower limbs: Secondary | ICD-10-CM

## 2018-11-15 DIAGNOSIS — F331 Major depressive disorder, recurrent, moderate: Secondary | ICD-10-CM

## 2018-11-15 DIAGNOSIS — F320 Major depressive disorder, single episode, mild: Secondary | ICD-10-CM

## 2018-11-15 DIAGNOSIS — G4700 Insomnia, unspecified: Secondary | ICD-10-CM | POA: Diagnosis not present

## 2018-11-15 NOTE — BH Specialist Note (Signed)
Integrated Behavioral Health Follow Up Visit  MRN: [id number] Name: Elizabeth Villarreal  Number of Integrated Behavioral Health Clinician visits: 1/6 Session Start time: 2:05 PM  Session End time: 2:20 PM Total time: 15 minutes  Type of Service: Integrated Behavioral Health- Individual/Family Interpretor:No. Interpretor Name and Language: N/A  SUBJECTIVE: Elizabeth AsaMary Faith is a 47 y.o. female accompanied by self Patient was referred by Dr. Laural BenesJohnson for follow up. Patient reports the following symptoms/concerns: Pt has difficulty managing ongoing medical and mental health conditions. She reports barriers to treatment as transportation Duration of problem: Ongoing; Severity of problem: moderate  OBJECTIVE: Mood: Anxious and Affect: Appropriate Risk of harm to self or others: No plan to harm self or others  LIFE CONTEXT: Family and Social: Pt has limited support from adult daughters and the father to minor child School/Work: Pt recently was awarded disability Self-Care: Pt has hx of smoking cigarettes. She attempts to prioritize needs to prevent feelings of  overwhelm Life Changes: Pt has difficulty managing mental and physical health needs.   GOALS ADDRESSED: Patient will: 1.  Reduce symptoms of: anxiety, depression and stress  2.  Increase knowledge and/or ability of: self-management skills  3.  Demonstrate ability to: Increase adequate support systems for patient/family  INTERVENTIONS: Interventions utilized:  Solution-Focused Strategies, Supportive Counseling and Link to [facility] Standardized Assessments completed: GAD-7 and PHQ 2&9  ASSESSMENT: Patient currently experiencing depression and anxiety triggered by difficulty managing ongoing medical and mental health conditions. She reports barriers to treatment as transportation and unstable housing. Pt receives limited support from family.   Patient shared that she was recently approved for disability. She is interested in obtaining  independent housing and possibly purchasing a new vehicle to assist with transportation. LCSWA provided pt with supportive resources to assist with obtaining safe and affordable housing. Pt will utilize Medicaid transportation for all medical appointments.   PLAN: 1. Follow up with behavioral health clinician on : Pt was encouraged to contact LCSWA if symptoms worsen or fail to improve to [facility]. 2. Behavioral recommendations: LCSWA recommends that pt apply healthy coping skills discussed and utilize provided resources. Pt is encouraged to schedule follow up appointment with LCSWA 3. Referral(s): Integrated Art gallery managerBehavioral Health Services (In Clinic) and [facility]:  Housing and Transportation 4. "From scale of 1-10, how likely are you to follow plan?":   Bridgett LarssonJasmine D , LCSW 11/19/18 4:40 PM
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
## 2018-11-15 NOTE — Patient Instructions (Signed)
You should take the iron supplement 3 times a week.  Purchase vitamin D 400 IU over-the-counter and take 1 daily.  Purchase calcium supplement 600 mg and take 1 daily.  You have been referred to a cardiologist for evaluation of chest pain.

## 2018-11-16 LAB — TSH: TSH: 2.46 u[IU]/mL (ref 0.450–4.500)

## 2018-11-16 LAB — HEMOGLOBIN A1C
Est. average glucose Bld gHb Est-mCnc: 117 mg/dL
Hgb A1c MFr Bld: 5.7 % — ABNORMAL HIGH (ref 4.8–5.6)

## 2018-11-19 ENCOUNTER — Telehealth: Payer: Self-pay | Admitting: Licensed Clinical Social Worker

## 2018-11-19 NOTE — Telephone Encounter (Signed)
Call placed to Ms. Evie Lacksebbie Handy, RN with [facility]. LCSWA informed Ms. Handy that she was following up with information regarding pt's recent PHQ-9 score.   Ms. Carola FrostHandy stated that pt scored moderate and agreed to goals, which included, obtaining reliable transportation to medical appointments. Pt successfully registered for [employer] and was provided contact information for [facility] to establish behavioral health services. No additional concerns noted.

## 2018-12-03 ENCOUNTER — Other Ambulatory Visit: Payer: Self-pay

## 2018-12-03 MED ORDER — IPRATROPIUM BROMIDE 0.02 % IN SOLN: 0.5000 mg | Freq: Four times a day (QID) | RESPIRATORY_TRACT | 2 refills | Status: DC

## 2018-12-03 MED ORDER — ALBUTEROL SULFATE (2.5 MG/3ML) 0.083% IN NEBU: 2.5000 mg | INHALATION_SOLUTION | Freq: Four times a day (QID) | RESPIRATORY_TRACT | 2 refills | Status: DC | PRN

## 2018-12-13 ENCOUNTER — Ambulatory Visit: Payer: Medicaid Other | Admitting: Cardiology

## 2018-12-27 ENCOUNTER — Telehealth: Payer: Self-pay | Admitting: Internal Medicine

## 2018-12-27 NOTE — Telephone Encounter (Signed)
Patient would like to get an update regarding her medication because she says that the quantity is lower than what she normally gets causing her to make more trips to the pharmacy. Please follow up  [id number] and albuterol

## 2018-12-28 NOTE — Telephone Encounter (Signed)
Will forward to pcp

## 2019-01-29 ENCOUNTER — Ambulatory Visit: Payer: Medicaid Other | Admitting: Cardiology

## 2019-02-07 ENCOUNTER — Other Ambulatory Visit: Payer: Self-pay | Admitting: Internal Medicine

## 2019-02-10 ENCOUNTER — Other Ambulatory Visit: Payer: Self-pay | Admitting: Internal Medicine

## 2019-02-14 ENCOUNTER — Other Ambulatory Visit: Payer: Self-pay | Admitting: Internal Medicine

## 2019-02-15 NOTE — Telephone Encounter (Signed)
Patient clarity on quantity.

## 2019-02-18 ENCOUNTER — Other Ambulatory Visit: Payer: Self-pay | Admitting: Internal Medicine

## 2019-02-18 DIAGNOSIS — J449 Chronic obstructive pulmonary disease, unspecified: Secondary | ICD-10-CM

## 2019-02-18 NOTE — Telephone Encounter (Signed)
Patient called and asked to speak with a nurse about the prescription being written wrong.

## 2019-02-19 NOTE — Telephone Encounter (Signed)
Will forward to [employer]

## 2019-02-20 ENCOUNTER — Encounter: Payer: Self-pay | Admitting: Internal Medicine

## 2019-02-20 DIAGNOSIS — J449 Chronic obstructive pulmonary disease, unspecified: Secondary | ICD-10-CM

## 2019-02-20 MED ORDER — IPRATROPIUM BROMIDE 0.02 % IN SOLN: RESPIRATORY_TRACT | 1 refills | Status: DC

## 2019-02-20 NOTE — Telephone Encounter (Signed)
Spoke with the patients pharmacy who confirmed the patient has received prescriptions for albuterol and ipratropium nebs and her Dulera. She received three boxes of albuterol(roughly 75 nebs) on 02/08/19 and 2 boxes of ipratropium(roughly 50 nebs) on 02/09/19. I left a vm for her to call me back, this should be more than enough if she is using them prn.  Dr. Laural Benes: the ipratropium does not explicitly say to use PRN, is she supposed to be using it four times daily on a scheduled basis?

## 2019-02-20 NOTE — Telephone Encounter (Signed)
Marcine Matar, MD  You 7 minutes ago (1:05 PM)    According to Dr. Lynelle Doctor note of 05/23/2018, she is to do Ipratropium neb QID.   Routing comment    If the patient has been instructed to use the ipratropium QID she will need a quantity of for a one month supply. (1 Box=23mLs=25 nebs)  Please authorize the appropriate number of refills given that is a one month supply.

## 2019-02-21 MED ORDER — ALBUTEROL SULFATE (2.5 MG/3ML) 0.083% IN NEBU: INHALATION_SOLUTION | RESPIRATORY_TRACT | 1 refills | Status: DC

## 2019-03-18 ENCOUNTER — Ambulatory Visit: Payer: Medicaid Other | Admitting: Internal Medicine

## 2019-03-29 ENCOUNTER — Telehealth: Payer: Self-pay | Admitting: Internal Medicine

## 2019-03-29 DIAGNOSIS — J449 Chronic obstructive pulmonary disease, unspecified: Secondary | ICD-10-CM

## 2019-03-29 NOTE — Telephone Encounter (Signed)
1) Medication(s) Requested (by name): albuterol (PROVENTIL) (2.5 MG/3ML) 0.083% nebulizer solution [[id number]]  ipratropium (ATROVENT) 0.02 % nebulizer solution [[id number]]   2) Pharmacy of Choice:  3) Special Requests: Patient would like three month supply Approved medications will be sent to the pharmacy, we will reach out if there is an issue.  Requests made after 3pm may not be addressed until the following business day!  If a patient is unsure of the name of the medication(s) please note and ask patient to call back when they are able to provide all info, do not send to responsible party until all information is available!

## 2019-04-04 MED ORDER — ALBUTEROL SULFATE (2.5 MG/3ML) 0.083% IN NEBU: INHALATION_SOLUTION | RESPIRATORY_TRACT | 2 refills | Status: DC

## 2019-04-04 MED ORDER — IPRATROPIUM BROMIDE 0.02 % IN SOLN: RESPIRATORY_TRACT | 2 refills | Status: DC

## 2019-04-05 ENCOUNTER — Telehealth: Payer: Self-pay | Admitting: Internal Medicine

## 2019-04-10 ENCOUNTER — Other Ambulatory Visit: Payer: Self-pay | Admitting: Pharmacist

## 2019-04-10 NOTE — Progress Notes (Signed)
Error

## 2019-06-20 ENCOUNTER — Other Ambulatory Visit: Payer: Self-pay | Admitting: Internal Medicine

## 2019-06-20 ENCOUNTER — Other Ambulatory Visit: Payer: Self-pay | Admitting: Critical Care Medicine

## 2019-06-24 NOTE — Telephone Encounter (Signed)
Please schedule pt an appointment  

## 2019-07-16 ENCOUNTER — Other Ambulatory Visit: Payer: Self-pay | Admitting: Internal Medicine

## 2019-07-16 DIAGNOSIS — J449 Chronic obstructive pulmonary disease, unspecified: Secondary | ICD-10-CM

## 2019-07-22 ENCOUNTER — Ambulatory Visit: Payer: Self-pay | Attending: Internal Medicine | Admitting: Internal Medicine

## 2019-07-22 ENCOUNTER — Encounter: Payer: Self-pay | Admitting: Internal Medicine

## 2019-07-22 ENCOUNTER — Other Ambulatory Visit: Payer: Self-pay

## 2019-07-22 ENCOUNTER — Telehealth: Payer: Self-pay

## 2019-07-22 VITALS — BP 121/84 | HR 76 | Temp 98.5°F | Resp 16 | Ht 68.0 in | Wt 132.4 lb

## 2019-07-22 DIAGNOSIS — R7303 Prediabetes: Secondary | ICD-10-CM

## 2019-07-22 DIAGNOSIS — F32A Depression, unspecified: Secondary | ICD-10-CM

## 2019-07-22 DIAGNOSIS — N898 Other specified noninflammatory disorders of vagina: Secondary | ICD-10-CM

## 2019-07-22 DIAGNOSIS — F320 Major depressive disorder, single episode, mild: Secondary | ICD-10-CM

## 2019-07-22 DIAGNOSIS — Z5900 Homelessness unspecified: Secondary | ICD-10-CM

## 2019-07-22 DIAGNOSIS — J449 Chronic obstructive pulmonary disease, unspecified: Secondary | ICD-10-CM

## 2019-07-22 DIAGNOSIS — G5793 Unspecified mononeuropathy of bilateral lower limbs: Secondary | ICD-10-CM

## 2019-07-22 MED ORDER — IPRATROPIUM BROMIDE 0.02 % IN SOLN: RESPIRATORY_TRACT | 6 refills | Status: DC

## 2019-07-22 MED ORDER — GABAPENTIN 300 MG PO CAPS: 300.0000 mg | ORAL_CAPSULE | Freq: Every day | ORAL | 3 refills | Status: DC

## 2019-07-22 MED ORDER — DULERA 100-5 MCG/ACT IN AERO: 2.0000 | INHALATION_SPRAY | Freq: Two times a day (BID) | RESPIRATORY_TRACT | 6 refills | Status: DC

## 2019-07-22 MED ORDER — ALBUTEROL SULFATE (2.5 MG/3ML) 0.083% IN NEBU: INHALATION_SOLUTION | RESPIRATORY_TRACT | 2 refills | Status: DC

## 2019-07-22 MED ORDER — ALBUTEROL SULFATE HFA 108 (90 BASE) MCG/ACT IN AERS: 1.0000 | INHALATION_SPRAY | Freq: Four times a day (QID) | RESPIRATORY_TRACT | 6 refills | Status: DC | PRN

## 2019-07-22 NOTE — Patient Instructions (Signed)
Please give patient an appointment with Dr. Joya Gaskins for follow-up on COPD.

## 2019-07-22 NOTE — Telephone Encounter (Signed)
Met with patient when she was in the clinic today. She explained that she recently returned to [facility] form [place] and is now homeless , living in her car. She is requesting assistance with housing.  Explained to her that a referral can be made to [facility] and they will provide shelter for her at a motel while COVID testing is done and she will then be transferred to a shelter. She understands that she will need to remain quarantined while she is at [facility].   She confirmed her phone number and a referral was made to [facility].   The patient noted that she has her nebulizer and has the adapter for her car so she is able to use the nebulizer in her car.
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
## 2019-07-23 LAB — CBC WITH DIFFERENTIAL
Basophils Absolute: 0.1 10*3/uL (ref 0.0–0.2)
Basos: 1 %
EOS (ABSOLUTE): 0.2 10*3/uL (ref 0.0–0.4)
Eos: 5 %
Hematocrit: 39.3 % (ref 34.0–46.6)
Hemoglobin: 13.3 g/dL (ref 11.1–15.9)
Immature Grans (Abs): 0 10*3/uL (ref 0.0–0.1)
Immature Granulocytes: 0 %
Lymphocytes Absolute: 1.9 10*3/uL (ref 0.7–3.1)
Lymphs: 40 %
MCH: 30.2 pg (ref 26.6–33.0)
MCHC: 33.8 g/dL (ref 31.5–35.7)
MCV: 89 fL (ref 79–97)
Monocytes Absolute: 0.5 10*3/uL (ref 0.1–0.9)
Monocytes: 10 %
Neutrophils Absolute: 2.2 10*3/uL (ref 1.4–7.0)
Neutrophils: 44 %
RBC: 4.41 x10E6/uL (ref 3.77–5.28)
RDW: 14 % (ref 11.7–15.4)
WBC: 4.8 10*3/uL (ref 3.4–10.8)

## 2019-07-23 LAB — COMPREHENSIVE METABOLIC PANEL
ALT: 14 IU/L (ref 0–32)
AST: 19 IU/L (ref 0–40)
Albumin/Globulin Ratio: 2 (ref 1.2–2.2)
Albumin: 4.5 g/dL (ref 3.8–4.8)
Alkaline Phosphatase: 53 IU/L (ref 39–117)
BUN/Creatinine Ratio: 12 (ref 9–23)
BUN: 8 mg/dL (ref 6–24)
Bilirubin Total: 0.2 mg/dL (ref 0.0–1.2)
CO2: 24 mmol/L (ref 20–29)
Calcium: 9.4 mg/dL (ref 8.7–10.2)
Chloride: 104 mmol/L (ref 96–106)
Creatinine, Ser: 0.67 mg/dL (ref 0.57–1.00)
GFR calc Af Amer: 120 mL/min/{1.73_m2} (ref >59)
GFR calc non Af Amer: 104 mL/min/{1.73_m2} (ref >59)
Globulin, Total: 2.2 g/dL (ref 1.5–4.5)
Glucose: 84 mg/dL (ref 65–99)
Potassium: 4.7 mmol/L (ref 3.5–5.2)
Sodium: 138 mmol/L (ref 134–144)
Total Protein: 6.7 g/dL (ref 6.0–8.5)

## 2019-07-23 LAB — HEMOGLOBIN A1C
Est. average glucose Bld gHb Est-mCnc: 108 mg/dL
Hgb A1c MFr Bld: 5.4 % (ref 4.8–5.6)

## 2019-07-23 LAB — VITAMIN B12: Vitamin B-12: 301 pg/mL (ref 232–1245)

## 2019-07-23 LAB — FOLATE: Folate: 4.6 ng/mL (ref >3.0)

## 2019-07-23 LAB — LIPID PANEL
Chol/HDL Ratio: 2.9 ratio (ref 0.0–4.4)
Cholesterol, Total: 173 mg/dL (ref 100–199)
HDL: 60 mg/dL (ref >39)
LDL Calculated: 92 mg/dL (ref 0–99)
Triglycerides: 104 mg/dL (ref 0–149)
VLDL Cholesterol Cal: 21 mg/dL (ref 5–40)

## 2019-07-24 ENCOUNTER — Telehealth: Payer: Self-pay

## 2019-07-24 ENCOUNTER — Other Ambulatory Visit: Payer: Self-pay | Admitting: Internal Medicine

## 2019-07-24 MED ORDER — CYANOCOBALAMIN 500 MCG PO TABS: 500.0000 ug | ORAL_TABLET | Freq: Every day | ORAL | 1 refills | Status: DC

## 2019-07-24 MED ORDER — FOLIC ACID 1 MG PO TABS: 1.0000 mg | ORAL_TABLET | Freq: Every day | ORAL | 1 refills | Status: DC

## 2019-07-24 MED FILL — FOLIC ACID 1 MG TABS: 1 | 30 days supply | Qty: 30 | Fill #0

## 2019-07-24 NOTE — Telephone Encounter (Signed)
Contacted pt to go over lab results pt is aware and doesn't have any questions or concerns 

## 2019-08-06 ENCOUNTER — Other Ambulatory Visit: Payer: Self-pay | Admitting: Internal Medicine

## 2019-08-06 DIAGNOSIS — J449 Chronic obstructive pulmonary disease, unspecified: Secondary | ICD-10-CM

## 2019-08-13 ENCOUNTER — Telehealth: Payer: Self-pay

## 2019-08-13 MED FILL — GABAPENTIN 300 MG CAPSULE: 300 | 30 days supply | Qty: 30 | Fill #0

## 2019-08-13 MED FILL — PROAIR HFA 90 MCG INHALER: 108 (90 BAS | 30 days supply | Qty: 9 | Fill #0

## 2019-08-13 MED FILL — ALBUTEROL SUL 2.5 MG/3 ML S: (2.5 MG/3ML | 14 days supply | Qty: 180 | Fill #0

## 2019-08-13 MED FILL — !DULERA 100 MCG/5 MCG INH: 100-5 | 30 days supply | Qty: 13 | Fill #0

## 2019-08-13 MED FILL — IPRATROPIUM BR 0.02% SOLN: 0.02 | 6 days supply | Qty: 63 | Fill #0

## 2019-08-13 NOTE — Telephone Encounter (Signed)
As per [facility], the patient is currently staying at [facility]

## 2019-08-14 ENCOUNTER — Ambulatory Visit: Payer: Self-pay | Attending: Family Medicine | Admitting: Pharmacist

## 2019-08-14 ENCOUNTER — Other Ambulatory Visit: Payer: Self-pay

## 2019-08-14 DIAGNOSIS — J449 Chronic obstructive pulmonary disease, unspecified: Secondary | ICD-10-CM

## 2019-08-14 DIAGNOSIS — Z23 Encounter for immunization: Secondary | ICD-10-CM

## 2019-08-14 MED ORDER — IPRATROPIUM BROMIDE 0.02 % IN SOLN: 0.5000 mg | Freq: Four times a day (QID) | RESPIRATORY_TRACT | 2 refills | Status: DC

## 2019-08-14 MED FILL — IPRATROPIUM BR 0.02% SOLN: 0.02 | 25 days supply | Qty: 250 | Fill #0

## 2019-08-14 MED FILL — predniSONE 10 MG TABS: 10 | 30 days supply | Qty: 34 | Fill #0

## 2019-08-14 NOTE — Progress Notes (Signed)
Patient presents for vaccination against inluenza per orders of Dr. Wynetta Emery. Consent given. Counseling provided. No contraindications exists. Vaccine administered without incident.

## 2019-08-27 ENCOUNTER — Emergency Department (HOSPITAL_COMMUNITY): Payer: Medicaid - Out of State

## 2019-08-27 ENCOUNTER — Encounter (HOSPITAL_COMMUNITY): Payer: Self-pay

## 2019-08-27 ENCOUNTER — Other Ambulatory Visit: Payer: Self-pay

## 2019-08-27 ENCOUNTER — Inpatient Hospital Stay (HOSPITAL_COMMUNITY)
Admission: EM | Admit: 2019-08-27 | Discharge: 2019-09-04 | DRG: 871 | Disposition: A | Discharge status: Home / Self Care | Payer: Medicaid - Out of State | Attending: Internal Medicine | Admitting: Internal Medicine

## 2019-08-27 DIAGNOSIS — F329 Major depressive disorder, single episode, unspecified: Secondary | ICD-10-CM | POA: Diagnosis present

## 2019-08-27 DIAGNOSIS — A419 Sepsis, unspecified organism: Principal | ICD-10-CM | POA: Diagnosis present

## 2019-08-27 DIAGNOSIS — Z87891 Personal history of nicotine dependence: Secondary | ICD-10-CM | POA: Diagnosis not present

## 2019-08-27 DIAGNOSIS — J440 Chronic obstructive pulmonary disease with acute lower respiratory infection: Secondary | ICD-10-CM | POA: Diagnosis present

## 2019-08-27 DIAGNOSIS — Z885 Allergy status to narcotic agent status: Secondary | ICD-10-CM | POA: Diagnosis not present

## 2019-08-27 DIAGNOSIS — F320 Major depressive disorder, single episode, mild: Secondary | ICD-10-CM | POA: Diagnosis present

## 2019-08-27 DIAGNOSIS — Z79899 Other long term (current) drug therapy: Secondary | ICD-10-CM | POA: Diagnosis not present

## 2019-08-27 DIAGNOSIS — Z20828 Contact with and (suspected) exposure to other viral communicable diseases: Secondary | ICD-10-CM | POA: Diagnosis present

## 2019-08-27 DIAGNOSIS — J441 Chronic obstructive pulmonary disease with (acute) exacerbation: Secondary | ICD-10-CM | POA: Diagnosis present

## 2019-08-27 DIAGNOSIS — J189 Pneumonia, unspecified organism: Secondary | ICD-10-CM | POA: Diagnosis present

## 2019-08-27 DIAGNOSIS — J449 Chronic obstructive pulmonary disease, unspecified: Secondary | ICD-10-CM | POA: Diagnosis not present

## 2019-08-27 DIAGNOSIS — Z590 Homelessness: Secondary | ICD-10-CM

## 2019-08-27 DIAGNOSIS — Z825 Family history of asthma and other chronic lower respiratory diseases: Secondary | ICD-10-CM | POA: Diagnosis not present

## 2019-08-27 DIAGNOSIS — G5790 Unspecified mononeuropathy of unspecified lower limb: Secondary | ICD-10-CM | POA: Diagnosis present

## 2019-08-27 DIAGNOSIS — F32A Depression, unspecified: Secondary | ICD-10-CM | POA: Diagnosis present

## 2019-08-27 HISTORY — DX: Pneumonia, unspecified organism: J18.9

## 2019-08-27 LAB — COMPREHENSIVE METABOLIC PANEL
ALT: 14 U/L (ref 0–44)
AST: 19 U/L (ref 15–41)
Albumin: 3.8 g/dL (ref 3.5–5.0)
Alkaline Phosphatase: 59 U/L (ref 38–126)
Anion gap: 11 (ref 5–15)
BUN: 12 mg/dL (ref 6–20)
CO2: 23 mmol/L (ref 22–32)
Calcium: 9 mg/dL (ref 8.9–10.3)
Chloride: 100 mmol/L (ref 98–111)
Creatinine, Ser: 0.84 mg/dL (ref 0.44–1.00)
GFR calc Af Amer: >60 mL/min (ref >60)
GFR calc non Af Amer: >60 mL/min (ref >60)
Glucose, Bld: 124 mg/dL — ABNORMAL HIGH (ref 70–99)
Potassium: 3.8 mmol/L (ref 3.5–5.1)
Sodium: 134 mmol/L — ABNORMAL LOW (ref 135–145)
Total Bilirubin: 0.6 mg/dL (ref 0.3–1.2)
Total Protein: 7.2 g/dL (ref 6.5–8.1)

## 2019-08-27 LAB — CBC WITH DIFFERENTIAL/PLATELET
Abs Immature Granulocytes: 0.56 10*3/uL — ABNORMAL HIGH (ref 0.00–0.07)
Basophils Absolute: 0.1 10*3/uL (ref 0.0–0.1)
Basophils Relative: 0 %
Eosinophils Absolute: 0 10*3/uL (ref 0.0–0.5)
Eosinophils Relative: 0 %
HCT: 37.9 % (ref 36.0–46.0)
Hemoglobin: 12.3 g/dL (ref 12.0–15.0)
Immature Granulocytes: 3 %
Lymphocytes Relative: 3 %
Lymphs Abs: 0.6 10*3/uL — ABNORMAL LOW (ref 0.7–4.0)
MCH: 30.5 pg (ref 26.0–34.0)
MCHC: 32.5 g/dL (ref 30.0–36.0)
MCV: 94 fL (ref 80.0–100.0)
Monocytes Absolute: 1.8 10*3/uL — ABNORMAL HIGH (ref 0.1–1.0)
Monocytes Relative: 8 %
Neutro Abs: 18.9 10*3/uL — ABNORMAL HIGH (ref 1.7–7.7)
Neutrophils Relative %: 86 %
Platelets: 262 10*3/uL (ref 150–400)
RBC: 4.03 MIL/uL (ref 3.87–5.11)
RDW: 15 % (ref 11.5–15.5)
WBC: 22 10*3/uL — ABNORMAL HIGH (ref 4.0–10.5)
nRBC: 0 % (ref 0.0–0.2)

## 2019-08-27 LAB — HCG, QUANTITATIVE, PREGNANCY: hCG, Beta Chain, Quant, S: <1 m[IU]/mL (ref <5)

## 2019-08-27 LAB — I-STAT BETA HCG BLOOD, ED (MC, WL, AP ONLY): I-stat hCG, quantitative: 28.1 m[IU]/mL — ABNORMAL HIGH (ref <5)

## 2019-08-27 LAB — URINALYSIS, ROUTINE W REFLEX MICROSCOPIC
Bacteria, UA: NONE SEEN
Bilirubin Urine: NEGATIVE
Glucose, UA: NEGATIVE mg/dL
Ketones, ur: 80 mg/dL — AB
Leukocytes,Ua: NEGATIVE
Nitrite: NEGATIVE
Protein, ur: 30 mg/dL — AB
Specific Gravity, Urine: 1.021 (ref 1.005–1.030)
pH: 5 (ref 5.0–8.0)

## 2019-08-27 LAB — PREGNANCY, URINE: Preg Test, Ur: NEGATIVE

## 2019-08-27 LAB — APTT: aPTT: 37 seconds — ABNORMAL HIGH (ref 24–36)

## 2019-08-27 LAB — RAPID URINE DRUG SCREEN, HOSP PERFORMED
Amphetamines: NOT DETECTED
Barbiturates: NOT DETECTED
Benzodiazepines: NOT DETECTED
Cocaine: NOT DETECTED
Opiates: NOT DETECTED
Tetrahydrocannabinol: NOT DETECTED

## 2019-08-27 LAB — PROTIME-INR
INR: 1.3 — ABNORMAL HIGH (ref 0.8–1.2)
Prothrombin Time: 16.4 seconds — ABNORMAL HIGH (ref 11.4–15.2)

## 2019-08-27 LAB — SARS CORONAVIRUS 2 BY RT PCR (HOSPITAL ORDER, PERFORMED IN ~~LOC~~ HOSPITAL LAB): SARS Coronavirus 2: NEGATIVE

## 2019-08-27 LAB — LACTIC ACID, PLASMA: Lactic Acid, Venous: 1.6 mmol/L (ref 0.5–1.9)

## 2019-08-27 MED ORDER — ACETAMINOPHEN 325 MG PO TABS
650.0000 mg | ORAL_TABLET | Freq: Four times a day (QID) | ORAL | Status: DC | PRN
  Administered 2019-08-27 – 2019-09-04 (×12): 650 mg via ORAL
  Filled 2019-08-27 (×12): qty 2

## 2019-08-27 MED ORDER — MOMETASONE FURO-FORMOTEROL FUM 100-5 MCG/ACT IN AERO
2.0000 | INHALATION_SPRAY | Freq: Two times a day (BID) | RESPIRATORY_TRACT | Status: DC
  Administered 2019-08-28 (×2): 2 via RESPIRATORY_TRACT
  Filled 2019-08-27: qty 8.8

## 2019-08-27 MED ORDER — SODIUM CHLORIDE 0.9 % IV SOLN
1.0000 g | Freq: Once | INTRAVENOUS | Status: AC
  Administered 2019-08-27: 1 g via INTRAVENOUS
  Filled 2019-08-27: qty 10

## 2019-08-27 MED ORDER — SODIUM CHLORIDE 0.9 % IV SOLN
INTRAVENOUS | Status: AC
  Administered 2019-08-27 – 2019-08-28 (×4): via INTRAVENOUS

## 2019-08-27 MED ORDER — LACTATED RINGERS IV BOLUS
1000.0000 mL | Freq: Once | INTRAVENOUS | Status: AC
  Administered 2019-08-27: 1000 mL via INTRAVENOUS

## 2019-08-27 MED ORDER — ENOXAPARIN SODIUM 40 MG/0.4ML ~~LOC~~ SOLN
40.0000 mg | Freq: Every day | SUBCUTANEOUS | Status: DC
  Administered 2019-08-28 – 2019-09-03 (×8): 40 mg via SUBCUTANEOUS
  Filled 2019-08-27 (×8): qty 0.4

## 2019-08-27 MED ORDER — SODIUM CHLORIDE 0.9 % IV SOLN
2.0000 g | INTRAVENOUS | Status: AC
  Administered 2019-08-28 – 2019-09-01 (×5): 2 g via INTRAVENOUS
  Filled 2019-08-27 (×5): qty 2

## 2019-08-27 MED ORDER — ACETAMINOPHEN 650 MG RE SUPP: 650.0000 mg | Freq: Four times a day (QID) | RECTAL | Status: DC | PRN

## 2019-08-27 MED ORDER — IBUPROFEN 200 MG PO TABS: 600.0000 mg | ORAL_TABLET | Freq: Once | ORAL | Status: DC

## 2019-08-27 MED ORDER — FOLIC ACID 1 MG PO TABS
1.0000 mg | ORAL_TABLET | Freq: Every day | ORAL | Status: DC
  Administered 2019-08-28 – 2019-09-04 (×8): 1 mg via ORAL
  Filled 2019-08-27 (×8): qty 1

## 2019-08-27 MED ORDER — VITAMIN B-12 1000 MCG PO TABS
500.0000 ug | ORAL_TABLET | Freq: Every day | ORAL | Status: DC
  Administered 2019-08-28 – 2019-09-04 (×8): 500 ug via ORAL
  Filled 2019-08-27 (×8): qty 1

## 2019-08-27 MED ORDER — ONDANSETRON HCL 4 MG/2ML IJ SOLN
4.0000 mg | Freq: Once | INTRAMUSCULAR | Status: AC
  Administered 2019-08-27: 20:00:00 4 mg via INTRAVENOUS
  Filled 2019-08-27: qty 2

## 2019-08-27 MED ORDER — ALBUTEROL SULFATE HFA 108 (90 BASE) MCG/ACT IN AERS: 1.0000 | INHALATION_SPRAY | RESPIRATORY_TRACT | Status: DC

## 2019-08-27 MED ORDER — ONDANSETRON HCL 4 MG PO TABS: 4.0000 mg | ORAL_TABLET | Freq: Four times a day (QID) | ORAL | Status: DC | PRN

## 2019-08-27 MED ORDER — AZITHROMYCIN 250 MG PO TABS
500.0000 mg | ORAL_TABLET | Freq: Once | ORAL | Status: AC
  Administered 2019-08-27: 20:00:00 500 mg via ORAL
  Filled 2019-08-27: qty 2

## 2019-08-27 MED ORDER — SODIUM CHLORIDE 0.9 % IV SOLN
500.0000 mg | INTRAVENOUS | Status: DC
  Administered 2019-08-28 – 2019-08-29 (×2): 500 mg via INTRAVENOUS
  Filled 2019-08-27 (×2): qty 500

## 2019-08-27 MED ORDER — ONDANSETRON HCL 4 MG/2ML IJ SOLN: 4.0000 mg | Freq: Four times a day (QID) | INTRAMUSCULAR | Status: DC | PRN

## 2019-08-27 NOTE — H&P (Signed)
History and Physical    Elizabeth AsaMary Villarreal ZOX:[id number] DOB: 06/27/1971 DOA: 08/27/2019  PCP: Marcine MatarJohnson, Deborah B, MD  Patient coming from: Patient lives in a shelter.  Chief Complaint: Fever and chills.  HPI: Elizabeth AsaMary Villarreal is a 48 y.o. female with history of COPD previous tobacco abuse recently placed on gabapentin for neuropathy which patient has not taken presents to the ER with complaint of fever chills.  Patient states her symptoms started this morning and has not had any productive cough until patient reached ER.  Has not had any recent travel but has been living in the shelter for last 1 month.  Denies any caries in the teeth.  Denies drinking alcohol nausea vomiting diarrhea abdominal pain.  Has been examined right-sided pleuritic chest pain.  ED Course: In the ER patient was tachycardic with fever of 103 F lab work show WBC count of 22,000 lactic acid of 1.6 hemoglobin 12 creatinine 0.8 chest x-ray shows right upper lobe infiltrates concerning for pneumonia.  Blood cultures obtained and started on empiric antibiotics for community-acquired pneumonia COVID-19 test was negative initially.  Repeat one has been ordered which is pending.  Patient admitted for sepsis secondary pneumonia.  Review of Systems: As per HPI, rest all negative.   Past Medical History:  Diagnosis Date   COPD (chronic obstructive pulmonary disease) (HCC)    Depression    Tobacco abuse     Past Surgical History:  Procedure Laterality Date   CESAREAN SECTION     ECTOPIC PREGNANCY SURGERY     ovarian cyst rupture     ovary cyst rupture       reports that she quit smoking about 19 months ago. Her smoking use included cigarettes. She smoked 1.00 pack per day. She has never used smokeless tobacco. She reports that she does not drink alcohol or use drugs.  Allergies  Allergen Reactions   Darvon [Propoxyphene] Other (See Comments)    Patient was aware her spoken words were not matching her thoughts     Family History  Problem Relation Age of Onset   COPD Mother    Lung cancer Father     Prior to Admission medications   Medication Sig Start Date End Date Taking? Authorizing Provider  acetaminophen (TYLENOL) 325 MG tablet Take 650 mg by mouth every 6 (six) hours as needed for mild pain.   Yes [provider]  albuterol (PROAIR HFA) 108 (90 Base) MCG/ACT inhaler Inhale 1-2 puffs into the lungs every 6 (six) hours as needed for wheezing or shortness of breath. 07/22/19  Yes Marcine MatarJohnson, Deborah B, MD  albuterol (PROVENTIL) (2.5 MG/3ML) 0.083% nebulizer solution USE ONE VIAL VIA NEBULIZER EVERY SIX HOURS AS NEEDED FOR WHEEZING OR SHORTNESS OF BREATH WITH THE IPRATROPIUM Patient taking differently: 2.5 mg every 6 (six) hours as needed for wheezing or shortness of breath. WITH THE IPRATROPIUM 07/22/19  Yes Marcine MatarJohnson, Deborah B, MD  folic acid (FOLVITE) 1 MG tablet Take 1 tablet (1 mg total) by mouth daily. 07/24/19  Yes Marcine MatarJohnson, Deborah B, MD  gabapentin (NEURONTIN) 300 MG capsule Take 1 capsule (300 mg total) by mouth at bedtime. 07/22/19  Yes Marcine MatarJohnson, Deborah B, MD  ipratropium (ATROVENT) 0.02 % nebulizer solution Take 2.5 mLs (0.5 mg total) by nebulization 4 (four) times daily. 08/14/19  Yes Marcine MatarJohnson, Deborah B, MD  mometasone-formoterol (DULERA) 100-5 MCG/ACT AERO Inhale 2 puffs into the lungs 2 (two) times daily. MUST MAKE APPT FOR FURTHER REFILLS 07/22/19  Yes Marcine MatarJohnson, Deborah B, MD  vitamin B-12 (CYANOCOBALAMIN) 500 MCG tablet Take 1 tablet (500 mcg total) by mouth daily. 07/24/19  Yes Marcine Matar, MD    Physical Exam: Constitutional: Moderately built and nourished. Vitals:   08/27/19 1814 08/27/19 1828 08/27/19 1939  BP: (!) 105/59  (!) 111/59  Pulse: (!) 109 (!) 113 (!) 112  Resp: 20  (!) 25  Temp: (!) 103 F (39.4 C)    TempSrc: Oral    SpO2: 94% (!) 87% 92%  Weight: 63.5 kg    Height: 5\' 8"  (1.727 m)     Eyes: Anicteric no pallor. ENMT: No discharge from the ears eyes  nose or mouth. Neck: No mass felt.  No neck rigidity. Respiratory: No rhonchi or crepitations. Cardiovascular: S1-S2 heard. Abdomen: Soft nontender bowel sounds present. Musculoskeletal: No edema. Skin: No rash. Neurologic: Alert awake oriented to time place and person.  Moves all extremities. Psychiatric: Appears normal per normal affect.   Labs on Admission: I have personally reviewed following labs and imaging studies  CBC: Recent Labs  Lab 08/27/19 1931  WBC 22.0*  NEUTROABS 18.9*  HGB 12.3  HCT 37.9  MCV 94.0  PLT 262   Basic Metabolic Panel: Recent Labs  Lab 08/27/19 1817  NA 134*  K 3.8  CL 100  CO2 23  GLUCOSE 124*  BUN 12  CREATININE 0.84  CALCIUM 9.0   GFR: Estimated Creatinine Clearance: 82.1 mL/min (by C-G formula based on SCr of 0.84 mg/dL). Liver Function Tests: Recent Labs  Lab 08/27/19 1817  AST 19  ALT 14  ALKPHOS 59  BILITOT 0.6  PROT 7.2  ALBUMIN 3.8   No results for input(s): LIPASE, AMYLASE in the last 168 hours. No results for input(s): AMMONIA in the last 168 hours. Coagulation Profile: Recent Labs  Lab 08/27/19 1931  INR 1.3*   Cardiac Enzymes: No results for input(s): CKTOTAL, CKMB, CKMBINDEX, TROPONINI in the last 168 hours. BNP (last 3 results) No results for input(s): PROBNP in the last 8760 hours. HbA1C: No results for input(s): HGBA1C in the last 72 hours. CBG: No results for input(s): GLUCAP in the last 168 hours. Lipid Profile: No results for input(s): CHOL, HDL, LDLCALC, TRIG, CHOLHDL, LDLDIRECT in the last 72 hours. Thyroid Function Tests: No results for input(s): TSH, T4TOTAL, FREET4, T3FREE, THYROIDAB in the last 72 hours. Anemia Panel: No results for input(s): VITAMINB12, FOLATE, FERRITIN, TIBC, IRON, RETICCTPCT in the last 72 hours. Urine analysis: No results found for: COLORURINE, APPEARANCEUR, LABSPEC, PHURINE, GLUCOSEU, HGBUR, BILIRUBINUR, KETONESUR, PROTEINUR, UROBILINOGEN, NITRITE, LEUKOCYTESUR Sepsis  Labs: @LABRCNTIP (procalcitonin:4,lacticidven:4) ) Recent Results (from the past 240 hour(s))  SARS Coronavirus 2 [facility] order, Performed in [facility] hospital lab) Nasopharyngeal Nasopharyngeal Swab     Status: None   Collection Time: 08/27/19  6:17 PM   Specimen: Nasopharyngeal Swab  Result Value Ref Range Status   SARS Coronavirus 2 NEGATIVE NEGATIVE Final    Comment: (NOTE) If result is NEGATIVE SARS-CoV-2 target nucleic acids are NOT DETECTED. The SARS-CoV-2 RNA is generally detectable in upper and lower  respiratory specimens during the acute phase of infection. The lowest  concentration of SARS-CoV-2 viral copies this assay can detect is 250  copies / mL. A negative result does not preclude SARS-CoV-2 infection  and should not be used as the sole basis for treatment or other  patient management decisions.  A negative result may occur with  improper specimen collection / handling, submission of specimen other  than nasopharyngeal swab, presence of viral mutation(s) within  the  areas targeted by this assay, and inadequate number of viral copies  (<250 copies / mL). A negative result must be combined with clinical  observations, patient history, and epidemiological information. If result is POSITIVE SARS-CoV-2 target nucleic acids are DETECTED. The SARS-CoV-2 RNA is generally detectable in upper and lower  respiratory specimens dur ing the acute phase of infection.  Positive  results are indicative of active infection with SARS-CoV-2.  Clinical  correlation with patient history and other diagnostic information is  necessary to determine patient infection status.  Positive results do  not rule out bacterial infection or co-infection with other viruses. If result is PRESUMPTIVE POSTIVE SARS-CoV-2 nucleic acids MAY BE PRESENT.   A presumptive positive result was obtained on the submitted specimen  and confirmed on repeat testing.  While 2019 novel coronavirus  (SARS-CoV-2)  nucleic acids may be present in the submitted sample  additional confirmatory testing may be necessary for epidemiological  and / or clinical management purposes  to differentiate between  SARS-CoV-2 and other Sarbecovirus currently known to infect humans.  If clinically indicated additional testing with an alternate test  methodology [phone]) is advised. The SARS-CoV-2 RNA is generally  detectable in upper and lower respiratory sp ecimens during the acute  phase of infection. The expected result is Negative. Fact Sheet for Patients:  StrictlyIdeas.no Fact Sheet for Healthcare Providers: BankingDealers.co.za This test is not yet approved or cleared by the [place] FDA and has been authorized for detection and/or diagnosis of SARS-CoV-2 by FDA under an Emergency Use Authorization (EUA).  This EUA will remain in effect (meaning this test can be used) for the duration of the COVID-19 declaration under Section 564(b)(1) of the Act, 21 U.S.C. section 360bbb-3(b)(1), unless the authorization is terminated or revoked sooner. Performed at [facility], [place] [address] [place], [place] [address]      Radiological Exams on Admission: Dg Chest Port 1 View  Result Date: 08/27/2019 CLINICAL DATA:  Initial evaluation for acute fever, body aches. EXAM: PORTABLE CHEST 1 VIEW COMPARISON:  Prior radiograph from 03/28/2018 FINDINGS: Cardiac and mediastinal silhouettes are stable in size and contour, and remain within normal limits. Lungs hyperinflated with underlying emphysematous changes. Streaky bibasilar atelectasis and/or scarring. Focal consolidative opacity and infiltrate present within the inferior right upper lobe, compatible with pneumonia. Left lung is largely clear. No pulmonary edema or pleural effusion. No pneumothorax. No acute osseous finding. IMPRESSION: 1. Consolidative right upper lobe infiltrate, consistent with  pneumonia. Followup PA and lateral chest X-ray is recommended in 3-4 weeks following trial of antibiotic therapy to ensure resolution and exclude underlying malignancy. 2. Underlying emphysema with streaky bibasilar atelectasis and/or scarring. Electronically Signed   By: Jeannine Boga M.D.   On: 08/27/2019 19:05    EKG: Independently reviewed.  Sinus tachycardia.  Assessment/Plan Principal Problem:   Sepsis ([place]) Active Problems:   COPD GOLD D  with chronic bronchitis (HCC)   Mild depression ([place])   CAP (community acquired pneumonia)    1. Sepsis secondary to community-acquired pneumonia -patient has been placed on ceftriaxone and Zithromax follow sputum cultures blood cultures continue IV fluids follow lactate procalcitonin.  Second COVID-19 test has been sent out.  Since patient has been living in a shelter with right upper lobe predominant pneumonia will check for TB for which I ordered AFB smears and QuantiFERON gold test. 2. COPD will continue inhalers change to nebulizer once COVID-19 test comes negative. 3. Recently placed on gabapentin which patient needed  to take for neuropathy. 4. Tobacco abuse quit many years ago.  Given the septic picture with pneumonia patient will need more than 2 midnight stay with inpatient status.   DVT prophylaxis: Lovenox. Code Status: Full code. Family Communication: Discussed with patient. Disposition Plan: Home. Consults called: None. Admission status: Inpatient.   Eduard Clos MD [facility] Pager [phone].  If 7PM-7AM, please contact night-coverage [email or web address] Password [facility]  08/27/2019, 9:29 PM

## 2019-08-27 NOTE — ED Triage Notes (Signed)
EMS v/s 120/74, HR 120, RR 18, CBG 153

## 2019-08-27 NOTE — ED Notes (Signed)
ED TO INPATIENT HANDOFF REPORT  ED Nurse Name and Phone #: [phone]/ Kurtis Bushmaneleste   S Name/Age/Gender Elizabeth Villarreal 48 y.o. female Room/Bed: WA05/WA05  Code Status   Code Status: Full Code  Home/SNF/Other Home Patient oriented to: self, place, time and situation Is this baseline? Yes   Triage Complete: Triage complete  Chief Complaint Fever; Body Aches  Triage Note Pt presents via EMS from [facility]. Pt reports fever and body aches since 6 pm last night. Pt reports she has been taking tylenol, and has been sitting outside all morning. Facility reports 103.   EMS v/s 120/74, HR 120, RR 18, CBG 153   Allergies Allergies  Allergen Reactions  . Darvon [Propoxyphene] Other (See Comments)    Patient was aware her spoken words were not matching her thoughts    Level of Care/Admitting Diagnosis ED Disposition    ED Disposition Condition Comment   Admit  [facility] [[id number]]  Level of Care: Telemetry [5]  Admit to tele based on following criteria: Monitor for Ischemic changes  Covid Evaluation: Person Under Investigation (PUI)  Diagnosis: Sepsis [facility](HCC) [[id number]]) [[id number]]  Admitting Physician: Eduard ClosKAKRAKANDY, ARSHAD N [phone][[id number]]  Attending Physician: Eduard ClosKAKRAKANDY, ARSHAD N [phone][[id number]]  Estimated length of stay: past midnight tomorrow  Certification:: I certify this patient will need inpatient services for at least 2 midnights  PT Class (Do Not Modify): Inpatient [101]  PT Acc Code (Do Not Modify): Private [1]       B Medical/Surgery History Past Medical History:  Diagnosis Date  . COPD (chronic obstructive pulmonary disease) (HCC)   . Depression   . Tobacco abuse    Past Surgical History:  Procedure Laterality Date  . CESAREAN SECTION    . ECTOPIC PREGNANCY SURGERY    . ovarian cyst rupture    . ovary cyst rupture       A IV Location/Drains/Wounds Patient Lines/Drains/Airways Status   Active Line/Drains/Airways    Name:    Placement date:   Placement time:   Site:   Days:   Peripheral IV 08/27/19 Right;Upper Arm   08/27/19    1820    Arm   less than 1          Intake/Output Last 24 hours  Intake/Output Summary (Last 24 hours) at 08/27/2019 2220 Last data filed at 08/27/2019 2148 Gross per 24 hour  Intake 1100 ml  Output -  Net 1100 ml    Labs/Imaging Results for orders placed or performed during the hospital encounter of 08/27/19 (from the past 48 hour(s))  Lactic acid, plasma     Status: None   Collection Time: 08/27/19  6:17 PM  Result Value Ref Range   Lactic Acid, Venous 1.6 0.5 - 1.9 mmol/L    Comment: Performed at [facility], [address] [place], [place] [address]  Comprehensive metabolic panel     Status: Abnormal   Collection Time: 08/27/19  6:17 PM  Result Value Ref Range   Sodium 134 (L) 135 - 145 mmol/L   Potassium 3.8 3.5 - 5.1 mmol/L   Chloride 100 98 - 111 mmol/L   CO2 23 22 - 32 mmol/L   Glucose, Bld 124 (H) 70 - 99 mg/dL   BUN 12 6 - 20 mg/dL   Creatinine, Ser [id number] 0.44 - 1.00 mg/dL   Calcium 9.0 8.9 - 32.410.3 mg/dL   Total Protein 7.2 6.5 - 8.1 g/dL   Albumin 3.8 3.5 - 5.0 g/dL   AST  19 15 - 41 U/L   ALT 14 0 - 44 U/L   Alkaline Phosphatase 59 38 - 126 U/L   Total Bilirubin 0.6 0.3 - 1.2 mg/dL   GFR calc non Af Amer >60 >60 mL/min   GFR calc Af Amer >60 >60 mL/min   Anion gap 11 5 - 15    Comment: Performed at [facility], [place] [address] [place], [place] [address]  SARS Coronavirus 2 [facility] order, Performed in [facility] hospital lab) Nasopharyngeal Nasopharyngeal Swab     Status: None   Collection Time: 08/27/19  6:17 PM   Specimen: Nasopharyngeal Swab  Result Value Ref Range   SARS Coronavirus 2 NEGATIVE NEGATIVE    Comment: (NOTE) If result is NEGATIVE SARS-CoV-2 target nucleic acids are NOT DETECTED. The SARS-CoV-2 RNA is generally detectable in upper and lower  respiratory specimens during the acute phase of  infection. The lowest  concentration of SARS-CoV-2 viral copies this assay can detect is 250  copies / mL. A negative result does not preclude SARS-CoV-2 infection  and should not be used as the sole basis for treatment or other  patient management decisions.  A negative result may occur with  improper specimen collection / handling, submission of specimen other  than nasopharyngeal swab, presence of viral mutation(s) within the  areas targeted by this assay, and inadequate number of viral copies  (<250 copies / mL). A negative result must be combined with clinical  observations, patient history, and epidemiological information. If result is POSITIVE SARS-CoV-2 target nucleic acids are DETECTED. The SARS-CoV-2 RNA is generally detectable in upper and lower  respiratory specimens dur ing the acute phase of infection.  Positive  results are indicative of active infection with SARS-CoV-2.  Clinical  correlation with patient history and other diagnostic information is  necessary to determine patient infection status.  Positive results do  not rule out bacterial infection or co-infection with other viruses. If result is PRESUMPTIVE POSTIVE SARS-CoV-2 nucleic acids MAY BE PRESENT.   A presumptive positive result was obtained on the submitted specimen  and confirmed on repeat testing.  While 2019 novel coronavirus  (SARS-CoV-2) nucleic acids may be present in the submitted sample  additional confirmatory testing may be necessary for epidemiological  and / or clinical management purposes  to differentiate between  SARS-CoV-2 and other Sarbecovirus currently known to infect humans.  If clinically indicated additional testing with an alternate test  methodology [phone]) is advised. The SARS-CoV-2 RNA is generally  detectable in upper and lower respiratory sp ecimens during the acute  phase of infection. The expected result is Negative. Fact Sheet for Patients:   StrictlyIdeas.no Fact Sheet for Healthcare Providers: BankingDealers.co.za This test is not yet approved or cleared by the [place] FDA and has been authorized for detection and/or diagnosis of SARS-CoV-2 by FDA under an Emergency Use Authorization (EUA).  This EUA will remain in effect (meaning this test can be used) for the duration of the COVID-19 declaration under Section 564(b)(1) of the Act, 21 U.S.C. section 360bbb-3(b)(1), unless the authorization is terminated or revoked sooner. Performed at [facility], [place] [address] [place], [place] [address]   I-Stat beta hCG blood, ED     Status: Abnormal   Collection Time: 08/27/19  6:31 PM  Result Value Ref Range   I-stat hCG, quantitative 28.1 (H) <5 mIU/mL   Comment 3            Comment:   GEST.  AGE      CONC.  (mIU/mL)   <=1 WEEK        5 - 50     2 WEEKS       50 - 500     3 WEEKS       100 - 10,000     4 WEEKS     1,000 - 30,000        FEMALE AND NON-PREGNANT FEMALE:     LESS THAN 5 mIU/mL   CBC with Differential/Platelet     Status: Abnormal   Collection Time: 08/27/19  7:31 PM  Result Value Ref Range   WBC 22.0 (H) 4.0 - 10.5 K/uL   RBC 4.03 3.87 - 5.11 MIL/uL   Hemoglobin 12.3 12.0 - 15.0 g/dL   HCT 62.9 52.8 - 41.3 %   MCV 94.0 80.0 - 100.0 fL   MCH 30.5 26.0 - 34.0 pg   MCHC 32.5 30.0 - 36.0 g/dL   RDW 24.4 01.0 - 27.2 %   Platelets 262 150 - 400 K/uL    Comment: REPEATED TO VERIFY SPECIMEN CHECKED FOR CLOTS CONSISTENT WITH PREVIOUS RESULT    nRBC 0.0 0.0 - 0.2 %   Neutrophils Relative % 86 %   Neutro Abs 18.9 (H) 1.7 - 7.7 K/uL   Lymphocytes Relative 3 %   Lymphs Abs 0.6 (L) 0.7 - 4.0 K/uL   Monocytes Relative 8 %   Monocytes Absolute 1.8 (H) 0.1 - 1.0 K/uL   Eosinophils Relative 0 %   Eosinophils Absolute 0.0 0.0 - 0.5 K/uL   Basophils Relative 0 %   Basophils Absolute 0.1 0.0 - 0.1 K/uL   Immature Granulocytes 3 %   Abs Immature  Granulocytes 0.56 (H) 0.00 - 0.07 K/uL    Comment: Performed at [facility], [address] [place], [place] [address]  Protime-INR     Status: Abnormal   Collection Time: 08/27/19  7:31 PM  Result Value Ref Range   Prothrombin Time 16.4 (H) 11.4 - 15.2 seconds   INR 1.3 (H) 0.8 - 1.2    Comment: (NOTE) INR goal varies based on device and disease states. Performed at [facility], [address] [place], [place] [address]   APTT     Status: Abnormal   Collection Time: 08/27/19  7:31 PM  Result Value Ref Range   aPTT 37 (H) 24 - 36 seconds    Comment:        IF BASELINE aPTT IS ELEVATED, SUGGEST PATIENT RISK ASSESSMENT BE USED TO DETERMINE APPROPRIATE ANTICOAGULANT THERAPY. Performed at [facility], [address] [place], [place] [address]   hCG, quantitative, pregnancy     Status: None   Collection Time: 08/27/19  7:31 PM  Result Value Ref Range   hCG, Beta Chain, Quant, S <1 <5 mIU/mL    Comment:          GEST. AGE      CONC.  (mIU/mL)   <=1 WEEK        5 - 50     2 WEEKS       50 - 500     3 WEEKS       100 - 10,000     4 WEEKS     1,000 - 30,000     5 WEEKS     3,500 - 115,000   6-8 WEEKS     12,000 - 270,000    12 WEEKS  15,000 - 220,000        FEMALE AND NON-PREGNANT FEMALE:     LESS THAN 5 mIU/mL Performed at [facility], [address] [place], [place] [address]    Dg Chest Port 1 View  Result Date: 08/27/2019 CLINICAL DATA:  Initial evaluation for acute fever, body aches. EXAM: PORTABLE CHEST 1 VIEW COMPARISON:  Prior radiograph from 03/28/2018 FINDINGS: Cardiac and mediastinal silhouettes are stable in size and contour, and remain within normal limits. Lungs hyperinflated with underlying emphysematous changes. Streaky bibasilar atelectasis and/or scarring. Focal consolidative opacity and infiltrate present within the inferior right upper lobe, compatible with pneumonia. Left lung  is largely clear. No pulmonary edema or pleural effusion. No pneumothorax. No acute osseous finding. IMPRESSION: 1. Consolidative right upper lobe infiltrate, consistent with pneumonia. Followup PA and lateral chest X-ray is recommended in 3-4 weeks following trial of antibiotic therapy to ensure resolution and exclude underlying malignancy. 2. Underlying emphysema with streaky bibasilar atelectasis and/or scarring. Electronically Signed   By: Rise Mu M.D.   On: 08/27/2019 19:05    Pending Labs Unresulted Labs (From admission, onward)    Start     Ordered   09/03/19 0500  Creatinine, serum  (enoxaparin (LOVENOX)    CrCl >/= 30 ml/min)  Weekly,   R    Comments: while on enoxaparin therapy    08/27/19 2129   08/28/19 0500  Basic metabolic panel  Tomorrow morning,   R     08/27/19 2129   08/28/19 0500  CBC  Tomorrow morning,   R     08/27/19 2129   08/27/19 2131  Urine rapid drug screen (hosp performed)  ONCE - STAT,   STAT     08/27/19 2130   08/27/19 2131  Pregnancy, urine  ONCE - STAT,   STAT     08/27/19 2131   08/27/19 2130  Procalcitonin - Baseline  ONCE - STAT,   STAT     08/27/19 2129   08/27/19 2128  HIV antibody (Routine Testing)  Once,   STAT     08/27/19 2128   08/27/19 2128  CBC  (enoxaparin (LOVENOX)    CrCl >/= 30 ml/min)  Once,   STAT    Comments: Baseline for enoxaparin therapy IF NOT ALREADY DRAWN.  Notify MD if PLT < 100 K.    08/27/19 2129   08/27/19 2128  Creatinine, serum  (enoxaparin (LOVENOX)    CrCl >/= 30 ml/min)  Once,   STAT    Comments: Baseline for enoxaparin therapy IF NOT ALREADY DRAWN.    08/27/19 2129   08/27/19 2123  SARS CORONAVIRUS 2 (TAT 6-24 HRS) Nasopharyngeal Nasopharyngeal Swab  (Asymptomatic/Tier 2 Patients Labs)  ONCE - STAT,   STAT    Question Answer Comment  Is this test for diagnosis or screening Screening   Symptomatic for COVID-19 as defined by CDC No   Hospitalized for COVID-19 No   Admitted to ICU for COVID-19 No    Previously tested for COVID-19 Yes   Resident in a congregate (group) care setting Yes   Employed in healthcare setting No   Pregnant No      08/27/19 2123   08/27/19 1800  Lactic acid, plasma  Now then every 2 hours,   STAT     08/27/19 1800   08/27/19 1800  CBC WITH DIFFERENTIAL  ONCE - STAT,   STAT     08/27/19 1800   08/27/19 1800  Blood Culture (routine x  2)  BLOOD CULTURE X 2,   STAT     08/27/19 1800   08/27/19 1800  Urinalysis, Routine w reflex microscopic  ONCE - STAT,   STAT     08/27/19 1800   08/27/19 1800  Urine culture  ONCE - STAT,   STAT     08/27/19 1800          Vitals/Pain Today's Vitals   08/27/19 1828 08/27/19 1939 08/27/19 2130 08/27/19 2208  BP:  (!) 111/59 (!) 109/53   Pulse: (!) 113 (!) 112 (!) 115   Resp:  (!) 25 (!) 24   Temp:    (!) 103.2 F (39.6 C)  TempSrc:    Oral  SpO2: (!) 87% 92% 92%   Weight:      Height:      PainSc:        Isolation Precautions No active isolations  Medications Medications  folic acid (FOLVITE) tablet 1 mg (has no administration in time range)  vitamin B-12 (CYANOCOBALAMIN) tablet 500 mcg (has no administration in time range)  mometasone-formoterol (DULERA) 100-5 MCG/ACT inhaler 2 puff (has no administration in time range)  acetaminophen (TYLENOL) tablet 650 mg (has no administration in time range)    Or  acetaminophen (TYLENOL) suppository 650 mg (has no administration in time range)  ondansetron (ZOFRAN) tablet 4 mg (has no administration in time range)    Or  ondansetron (ZOFRAN) injection 4 mg (has no administration in time range)  cefTRIAXone (ROCEPHIN) 2 g in sodium chloride 0.9 % 100 mL IVPB (has no administration in time range)  azithromycin (ZITHROMAX) 500 mg in sodium chloride 0.9 % 250 mL IVPB (has no administration in time range)  enoxaparin (LOVENOX) injection 40 mg (has no administration in time range)  0.9 %  sodium chloride infusion (has no administration in time range)  ondansetron (ZOFRAN)  injection 4 mg (4 mg Intravenous Given 08/27/19 1933)  lactated ringers bolus 1,000 mL (0 mLs Intravenous Stopped 08/27/19 2148)  cefTRIAXone (ROCEPHIN) 1 g in sodium chloride 0.9 % 100 mL IVPB (0 g Intravenous Stopped 08/27/19 2148)  azithromycin (ZITHROMAX) tablet 500 mg (500 mg Oral Given 08/27/19 1934)    Mobility walks with person assist Low fall risk   Focused Assessments Pulmonary Assessment Handoff:  Lung sounds:   O2 Device: Room Air     ,    R Recommendations: See Admitting Provider Note  Report given to:   Additional Notes: Pt o2 saturations dropping in the 88% RA placed pt on 1-4 lpm via [place] to maintain sats > 94%

## 2019-08-27 NOTE — ED Notes (Signed)
P 
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
## 2019-08-27 NOTE — ED Notes (Signed)
Attempted to obtain second set of blood cultures from the pt's right hand, but was unsuccessful. hcg quant was obtained and set to lab. RN aware.

## 2019-08-27 NOTE — ED Triage Notes (Signed)
Pt presents via EMS from [facility]. Pt reports fever and body aches since 6 pm last night. Pt reports she has been taking tylenol, and has been sitting outside all morning. Facility reports 103.

## 2019-08-27 NOTE — ED Notes (Signed)
Pt blood cultures drawn x 1. Dr. Rex Kras made aware one set was drawn. Provider vo to discontinue 2nd lactic and cultures.

## 2019-08-28 DIAGNOSIS — F320 Major depressive disorder, single episode, mild: Secondary | ICD-10-CM

## 2019-08-28 DIAGNOSIS — J449 Chronic obstructive pulmonary disease, unspecified: Secondary | ICD-10-CM

## 2019-08-28 LAB — CBC
HCT: 37.9 % (ref 36.0–46.0)
HCT: 38.9 % (ref 36.0–46.0)
Hemoglobin: 12.1 g/dL (ref 12.0–15.0)
Hemoglobin: 12.3 g/dL (ref 12.0–15.0)
MCH: 30.1 pg (ref 26.0–34.0)
MCH: 30.4 pg (ref 26.0–34.0)
MCHC: 31.6 g/dL (ref 30.0–36.0)
MCHC: 31.9 g/dL (ref 30.0–36.0)
MCV: 94.3 fL (ref 80.0–100.0)
MCV: 96.3 fL (ref 80.0–100.0)
Platelets: 238 10*3/uL (ref 150–400)
Platelets: 239 10*3/uL (ref 150–400)
RBC: 4.02 MIL/uL (ref 3.87–5.11)
RBC: 4.04 MIL/uL (ref 3.87–5.11)
RDW: 15.2 % (ref 11.5–15.5)
RDW: 15.3 % (ref 11.5–15.5)
WBC: 17.7 10*3/uL — ABNORMAL HIGH (ref 4.0–10.5)
WBC: 18 10*3/uL — ABNORMAL HIGH (ref 4.0–10.5)
nRBC: 0 % (ref 0.0–0.2)
nRBC: 0 % (ref 0.0–0.2)

## 2019-08-28 LAB — HIV ANTIBODY (ROUTINE TESTING W REFLEX): HIV Screen 4th Generation wRfx: NONREACTIVE

## 2019-08-28 LAB — TROPONIN I (HIGH SENSITIVITY)
Troponin I (High Sensitivity): 8 ng/L (ref <18)
Troponin I (High Sensitivity): 8 ng/L (ref <18)

## 2019-08-28 LAB — BASIC METABOLIC PANEL
Anion gap: 7 (ref 5–15)
BUN: 13 mg/dL (ref 6–20)
CO2: 22 mmol/L (ref 22–32)
Calcium: 7.9 mg/dL — ABNORMAL LOW (ref 8.9–10.3)
Chloride: 107 mmol/L (ref 98–111)
Creatinine, Ser: 0.8 mg/dL (ref 0.44–1.00)
GFR calc Af Amer: >60 mL/min (ref >60)
GFR calc non Af Amer: >60 mL/min (ref >60)
Glucose, Bld: 120 mg/dL — ABNORMAL HIGH (ref 70–99)
Potassium: 3.3 mmol/L — ABNORMAL LOW (ref 3.5–5.1)
Sodium: 136 mmol/L (ref 135–145)

## 2019-08-28 LAB — URINE CULTURE: Culture: NO GROWTH

## 2019-08-28 LAB — EXPECTORATED SPUTUM ASSESSMENT W GRAM STAIN, RFLX TO RESP C

## 2019-08-28 LAB — CREATININE, SERUM
Creatinine, Ser: 0.67 mg/dL (ref 0.44–1.00)
GFR calc Af Amer: >60 mL/min (ref >60)
GFR calc non Af Amer: >60 mL/min (ref >60)

## 2019-08-28 LAB — SARS CORONAVIRUS 2 BY RT PCR (HOSPITAL ORDER, PERFORMED IN ~~LOC~~ HOSPITAL LAB): SARS Coronavirus 2: NEGATIVE

## 2019-08-28 LAB — PROCALCITONIN: Procalcitonin: 5.87 ng/mL

## 2019-08-28 LAB — LACTIC ACID, PLASMA
Lactic Acid, Venous: 1.2 mmol/L (ref 0.5–1.9)
Lactic Acid, Venous: 1.5 mmol/L (ref 0.5–1.9)

## 2019-08-28 MED ORDER — ALBUTEROL SULFATE HFA 108 (90 BASE) MCG/ACT IN AERS
1.0000 | INHALATION_SPRAY | RESPIRATORY_TRACT | Status: DC | PRN
  Administered 2019-08-28: 2 via RESPIRATORY_TRACT
  Filled 2019-08-28: qty 6.7

## 2019-08-28 MED ORDER — IPRATROPIUM-ALBUTEROL 0.5-2.5 (3) MG/3ML IN SOLN
3.0000 mL | RESPIRATORY_TRACT | Status: DC | PRN
  Administered 2019-08-29 – 2019-08-30 (×2): 3 mL via RESPIRATORY_TRACT
  Filled 2019-08-28 (×2): qty 3

## 2019-08-28 MED ORDER — POTASSIUM CHLORIDE CRYS ER 20 MEQ PO TBCR: 20.0000 meq | EXTENDED_RELEASE_TABLET | Freq: Two times a day (BID) | ORAL | Status: DC

## 2019-08-28 MED ORDER — SODIUM CHLORIDE 0.9 % IV BOLUS
2000.0000 mL | Freq: Once | INTRAVENOUS | Status: AC
  Administered 2019-08-28: 2000 mL via INTRAVENOUS

## 2019-08-28 MED ORDER — IPRATROPIUM-ALBUTEROL 0.5-2.5 (3) MG/3ML IN SOLN
3.0000 mL | RESPIRATORY_TRACT | Status: DC | PRN
  Administered 2019-08-28: 3 mL via RESPIRATORY_TRACT
  Filled 2019-08-28 (×2): qty 3

## 2019-08-28 MED ORDER — IBUPROFEN 800 MG PO TABS
800.0000 mg | ORAL_TABLET | Freq: Once | ORAL | Status: AC
  Administered 2019-08-28: 800 mg via ORAL
  Filled 2019-08-28: qty 1

## 2019-08-28 MED ORDER — IPRATROPIUM-ALBUTEROL 0.5-2.5 (3) MG/3ML IN SOLN
3.0000 mL | Freq: Four times a day (QID) | RESPIRATORY_TRACT | Status: DC
  Administered 2019-08-28 (×2): 3 mL via RESPIRATORY_TRACT
  Filled 2019-08-28 (×2): qty 3

## 2019-08-28 MED ORDER — ALBUTEROL SULFATE HFA 108 (90 BASE) MCG/ACT IN AERS
2.0000 | INHALATION_SPRAY | RESPIRATORY_TRACT | Status: DC | PRN
  Administered 2019-08-28 – 2019-09-01 (×8): 2 via RESPIRATORY_TRACT

## 2019-08-28 MED ORDER — IPRATROPIUM-ALBUTEROL 0.5-2.5 (3) MG/3ML IN SOLN: 3.0000 mL | Freq: Four times a day (QID) | RESPIRATORY_TRACT | Status: DC

## 2019-08-28 MED ORDER — SODIUM CHLORIDE 0.9 % IV SOLN: INTRAVENOUS | Status: AC

## 2019-08-28 MED ORDER — LABETALOL HCL 5 MG/ML IV SOLN
5.0000 mg | INTRAVENOUS | Status: DC | PRN
  Administered 2019-08-28 (×2): 5 mg via INTRAVENOUS
  Filled 2019-08-28 (×4): qty 4

## 2019-08-28 MED ORDER — IPRATROPIUM-ALBUTEROL 0.5-2.5 (3) MG/3ML IN SOLN: 3.0000 mL | RESPIRATORY_TRACT | Status: DC | PRN

## 2019-08-28 MED ORDER — POTASSIUM CHLORIDE CRYS ER 20 MEQ PO TBCR
40.0000 meq | EXTENDED_RELEASE_TABLET | Freq: Once | ORAL | Status: AC
  Administered 2019-08-28: 40 meq via ORAL
  Filled 2019-08-28: qty 2

## 2019-08-28 MED ORDER — POTASSIUM CHLORIDE CRYS ER 20 MEQ PO TBCR
20.0000 meq | EXTENDED_RELEASE_TABLET | Freq: Two times a day (BID) | ORAL | Status: DC
  Administered 2019-08-29 – 2019-09-04 (×13): 20 meq via ORAL
  Filled 2019-08-28 (×13): qty 1

## 2019-08-28 MED ORDER — HYDROXYZINE HCL 10 MG PO TABS
10.0000 mg | ORAL_TABLET | Freq: Three times a day (TID) | ORAL | Status: DC | PRN
  Administered 2019-08-28 – 2019-09-03 (×6): 10 mg via ORAL
  Filled 2019-08-28 (×8): qty 1

## 2019-08-28 MED ORDER — METHYLPREDNISOLONE SODIUM SUCC 40 MG IJ SOLR
40.0000 mg | Freq: Two times a day (BID) | INTRAMUSCULAR | Status: DC
  Administered 2019-08-28 – 2019-09-01 (×9): 40 mg via INTRAVENOUS
  Filled 2019-08-28 (×9): qty 1

## 2019-08-28 NOTE — Progress Notes (Signed)
PROGRESS NOTE    Elizabeth Villarreal  CBS:[id number] DOB: [birth year] DOA: 08/27/2019 PCP: Marcine Matar, MD    Brief Narrative:  48 y.o. female with history of COPD previous tobacco abuse recently placed on gabapentin for neuropathy which patient has not taken presents to the ER with complaint of fever chills.  Patient states her symptoms started this morning and has not had any productive cough until patient reached ER.  Has not had any recent travel but has been living in the shelter for last 1 month.  Denies any caries in the teeth.  Denies drinking alcohol nausea vomiting diarrhea abdominal pain.  Has been examined right-sided pleuritic chest pain.  ED Course: In the ER patient was tachycardic with fever of 103 F lab work show WBC count of 22,000 lactic acid of 1.6 hemoglobin 12 creatinine 0.8 chest x-ray shows right upper lobe infiltrates concerning for pneumonia.  Blood cultures obtained and started on empiric antibiotics for community-acquired pneumonia COVID-19 test was negative initially.  Repeat one has been ordered which is pending.  Patient admitted for sepsis secondary pneumonia.  Assessment & Plan:   Principal Problem:   Sepsis (HCC) Active Problems:   COPD GOLD D  with chronic bronchitis (HCC)   Mild depression (HCC)   CAP (community acquired pneumonia)   1. Sepsis secondary to community-acquired pneumonia 1. COVID neg 2. -patient has been placed on ceftriaxone and Zithromax follow sputum cultures blood cultures 3. Will repeat CBC in AM 4.  QuantiFERON gold test pending 5. Now with low grade temp of 100.39F 2. COPD exacerbation 1. Very limited air movement this AM 2. COVID is neg, thus have started pt on duonebs as needed 3. Have started IV solumedrol BID 4. Wean O2 as tolerated 3. Neuropathy. 1. On gabapentin prior to admit which pt reportedly has not taken yet 2. If pt noted to be symptomatic, then would start 4. Tobacco abuse quit many years ago. 1. Pt  congratulated   DVT prophylaxis: Lovenox subQ Code Status: Full Family Communication: Pt in room, family not at bedside Disposition Plan: Uncertain at this time  Consultants:     Procedures:     Antimicrobials: Anti-infectives (From admission, onward)   Start     Dose/Rate Route Frequency Ordered Stop   08/28/19 1600  azithromycin (ZITHROMAX) 500 mg in sodium chloride 0.9 % 250 mL IVPB     500 mg 250 mL/hr over 60 Minutes Intravenous Every 24 hours 08/27/19 2129 09/02/19 1559   08/28/19 1000  cefTRIAXone (ROCEPHIN) 2 g in sodium chloride 0.9 % 100 mL IVPB     2 g 200 mL/hr over 30 Minutes Intravenous Every 24 hours 08/27/19 2129 09/02/19 0959   08/27/19 1915  cefTRIAXone (ROCEPHIN) 1 g in sodium chloride 0.9 % 100 mL IVPB     1 g 200 mL/hr over 30 Minutes Intravenous  Once 08/27/19 1910 08/27/19 2148   08/27/19 1915  azithromycin (ZITHROMAX) tablet 500 mg     500 mg Oral  Once 08/27/19 1910 08/27/19 1934       Subjective: Complaining of sob  Objective: Vitals:   08/28/19 1351 08/28/19 1510 08/28/19 1552 08/28/19 1726  BP: 106/60 139/78 129/73 121/69  Pulse: (!) 115 (!) 125 (!) 123 (!) 117  Resp: 20 (!) 24 (!) 22 (!) 23  Temp:    (!) 100.5 F (38.1 C)  TempSrc:      SpO2: 96% 92% 95% 93%  Weight:      Height:  Intake/Output Summary (Last 24 hours) at 08/28/2019 1902 Last data filed at 08/28/2019 0600 Gross per 24 hour  Intake 3240.27 ml  Output -  Net 3240.27 ml   Filed Weights   08/27/19 1814 08/27/19 2340  Weight: 63.5 kg 61.5 kg    Examination:  General exam: Appears calm and comfortable  Respiratory system: Clear to auscultation. Respiratory effort normal. Cardiovascular system: S1 & S2 heard, RRR Gastrointestinal system: Abdomen is nondistended, soft and nontender. No organomegaly or masses felt. Normal bowel sounds heard. Central nervous system: Alert and oriented. No focal neurological deficits. Extremities: Symmetric 5 x 5 power.  Skin: No rashes, lesions Psychiatry: Judgement and insight appear normal. Mood & affect appropriate.   Data Reviewed: I have personally reviewed following labs and imaging studies  CBC: Recent Labs  Lab 08/27/19 1931 08/27/19 2348 08/28/19 0350  WBC 22.0* 18.0* 17.7*  NEUTROABS 18.9*  --   --   HGB 12.3 12.3 12.1  HCT 37.9 38.9 37.9  MCV 94.0 96.3 94.3  PLT 262 238 239   Basic Metabolic Panel: Recent Labs  Lab 08/27/19 1817 08/27/19 2348 08/28/19 0350  NA 134*  --  136  K 3.8  --  3.3*  CL 100  --  107  CO2 23  --  22  GLUCOSE 124*  --  120*  BUN 12  --  13  CREATININE 0.84 0.67 0.80  CALCIUM 9.0  --  7.9*   GFR: Estimated Creatinine Clearance: 83.5 mL/min (by C-G formula based on SCr of 0.8 mg/dL). Liver Function Tests: Recent Labs  Lab 08/27/19 1817  AST 19  ALT 14  ALKPHOS 59  BILITOT 0.6  PROT 7.2  ALBUMIN 3.8   No results for input(s): LIPASE, AMYLASE in the last 168 hours. No results for input(s): AMMONIA in the last 168 hours. Coagulation Profile: Recent Labs  Lab 08/27/19 1931  INR 1.3*   Cardiac Enzymes: No results for input(s): CKTOTAL, CKMB, CKMBINDEX, TROPONINI in the last 168 hours. BNP (last 3 results) No results for input(s): PROBNP in the last 8760 hours. HbA1C: No results for input(s): HGBA1C in the last 72 hours. CBG: No results for input(s): GLUCAP in the last 168 hours. Lipid Profile: No results for input(s): CHOL, HDL, LDLCALC, TRIG, CHOLHDL, LDLDIRECT in the last 72 hours. Thyroid Function Tests: No results for input(s): TSH, T4TOTAL, FREET4, T3FREE, THYROIDAB in the last 72 hours. Anemia Panel: No results for input(s): VITAMINB12, FOLATE, FERRITIN, TIBC, IRON, RETICCTPCT in the last 72 hours. Sepsis Labs: Recent Labs  Lab 08/27/19 1817 08/27/19 2348 08/28/19 0519  PROCALCITON  --  5.87  --   LATICACIDVEN 1.6 1.2 1.5    Recent Results (from the past 240 hour(s))  SARS Coronavirus 2 [facility] order, Performed in [facility] hospital lab) Nasopharyngeal Nasopharyngeal Swab     Status: None   Collection Time: 08/27/19  6:17 PM   Specimen: Nasopharyngeal Swab  Result Value Ref Range Status   SARS Coronavirus 2 NEGATIVE NEGATIVE Final    Comment: (NOTE) If result is NEGATIVE SARS-CoV-2 target nucleic acids are NOT DETECTED. The SARS-CoV-2 RNA is generally detectable in upper and lower  respiratory specimens during the acute phase of infection. The lowest  concentration of SARS-CoV-2 viral copies this assay can detect is 250  copies / mL. A negative result does not preclude SARS-CoV-2 infection  and should not be used as the sole basis for treatment or other  patient management decisions.  A negative result may occur  with  improper specimen collection / handling, submission of specimen other  than nasopharyngeal swab, presence of viral mutation(s) within the  areas targeted by this assay, and inadequate number of viral copies  (<250 copies / mL). A negative result must be combined with clinical  observations, patient history, and epidemiological information. If result is POSITIVE SARS-CoV-2 target nucleic acids are DETECTED. The SARS-CoV-2 RNA is generally detectable in upper and lower  respiratory specimens dur ing the acute phase of infection.  Positive  results are indicative of active infection with SARS-CoV-2.  Clinical  correlation with patient history and other diagnostic information is  necessary to determine patient infection status.  Positive results do  not rule out bacterial infection or co-infection with other viruses. If result is PRESUMPTIVE POSTIVE SARS-CoV-2 nucleic acids MAY BE PRESENT.   A presumptive positive result was obtained on the submitted specimen  and confirmed on repeat testing.  While 2019 novel coronavirus  (SARS-CoV-2) nucleic acids may be present in the submitted sample  additional confirmatory testing may be necessary for epidemiological  and / or clinical management  purposes  to differentiate between  SARS-CoV-2 and other Sarbecovirus currently known to infect humans.  If clinically indicated additional testing with an alternate test  methodology [phone]) is advised. The SARS-CoV-2 RNA is generally  detectable in upper and lower respiratory sp ecimens during the acute  phase of infection. The expected result is Negative. Fact Sheet for Patients:  StrictlyIdeas.no Fact Sheet for Healthcare Providers: BankingDealers.co.za This test is not yet approved or cleared by the [place] FDA and has been authorized for detection and/or diagnosis of SARS-CoV-2 by FDA under an Emergency Use Authorization (EUA).  This EUA will remain in effect (meaning this test can be used) for the duration of the COVID-19 declaration under Section 564(b)(1) of the Act, 21 U.S.C. section 360bbb-3(b)(1), unless the authorization is terminated or revoked sooner. Performed at [facility], [place] [address] [place], [place] [address]   Blood Culture (routine x 2)     Status: None (Preliminary result)   Collection Time: 08/27/19  7:30 PM   Specimen: BLOOD  Result Value Ref Range Status   Specimen Description   Final    BLOOD BLOOD LEFT HAND Performed at [facility] [address], [place], [place] [address]    Special Requests   Final    BOTTLES DRAWN AEROBIC AND ANAEROBIC Blood Culture results may not be optimal due to an inadequate volume of blood received in culture bottles Performed at [facility] [address] [place], [place] [address]    Culture   Final    NO GROWTH < 24 HOURS Performed at [facility] [address] [place], [place] [address]    Report Status PENDING  Incomplete  SARS Coronavirus 2 [facility] order, Performed in [facility] hospital lab) Nasopharyngeal Nasopharyngeal Swab     Status: None   Collection Time: 08/27/19  9:23 PM    Specimen: Nasopharyngeal Swab  Result Value Ref Range Status   SARS Coronavirus 2 NEGATIVE NEGATIVE Final    Comment: (NOTE) If result is NEGATIVE SARS-CoV-2 target nucleic acids are NOT DETECTED. The SARS-CoV-2 RNA is generally detectable in upper and lower  respiratory specimens during the acute phase of infection. The lowest  concentration of SARS-CoV-2 viral copies this assay can detect is 250  copies / mL. A negative result does not preclude SARS-CoV-2 infection  and should not be used as the  sole basis for treatment or other  patient management decisions.  A negative result may occur with  improper specimen collection / handling, submission of specimen other  than nasopharyngeal swab, presence of viral mutation(s) within the  areas targeted by this assay, and inadequate number of viral copies  (<250 copies / mL). A negative result must be combined with clinical  observations, patient history, and epidemiological information. If result is POSITIVE SARS-CoV-2 target nucleic acids are DETECTED. The SARS-CoV-2 RNA is generally detectable in upper and lower  respiratory specimens dur ing the acute phase of infection.  Positive  results are indicative of active infection with SARS-CoV-2.  Clinical  correlation with patient history and other diagnostic information is  necessary to determine patient infection status.  Positive results do  not rule out bacterial infection or co-infection with other viruses. If result is PRESUMPTIVE POSTIVE SARS-CoV-2 nucleic acids MAY BE PRESENT.   A presumptive positive result was obtained on the submitted specimen  and confirmed on repeat testing.  While 2019 novel coronavirus  (SARS-CoV-2) nucleic acids may be present in the submitted sample  additional confirmatory testing may be necessary for epidemiological  and / or clinical management purposes  to differentiate between  SARS-CoV-2 and other Sarbecovirus currently known to infect humans.  If  clinically indicated additional testing with an alternate test  methodology [phone]) is advised. The SARS-CoV-2 RNA is generally  detectable in upper and lower respiratory sp ecimens during the acute  phase of infection. The expected result is Negative. Fact Sheet for Patients:  BoilerBrush.com.cy Fact Sheet for Healthcare Providers: https://pope.com/ This test is not yet approved or cleared by the [place] FDA and has been authorized for detection and/or diagnosis of SARS-CoV-2 by FDA under an Emergency Use Authorization (EUA).  This EUA will remain in effect (meaning this test can be used) for the duration of the COVID-19 declaration under Section 564(b)(1) of the Act, 21 U.S.C. section 360bbb-3(b)(1), unless the authorization is terminated or revoked sooner. Performed at [facility], [address], [place], [place] [address]   Expectorated sputum assessment w rflx to resp cult     Status: None   Collection Time: 08/28/19  9:27 AM   Specimen: Sputum  Result Value Ref Range Status   Specimen Description SPUTUM  Final   Special Requests NONE  Final   Sputum evaluation   Final    Sputum specimen not acceptable for testing.  Please recollect.   INFORMED DANA @0945  ON 9.23.2020 BY [facility] Performed at [facility], [address] [place], [place] [address]    Report Status 08/28/2019 FINAL  Final     Radiology Studies: Dg Chest Port 1 View  Result Date: 08/27/2019 CLINICAL DATA:  Initial evaluation for acute fever, body aches. EXAM: PORTABLE CHEST 1 VIEW COMPARISON:  Prior radiograph from 03/28/2018 FINDINGS: Cardiac and mediastinal silhouettes are stable in size and contour, and remain within normal limits. Lungs hyperinflated with underlying emphysematous changes. Streaky bibasilar atelectasis and/or scarring. Focal consolidative opacity and infiltrate present within the inferior right  upper lobe, compatible with pneumonia. Left lung is largely clear. No pulmonary edema or pleural effusion. No pneumothorax. No acute osseous finding. IMPRESSION: 1. Consolidative right upper lobe infiltrate, consistent with pneumonia. Followup PA and lateral chest X-ray is recommended in 3-4 weeks following trial of antibiotic therapy to ensure resolution and exclude underlying malignancy. 2. Underlying emphysema with streaky bibasilar atelectasis and/or scarring. Electronically Signed   By: Janell Quiet.D.  On: 08/27/2019 19:05    Scheduled Meds: . enoxaparin (LOVENOX) injection  40 mg Subcutaneous QHS  . folic acid  1 mg Oral Daily  . ipratropium-albuterol  3 mL Nebulization Q6H  . methylPREDNISolone (SOLU-MEDROL) injection  40 mg Intravenous Q12H  . mometasone-formoterol  2 puff Inhalation BID  . [START ON 08/29/2019] potassium chloride  20 mEq Oral BID  . vitamin B-12  500 mcg Oral Daily   Continuous Infusions: . sodium chloride 125 mL/hr at 08/28/19 1540  . azithromycin 500 mg (08/28/19 1539)  . cefTRIAXone (ROCEPHIN)  IV 2 g (08/28/19 0913)     LOS: 1 day   Rickey BarbaraStephen Raylinn Kosar, MD [facility] Pager On Amion  If 7PM-7AM, please contact night-coverage 08/28/2019, 7:02 PM

## 2019-08-28 NOTE — Progress Notes (Signed)
   08/28/19 0905  Vitals  Temp 98.4 F (36.9 C)  Temp Source Oral  BP 95/64  MAP (mmHg) 75  BP Location Left Arm  BP Method Automatic  Patient Position (if appropriate) Sitting  Pulse Rate (!) 117  Pulse Rate Source Monitor  Resp (!) 25  Oxygen Therapy  SpO2 95 %  O2 Device Nasal Cannula  O2 Flow Rate (L/min) 2 L/min  MEWS Score  MEWS RR 1  MEWS Pulse 2  MEWS Systolic 1  MEWS LOC 0  MEWS Temp 0  MEWS Score 4  MEWS Score Color Red  MEWS Assessment  Is this an acute change? Yes  MEWS guidelines implemented *See Ava  Provider Notification  Provider Name/Title Dr Wyline Copas  Date Provider Notified 08/28/19  Time Provider Notified 0930  Notification Type Call  Notification Reason Change in status  Response See new orders  Date of Provider Response 08/28/19  Time of Provider Response [phone]

## 2019-08-28 NOTE — Progress Notes (Signed)
   08/28/19 1510  Vitals  BP 139/78  MAP (mmHg) 95  BP Location Left Arm  BP Method Automatic  Patient Position (if appropriate) Sitting  Pulse Rate (!) 125  Pulse Rate Source Monitor  Resp (!) 24  Oxygen Therapy  SpO2 92 %  O2 Device Nasal Cannula  O2 Flow Rate (L/min) 3 L/min  MEWS Score  MEWS RR 1  MEWS Pulse 2  MEWS Systolic 0  MEWS LOC 0  MEWS Temp 0  MEWS Score 3  MEWS Score Color Yellow  MEWS Assessment  Is this an acute change? No

## 2019-08-29 LAB — COMPREHENSIVE METABOLIC PANEL
ALT: 15 U/L (ref 0–44)
AST: 18 U/L (ref 15–41)
Albumin: 2.7 g/dL — ABNORMAL LOW (ref 3.5–5.0)
Alkaline Phosphatase: 46 U/L (ref 38–126)
Anion gap: 9 (ref 5–15)
BUN: 10 mg/dL (ref 6–20)
CO2: 20 mmol/L — ABNORMAL LOW (ref 22–32)
Calcium: 8 mg/dL — ABNORMAL LOW (ref 8.9–10.3)
Chloride: 109 mmol/L (ref 98–111)
Creatinine, Ser: 0.57 mg/dL (ref 0.44–1.00)
GFR calc Af Amer: >60 mL/min (ref >60)
GFR calc non Af Amer: >60 mL/min (ref >60)
Glucose, Bld: 230 mg/dL — ABNORMAL HIGH (ref 70–99)
Potassium: 4 mmol/L (ref 3.5–5.1)
Sodium: 138 mmol/L (ref 135–145)
Total Bilirubin: 0.3 mg/dL (ref 0.3–1.2)
Total Protein: 5.6 g/dL — ABNORMAL LOW (ref 6.5–8.1)

## 2019-08-29 LAB — ACID FAST SMEAR (AFB, MYCOBACTERIA): Acid Fast Smear: NEGATIVE

## 2019-08-29 LAB — CBC
HCT: 36.6 % (ref 36.0–46.0)
Hemoglobin: 11.4 g/dL — ABNORMAL LOW (ref 12.0–15.0)
MCH: 30.2 pg (ref 26.0–34.0)
MCHC: 31.1 g/dL (ref 30.0–36.0)
MCV: 97.1 fL (ref 80.0–100.0)
Platelets: 263 10*3/uL (ref 150–400)
RBC: 3.77 MIL/uL — ABNORMAL LOW (ref 3.87–5.11)
RDW: 15.9 % — ABNORMAL HIGH (ref 11.5–15.5)
WBC: 20.2 10*3/uL — ABNORMAL HIGH (ref 4.0–10.5)
nRBC: 0 % (ref 0.0–0.2)

## 2019-08-29 MED ORDER — MOMETASONE FURO-FORMOTEROL FUM 100-5 MCG/ACT IN AERO
2.0000 | INHALATION_SPRAY | Freq: Two times a day (BID) | RESPIRATORY_TRACT | Status: DC
  Administered 2019-08-29 – 2019-09-04 (×13): 2 via RESPIRATORY_TRACT
  Filled 2019-08-29: qty 8.8

## 2019-08-29 MED ORDER — IPRATROPIUM BROMIDE 0.02 % IN SOLN
0.5000 mg | Freq: Four times a day (QID) | RESPIRATORY_TRACT | Status: DC
  Administered 2019-08-29 (×3): 0.5 mg via RESPIRATORY_TRACT
  Filled 2019-08-29 (×3): qty 2.5

## 2019-08-29 MED ORDER — LEVALBUTEROL HCL 0.63 MG/3ML IN NEBU
0.6300 mg | INHALATION_SOLUTION | Freq: Four times a day (QID) | RESPIRATORY_TRACT | Status: DC
  Administered 2019-08-29 (×3): 0.63 mg via RESPIRATORY_TRACT
  Filled 2019-08-29 (×3): qty 3

## 2019-08-29 MED ORDER — LEVALBUTEROL HCL 0.63 MG/3ML IN NEBU
0.6300 mg | INHALATION_SOLUTION | Freq: Four times a day (QID) | RESPIRATORY_TRACT | Status: DC
  Administered 2019-08-30 – 2019-09-02 (×12): 0.63 mg via RESPIRATORY_TRACT
  Filled 2019-08-29 (×12): qty 3

## 2019-08-29 MED ORDER — IPRATROPIUM BROMIDE 0.02 % IN SOLN
0.5000 mg | Freq: Four times a day (QID) | RESPIRATORY_TRACT | Status: DC
  Administered 2019-08-30 – 2019-09-02 (×12): 0.5 mg via RESPIRATORY_TRACT
  Filled 2019-08-29 (×12): qty 2.5

## 2019-08-29 NOTE — Progress Notes (Signed)
PROGRESS NOTE    Elizabeth Villarreal  BPZ:[id number] DOB: 1971/11/19 DOA: 08/27/2019 PCP: Ladell Pier, MD    Brief Narrative:  48 y.o. female with history of COPD previous tobacco abuse recently placed on gabapentin for neuropathy which patient has not taken presents to the ER with complaint of fever chills.  Patient states her symptoms started this morning and has not had any productive cough until patient reached ER.  Has not had any recent travel but has been living in the shelter for last 1 month.  Denies any caries in the teeth.  Denies drinking alcohol nausea vomiting diarrhea abdominal pain.  Has been examined right-sided pleuritic chest pain.  ED Course: In the ER patient was tachycardic with fever of 103 F lab work show WBC count of 22,000 lactic acid of 1.6 hemoglobin 12 creatinine 0.8 chest x-ray shows right upper lobe infiltrates concerning for pneumonia.  Blood cultures obtained and started on empiric antibiotics for community-acquired pneumonia COVID-19 test was negative initially.  Repeat one has been ordered which is pending.  Patient admitted for sepsis secondary pneumonia.  Assessment & Plan:   Principal Problem:   Sepsis ([place]) Active Problems:   COPD GOLD D  with chronic bronchitis ([place])   Mild depression ([place])   CAP (community acquired pneumonia)   1. Sepsis secondary to community-acquired pneumonia 1. COVID neg 2. -patient has been placed on ceftriaxone and Zithromax follow sputum cultures blood cultures 3. Will repeat CBC in AM 4.  As pt lives at shelter, concern for risk of TB exposure. QuantiFERON gold test remains pending 5. Tmax of 103.82F on 9/22. Fevers improving 2. COPD exacerbation 1. Air movement improving 2. COVID is neg, thus will continue pt on duonebs as needed 3. Will continue IV solumedrol BID 4. Still requires 3LNC this AM. Pt is O2 naive, thus will continue current plan and wean O2 as tolerated 3. Neuropathy. 1. On gabapentin prior to admit  which pt reportedly has not taken yet 2. If pt noted to be symptomatic in hospital, then would start 4. Tobacco abuse quit many years ago. 1. Pt congratulated 2. Declines nicotine patch   DVT prophylaxis: Lovenox subQ Code Status: Full Family Communication: Pt in room, family not at bedside Disposition Plan: Uncertain at this time  Consultants:     Procedures:     Antimicrobials: Anti-infectives (From admission, onward)   Start     Dose/Rate Route Frequency Ordered Stop   08/28/19 1600  azithromycin (ZITHROMAX) 500 mg in sodium chloride 0.9 % 250 mL IVPB     500 mg 250 mL/hr over 60 Minutes Intravenous Every 24 hours 08/27/19 2129 09/02/19 1559   08/28/19 1000  cefTRIAXone (ROCEPHIN) 2 g in sodium chloride 0.9 % 100 mL IVPB     2 g 200 mL/hr over 30 Minutes Intravenous Every 24 hours 08/27/19 2129 09/02/19 0959   08/27/19 1915  cefTRIAXone (ROCEPHIN) 1 g in sodium chloride 0.9 % 100 mL IVPB     1 g 200 mL/hr over 30 Minutes Intravenous  Once 08/27/19 1910 08/27/19 2148   08/27/19 1915  azithromycin (ZITHROMAX) tablet 500 mg     500 mg Oral  Once 08/27/19 1910 08/27/19 1934      Subjective: Still with some sob, but reports feeling better  Objective: Vitals:   08/29/19 0526 08/29/19 0839 08/29/19 1423 08/29/19 1550  BP: 102/67   128/75  Pulse: (!) 106   (!) 114  Resp: 16   20  Temp: 98 F (36.7  C)   98.3 F (36.8 C)  TempSrc: Oral   Oral  SpO2: 95% 96% 95% 97%  Weight:      Height:        Intake/Output Summary (Last 24 hours) at 08/29/2019 1903 Last data filed at 08/29/2019 0600 Gross per 24 hour  Intake 2560.99 ml  Output 800 ml  Net 1760.99 ml   Filed Weights   08/27/19 1814 08/27/19 2340  Weight: 63.5 kg 61.5 kg    Examination: General exam: Awake, laying in bed, in nad Respiratory system: Normal respiratory effort, no wheezing Cardiovascular system: regular rate, s1, s2 Gastrointestinal system: Soft, nondistended, positive BS Central nervous  system: CN2-12 grossly intact, strength intact Extremities: Perfused, no clubbing Skin: Normal skin turgor, no notable skin lesions seen Psychiatry: Mood normal // no visual hallucinations   Data Reviewed: I have personally reviewed following labs and imaging studies  CBC: Recent Labs  Lab 08/27/19 1931 08/27/19 2348 08/28/19 0350 08/29/19 0446  WBC 22.0* 18.0* 17.7* 20.2*  NEUTROABS 18.9*  --   --   --   HGB 12.3 12.3 12.1 11.4*  HCT 37.9 38.9 37.9 36.6  MCV 94.0 96.3 94.3 97.1  PLT 262 238 239 263   Basic Metabolic Panel: Recent Labs  Lab 08/27/19 1817 08/27/19 2348 08/28/19 0350 08/29/19 0446  NA 134*  --  136 138  K 3.8  --  3.3* 4.0  CL 100  --  107 109  CO2 23  --  22 20*  GLUCOSE 124*  --  120* 230*  BUN 12  --  13 10  CREATININE 0.84 0.67 0.80 0.57  CALCIUM 9.0  --  7.9* 8.0*   GFR: Estimated Creatinine Clearance: 83.5 mL/min (by C-G formula based on SCr of 0.57 mg/dL). Liver Function Tests: Recent Labs  Lab 08/27/19 1817 08/29/19 0446  AST 19 18  ALT 14 15  ALKPHOS 59 46  BILITOT 0.6 0.3  PROT 7.2 5.6*  ALBUMIN 3.8 2.7*   No results for input(s): LIPASE, AMYLASE in the last 168 hours. No results for input(s): AMMONIA in the last 168 hours. Coagulation Profile: Recent Labs  Lab 08/27/19 1931  INR 1.3*   Cardiac Enzymes: No results for input(s): CKTOTAL, CKMB, CKMBINDEX, TROPONINI in the last 168 hours. BNP (last 3 results) No results for input(s): PROBNP in the last 8760 hours. HbA1C: No results for input(s): HGBA1C in the last 72 hours. CBG: No results for input(s): GLUCAP in the last 168 hours. Lipid Profile: No results for input(s): CHOL, HDL, LDLCALC, TRIG, CHOLHDL, LDLDIRECT in the last 72 hours. Thyroid Function Tests: No results for input(s): TSH, T4TOTAL, FREET4, T3FREE, THYROIDAB in the last 72 hours. Anemia Panel: No results for input(s): VITAMINB12, FOLATE, FERRITIN, TIBC, IRON, RETICCTPCT in the last 72 hours. Sepsis Labs:  Recent Labs  Lab 08/27/19 1817 08/27/19 2348 08/28/19 0519  PROCALCITON  --  5.87  --   LATICACIDVEN 1.6 1.2 1.5    Recent Results (from the past 240 hour(s))  SARS Coronavirus 2 [facility] order, Performed in [facility] hospital lab) Nasopharyngeal Nasopharyngeal Swab     Status: None   Collection Time: 08/27/19  6:17 PM   Specimen: Nasopharyngeal Swab  Result Value Ref Range Status   SARS Coronavirus 2 NEGATIVE NEGATIVE Final    Comment: (NOTE) If result is NEGATIVE SARS-CoV-2 target nucleic acids are NOT DETECTED. The SARS-CoV-2 RNA is generally detectable in upper and lower  respiratory specimens during the acute phase of infection. The lowest  concentration of SARS-CoV-2 viral copies this assay can detect is 250  copies / mL. A negative result does not preclude SARS-CoV-2 infection  and should not be used as the sole basis for treatment or other  patient management decisions.  A negative result may occur with  improper specimen collection / handling, submission of specimen other  than nasopharyngeal swab, presence of viral mutation(s) within the  areas targeted by this assay, and inadequate number of viral copies  (<250 copies / mL). A negative result must be combined with clinical  observations, patient history, and epidemiological information. If result is POSITIVE SARS-CoV-2 target nucleic acids are DETECTED. The SARS-CoV-2 RNA is generally detectable in upper and lower  respiratory specimens dur ing the acute phase of infection.  Positive  results are indicative of active infection with SARS-CoV-2.  Clinical  correlation with patient history and other diagnostic information is  necessary to determine patient infection status.  Positive results do  not rule out bacterial infection or co-infection with other viruses. If result is PRESUMPTIVE POSTIVE SARS-CoV-2 nucleic acids MAY BE PRESENT.   A presumptive positive result was obtained on the submitted specimen  and  confirmed on repeat testing.  While 2019 novel coronavirus  (SARS-CoV-2) nucleic acids may be present in the submitted sample  additional confirmatory testing may be necessary for epidemiological  and / or clinical management purposes  to differentiate between  SARS-CoV-2 and other Sarbecovirus currently known to infect humans.  If clinically indicated additional testing with an alternate test  methodology [phone]) is advised. The SARS-CoV-2 RNA is generally  detectable in upper and lower respiratory sp ecimens during the acute  phase of infection. The expected result is Negative. Fact Sheet for Patients:  BoilerBrush.com.cy Fact Sheet for Healthcare Providers: https://pope.com/ This test is not yet approved or cleared by the [place] FDA and has been authorized for detection and/or diagnosis of SARS-CoV-2 by FDA under an Emergency Use Authorization (EUA).  This EUA will remain in effect (meaning this test can be used) for the duration of the COVID-19 declaration under Section 564(b)(1) of the Act, 21 U.S.C. section 360bbb-3(b)(1), unless the authorization is terminated or revoked sooner. Performed at [facility], [address], [place], [place] [address]   Blood Culture (routine x 2)     Status: None (Preliminary result)   Collection Time: 08/27/19  7:30 PM   Specimen: BLOOD  Result Value Ref Range Status   Specimen Description   Final    BLOOD BLOOD LEFT HAND Performed at [facility], [address], [place], [place] [address]    Special Requests   Final    BOTTLES DRAWN AEROBIC AND ANAEROBIC Blood Culture results may not be optimal due to an inadequate volume of blood received in culture bottles Performed at [facility], [address], [place], [place] [address]    Culture   Final    NO GROWTH 2 DAYS Performed at [facility], [address]  [place], [place] [address]    Report Status PENDING  Incomplete  SARS Coronavirus 2 [facility] order, Performed in [facility] hospital lab) Nasopharyngeal Nasopharyngeal Swab     Status: None   Collection Time: 08/27/19  9:23 PM   Specimen: Nasopharyngeal Swab  Result Value Ref Range Status   SARS Coronavirus 2 NEGATIVE NEGATIVE Final    Comment: (NOTE) If result is NEGATIVE SARS-CoV-2 target nucleic acids are NOT DETECTED. The SARS-CoV-2 RNA is generally detectable in  upper and lower  respiratory specimens during the acute phase of infection. The lowest  concentration of SARS-CoV-2 viral copies this assay can detect is 250  copies / mL. A negative result does not preclude SARS-CoV-2 infection  and should not be used as the sole basis for treatment or other  patient management decisions.  A negative result may occur with  improper specimen collection / handling, submission of specimen other  than nasopharyngeal swab, presence of viral mutation(s) within the  areas targeted by this assay, and inadequate number of viral copies  (<250 copies / mL). A negative result must be combined with clinical  observations, patient history, and epidemiological information. If result is POSITIVE SARS-CoV-2 target nucleic acids are DETECTED. The SARS-CoV-2 RNA is generally detectable in upper and lower  respiratory specimens dur ing the acute phase of infection.  Positive  results are indicative of active infection with SARS-CoV-2.  Clinical  correlation with patient history and other diagnostic information is  necessary to determine patient infection status.  Positive results do  not rule out bacterial infection or co-infection with other viruses. If result is PRESUMPTIVE POSTIVE SARS-CoV-2 nucleic acids MAY BE PRESENT.   A presumptive positive result was obtained on the submitted specimen  and confirmed on repeat testing.  While 2019 novel coronavirus  (SARS-CoV-2) nucleic acids may be present in the  submitted sample  additional confirmatory testing may be necessary for epidemiological  and / or clinical management purposes  to differentiate between  SARS-CoV-2 and other Sarbecovirus currently known to infect humans.  If clinically indicated additional testing with an alternate test  methodology [phone]) is advised. The SARS-CoV-2 RNA is generally  detectable in upper and lower respiratory sp ecimens during the acute  phase of infection. The expected result is Negative. Fact Sheet for Patients:  BoilerBrush.com.cy Fact Sheet for Healthcare Providers: https://pope.com/ This test is not yet approved or cleared by the [place] FDA and has been authorized for detection and/or diagnosis of SARS-CoV-2 by FDA under an Emergency Use Authorization (EUA).  This EUA will remain in effect (meaning this test can be used) for the duration of the COVID-19 declaration under Section 564(b)(1) of the Act, 21 U.S.C. section 360bbb-3(b)(1), unless the authorization is terminated or revoked sooner. Performed at [facility], [address] [place], [place] [address]   Urine culture     Status: None   Collection Time: 08/27/19 10:00 PM   Specimen: In/Out Cath Urine  Result Value Ref Range Status   Specimen Description   Final    IN/OUT CATH URINE Performed at [facility], [address] [place], [place] [address]    Special Requests   Final    NONE Performed at [facility], [address], [place], [place] [address]    Culture   Final    NO GROWTH Performed at [facility], [address], [place], [place] [address]    Report Status 08/28/2019 FINAL  Final  Blood Culture (routine x 2)     Status: None (Preliminary result)   Collection Time: 08/27/19 11:48 PM   Specimen: BLOOD  Result Value Ref Range Status   Specimen Description   Final    BLOOD LEFT WRIST Performed at  [facility], [address], [place], [place] [address]    Special Requests   Final    BOTTLES DRAWN AEROBIC ONLY Blood Culture adequate volume Performed at [facility], [address] [place], [place] [address]    Culture   Final    NO GROWTH 1 DAY Performed at [facility], [address] [place], [place] [address]    Report Status PENDING  Incomplete  Expectorated sputum assessment w rflx to resp cult     Status: None   Collection Time: 08/28/19  9:27 AM   Specimen: Sputum  Result Value Ref Range Status   Specimen Description SPUTUM  Final   Special Requests NONE  Final   Sputum evaluation   Final    Sputum specimen not acceptable for testing.  Please recollect.   INFORMED DANA @0945  ON 9.23.2020 BY [facility] Performed at [facility], [address] [place], [place] [address]    Report Status 08/28/2019 FINAL  Final  Acid Fast Smear (AFB)     Status: None   Collection Time: 08/28/19  2:47 PM   Specimen: Sputum  Result Value Ref Range Status   AFB Specimen Processing Concentration  Final   Acid Fast Smear Negative  Final    Comment: (NOTE) Performed At: [facility] [address] [place], [place] [id number] Jolene SchimkeNagendra Sanjai MD OZ:[phone]    Source (AFB) SPUTUM  Final    Comment: Performed at [facility], [address] [place], [place] [address]     Radiology Studies: No results found.  Scheduled Meds: . enoxaparin (LOVENOX) injection  40 mg Subcutaneous QHS  . folic acid  1 mg Oral Daily  . ipratropium  0.5 mg Nebulization Q6H  . levalbuterol  0.63 mg Nebulization Q6H  . methylPREDNISolone (SOLU-MEDROL) injection  40 mg Intravenous Q12H  . mometasone-formoterol  2 puff Inhalation BID  . potassium chloride  20 mEq Oral BID  . vitamin B-12  500 mcg Oral Daily   Continuous Infusions: . azithromycin 500 mg (08/29/19 1735)  . cefTRIAXone (ROCEPHIN)  IV 2 g (08/29/19  1132)     LOS: 2 days   Rickey BarbaraStephen Tatsuya Okray, MD [facility] Pager On Amion  If 7PM-7AM, please contact night-coverage 08/29/2019, 7:03 PM

## 2019-08-29 NOTE — Progress Notes (Signed)
Patient called out stating "I cannot breathe." Work of breathing increased and grunting noted upon inspiration. Oxygen saturations in high 80's on 3L [place]. HR in 130s. Respiratory called for breathing treatment. After treatment, patient with slight decrease in work of breathing, oxygen 93% on 5 liters and HR still in 130s. Patient reported that her breathing was feeling mostly better. Patient encouraged to focus on breathing slowly and relaxation techniques to further decrease work of breathing and heart rate. Rapid response notified of episode. Carnella [place] I

## 2019-08-30 LAB — QUANTIFERON-TB GOLD PLUS (RQFGPL)
QuantiFERON Mitogen Value: 0.38 IU/mL
QuantiFERON Nil Value: 0.04 IU/mL
QuantiFERON TB1 Ag Value: 0.06 IU/mL
QuantiFERON TB2 Ag Value: 0.06 IU/mL

## 2019-08-30 LAB — QUANTIFERON-TB GOLD PLUS: QuantiFERON-TB Gold Plus: UNDETERMINED — AB

## 2019-08-30 MED ORDER — GABAPENTIN 300 MG PO CAPS
300.0000 mg | ORAL_CAPSULE | Freq: Every day | ORAL | Status: DC
  Administered 2019-08-30 – 2019-09-01 (×3): 300 mg via ORAL
  Filled 2019-08-30 (×3): qty 1

## 2019-08-30 MED ORDER — AZITHROMYCIN 250 MG PO TABS
500.0000 mg | ORAL_TABLET | Freq: Every day | ORAL | Status: AC
  Administered 2019-08-30 – 2019-09-01 (×3): 500 mg via ORAL
  Filled 2019-08-30 (×3): qty 2

## 2019-08-30 NOTE — Progress Notes (Signed)
PROGRESS NOTE    Elizabeth Villarreal  ZOX:[id number] DOB: 1971-10-30 DOA: 08/27/2019 PCP: Marcine MatarJohnson, Deborah B, MD    Brief Narrative:  48 y.o. female with history of COPD previous tobacco abuse recently placed on gabapentin for neuropathy which patient has not taken presents to the ER with complaint of fever chills.  Patient states her symptoms started this morning and has not had any productive cough until patient reached ER.  Has not had any recent travel but has been living in the shelter for last 1 month.  Denies any caries in the teeth.  Denies drinking alcohol nausea vomiting diarrhea abdominal pain.  Has been examined right-sided pleuritic chest pain.  ED Course: In the ER patient was tachycardic with fever of 103 F lab work show WBC count of 22,000 lactic acid of 1.6 hemoglobin 12 creatinine 0.8 chest x-ray shows right upper lobe infiltrates concerning for pneumonia.  Blood cultures obtained and started on empiric antibiotics for community-acquired pneumonia COVID-19 test was negative initially.  Repeat one has been ordered which is pending.  Patient admitted for sepsis secondary pneumonia.  Assessment & Plan:   Principal Problem:   Sepsis (HCC) Active Problems:   COPD GOLD D  with chronic bronchitis (HCC)   Mild depression (HCC)   CAP (community acquired pneumonia)   1. Sepsis secondary to community-acquired pneumonia 1. COVID neg 2. patient has been continued on ceftriaxone and Zithromax follow sputum cultures blood cultures 3. Recheck CBC in AM 4.  As pt lives at shelter, concern for risk of TB exposure. QuantiFERON gold test remains pending. AFB is neg x 1 5. Tmax of 103.41F on 9/22. Fevers resolving 2. COPD exacerbation 1. Air movement steadily improving with steroids and nebs 2. COVID is neg 3. Will continue IV solumedrol BID 4. Remains on 3LNC this AM. Pt is O2 naive, thus will continue current plan and wean O2 as tolerated 3. Neuropathy. 1. On gabapentin prior to admit  which pt reportedly has not taken yet 2. Pt complains of LE neuropathy pains this AM. Will start gabapentin per home regimen 4. Tobacco abuse quit many years ago. 1. Pt congratulated earlier 2. Declines nicotine patch   DVT prophylaxis: Lovenox subQ Code Status: Full Family Communication: Pt in room, family not at bedside Disposition Plan: Uncertain at this time  Consultants:     Procedures:     Antimicrobials: Anti-infectives (From admission, onward)   Start     Dose/Rate Route Frequency Ordered Stop   08/30/19 1800  azithromycin (ZITHROMAX) tablet 500 mg     500 mg Oral Daily-1800 08/30/19 1046 09/02/19 1759   08/28/19 1600  azithromycin (ZITHROMAX) 500 mg in sodium chloride 0.9 % 250 mL IVPB  Status:  Discontinued     500 mg 250 mL/hr over 60 Minutes Intravenous Every 24 hours 08/27/19 2129 08/30/19 1046   08/28/19 1000  cefTRIAXone (ROCEPHIN) 2 g in sodium chloride 0.9 % 100 mL IVPB     2 g 200 mL/hr over 30 Minutes Intravenous Every 24 hours 08/27/19 2129 09/02/19 0959   08/27/19 1915  cefTRIAXone (ROCEPHIN) 1 g in sodium chloride 0.9 % 100 mL IVPB     1 g 200 mL/hr over 30 Minutes Intravenous  Once 08/27/19 1910 08/27/19 2148   08/27/19 1915  azithromycin (ZITHROMAX) tablet 500 mg     500 mg Oral  Once 08/27/19 1910 08/27/19 1934      Subjective: Still with some wheezing, but reports feeling better today  Objective: Vitals:   08/30/19  0981 08/30/19 0854 08/30/19 1012 08/30/19 1531  BP: 126/87  120/77 128/83  Pulse: 93  100 84  Resp:   18 20  Temp: 99.3 F (37.4 C)  98.5 F (36.9 C) 98 F (36.7 C)  TempSrc: Oral  Axillary Oral  SpO2: 96% 94% 94% 96%  Weight:      Height:        Intake/Output Summary (Last 24 hours) at 08/30/2019 1644 Last data filed at 08/30/2019 1500 Gross per 24 hour  Intake 980.04 ml  Output -  Net 980.04 ml   Filed Weights   08/27/19 1814 08/27/19 2340  Weight: 63.5 kg 61.5 kg    Examination: General exam: Conversant, in  no acute distress Respiratory system: Normal resp effort, end-expiratory wheezing Cardiovascular system: regular rhythm, s1-s2 Gastrointestinal system: Nondistended, nontender, pos BS Central nervous system: No seizures, no tremors Extremities: No cyanosis, no joint deformities Skin: No rashes, no pallor Psychiatry: Affect normal // no auditory hallucinations    Data Reviewed: I have personally reviewed following labs and imaging studies  CBC: Recent Labs  Lab 08/27/19 1931 08/27/19 2348 08/28/19 0350 08/29/19 0446  WBC 22.0* 18.0* 17.7* 20.2*  NEUTROABS 18.9*  --   --   --   HGB 12.3 12.3 12.1 11.4*  HCT 37.9 38.9 37.9 36.6  MCV 94.0 96.3 94.3 97.1  PLT 262 238 239 263   Basic Metabolic Panel: Recent Labs  Lab 08/27/19 1817 08/27/19 2348 08/28/19 0350 08/29/19 0446  NA 134*  --  136 138  K 3.8  --  3.3* 4.0  CL 100  --  107 109  CO2 23  --  22 20*  GLUCOSE 124*  --  120* 230*  BUN 12  --  13 10  CREATININE 0.84 0.67 0.80 0.57  CALCIUM 9.0  --  7.9* 8.0*   GFR: Estimated Creatinine Clearance: 83.5 mL/min (by C-G formula based on SCr of 0.57 mg/dL). Liver Function Tests: Recent Labs  Lab 08/27/19 1817 08/29/19 0446  AST 19 18  ALT 14 15  ALKPHOS 59 46  BILITOT 0.6 0.3  PROT 7.2 5.6*  ALBUMIN 3.8 2.7*   No results for input(s): LIPASE, AMYLASE in the last 168 hours. No results for input(s): AMMONIA in the last 168 hours. Coagulation Profile: Recent Labs  Lab 08/27/19 1931  INR 1.3*   Cardiac Enzymes: No results for input(s): CKTOTAL, CKMB, CKMBINDEX, TROPONINI in the last 168 hours. BNP (last 3 results) No results for input(s): PROBNP in the last 8760 hours. HbA1C: No results for input(s): HGBA1C in the last 72 hours. CBG: No results for input(s): GLUCAP in the last 168 hours. Lipid Profile: No results for input(s): CHOL, HDL, LDLCALC, TRIG, CHOLHDL, LDLDIRECT in the last 72 hours. Thyroid Function Tests: No results for input(s): TSH, T4TOTAL,  FREET4, T3FREE, THYROIDAB in the last 72 hours. Anemia Panel: No results for input(s): VITAMINB12, FOLATE, FERRITIN, TIBC, IRON, RETICCTPCT in the last 72 hours. Sepsis Labs: Recent Labs  Lab 08/27/19 1817 08/27/19 2348 08/28/19 0519  PROCALCITON  --  5.87  --   LATICACIDVEN 1.6 1.2 1.5    Recent Results (from the past 240 hour(s))  SARS Coronavirus 2 [facility] order, Performed in [facility] hospital lab) Nasopharyngeal Nasopharyngeal Swab     Status: None   Collection Time: 08/27/19  6:17 PM   Specimen: Nasopharyngeal Swab  Result Value Ref Range Status   SARS Coronavirus 2 NEGATIVE NEGATIVE Final    Comment: (NOTE) If result is NEGATIVE  SARS-CoV-2 target nucleic acids are NOT DETECTED. The SARS-CoV-2 RNA is generally detectable in upper and lower  respiratory specimens during the acute phase of infection. The lowest  concentration of SARS-CoV-2 viral copies this assay can detect is 250  copies / mL. A negative result does not preclude SARS-CoV-2 infection  and should not be used as the sole basis for treatment or other  patient management decisions.  A negative result may occur with  improper specimen collection / handling, submission of specimen other  than nasopharyngeal swab, presence of viral mutation(s) within the  areas targeted by this assay, and inadequate number of viral copies  (<250 copies / mL). A negative result must be combined with clinical  observations, patient history, and epidemiological information. If result is POSITIVE SARS-CoV-2 target nucleic acids are DETECTED. The SARS-CoV-2 RNA is generally detectable in upper and lower  respiratory specimens dur ing the acute phase of infection.  Positive  results are indicative of active infection with SARS-CoV-2.  Clinical  correlation with patient history and other diagnostic information is  necessary to determine patient infection status.  Positive results do  not rule out bacterial infection or co-infection  with other viruses. If result is PRESUMPTIVE POSTIVE SARS-CoV-2 nucleic acids MAY BE PRESENT.   A presumptive positive result was obtained on the submitted specimen  and confirmed on repeat testing.  While 2019 novel coronavirus  (SARS-CoV-2) nucleic acids may be present in the submitted sample  additional confirmatory testing may be necessary for epidemiological  and / or clinical management purposes  to differentiate between  SARS-CoV-2 and other Sarbecovirus currently known to infect humans.  If clinically indicated additional testing with an alternate test  methodology [phone]) is advised. The SARS-CoV-2 RNA is generally  detectable in upper and lower respiratory sp ecimens during the acute  phase of infection. The expected result is Negative. Fact Sheet for Patients:  StrictlyIdeas.no Fact Sheet for Healthcare Providers: BankingDealers.co.za This test is not yet approved or cleared by the [place] FDA and has been authorized for detection and/or diagnosis of SARS-CoV-2 by FDA under an Emergency Use Authorization (EUA).  This EUA will remain in effect (meaning this test can be used) for the duration of the COVID-19 declaration under Section 564(b)(1) of the Act, 21 U.S.C. section 360bbb-3(b)(1), unless the authorization is terminated or revoked sooner. Performed at [facility], [place] [address] [place], [place] [address]   Blood Culture (routine x 2)     Status: None (Preliminary result)   Collection Time: 08/27/19  7:30 PM   Specimen: BLOOD  Result Value Ref Range Status   Specimen Description   Final    BLOOD BLOOD LEFT HAND Performed at [facility] [address] [place], [place] [address]    Special Requests   Final    BOTTLES DRAWN AEROBIC AND ANAEROBIC Blood Culture results may not be optimal due to an inadequate volume of blood received in culture bottles Performed at [facility] [address] [place], [place] [address]    Culture   Final    NO GROWTH 3 DAYS Performed at [facility], [place] [address], [place], [place] [address]    Report Status PENDING  Incomplete  SARS Coronavirus 2 [facility] order, Performed in [facility] hospital lab) Nasopharyngeal Nasopharyngeal Swab     Status: None   Collection Time: 08/27/19  9:23 PM   Specimen: Nasopharyngeal Swab  Result Value Ref Range Status  SARS Coronavirus 2 NEGATIVE NEGATIVE Final    Comment: (NOTE) If result is NEGATIVE SARS-CoV-2 target nucleic acids are NOT DETECTED. The SARS-CoV-2 RNA is generally detectable in upper and lower  respiratory specimens during the acute phase of infection. The lowest  concentration of SARS-CoV-2 viral copies this assay can detect is 250  copies / mL. A negative result does not preclude SARS-CoV-2 infection  and should not be used as the sole basis for treatment or other  patient management decisions.  A negative result may occur with  improper specimen collection / handling, submission of specimen other  than nasopharyngeal swab, presence of viral mutation(s) within the  areas targeted by this assay, and inadequate number of viral copies  (<250 copies / mL). A negative result must be combined with clinical  observations, patient history, and epidemiological information. If result is POSITIVE SARS-CoV-2 target nucleic acids are DETECTED. The SARS-CoV-2 RNA is generally detectable in upper and lower  respiratory specimens dur ing the acute phase of infection.  Positive  results are indicative of active infection with SARS-CoV-2.  Clinical  correlation with patient history and other diagnostic information is  necessary to determine patient infection status.  Positive results do  not rule out bacterial infection or co-infection with other viruses. If result is PRESUMPTIVE POSTIVE SARS-CoV-2 nucleic acids MAY BE PRESENT.   A presumptive  positive result was obtained on the submitted specimen  and confirmed on repeat testing.  While 2019 novel coronavirus  (SARS-CoV-2) nucleic acids may be present in the submitted sample  additional confirmatory testing may be necessary for epidemiological  and / or clinical management purposes  to differentiate between  SARS-CoV-2 and other Sarbecovirus currently known to infect humans.  If clinically indicated additional testing with an alternate test  methodology [phone]) is advised. The SARS-CoV-2 RNA is generally  detectable in upper and lower respiratory sp ecimens during the acute  phase of infection. The expected result is Negative. Fact Sheet for Patients:  BoilerBrush.com.cy Fact Sheet for Healthcare Providers: https://pope.com/ This test is not yet approved or cleared by the [place] FDA and has been authorized for detection and/or diagnosis of SARS-CoV-2 by FDA under an Emergency Use Authorization (EUA).  This EUA will remain in effect (meaning this test can be used) for the duration of the COVID-19 declaration under Section 564(b)(1) of the Act, 21 U.S.C. section 360bbb-3(b)(1), unless the authorization is terminated or revoked sooner. Performed at [facility], [address] [place], [place] [address]   Urine culture     Status: None   Collection Time: 08/27/19 10:00 PM   Specimen: In/Out Cath Urine  Result Value Ref Range Status   Specimen Description   Final    IN/OUT CATH URINE Performed at [facility], [address] [place], [place] [address]    Special Requests   Final    NONE Performed at [facility], [address], [place], [place] [address]    Culture   Final    NO GROWTH Performed at [facility], [address] [place], [place] [address]    Report Status 08/28/2019 FINAL  Final  Blood Culture (routine x 2)     Status: None  (Preliminary result)   Collection Time: 08/27/19 11:48 PM   Specimen: BLOOD  Result Value Ref Range Status   Specimen Description   Final    BLOOD LEFT WRIST Performed at [facility], [address] [place],  [place] [address]    Special Requests   Final    BOTTLES DRAWN AEROBIC ONLY Blood Culture adequate volume Performed at [facility], [address] [place], [place] [address]    Culture   Final    NO GROWTH 2 DAYS Performed at [facility], [address] [place], [place] [address]    Report Status PENDING  Incomplete  Expectorated sputum assessment w rflx to resp cult     Status: None   Collection Time: 08/28/19  9:27 AM   Specimen: Sputum  Result Value Ref Range Status   Specimen Description SPUTUM  Final   Special Requests NONE  Final   Sputum evaluation   Final    Sputum specimen not acceptable for testing.  Please recollect.   INFORMED DANA @0945  ON 9.23.2020 BY [facility] Performed at [facility], [address], [place], [place]    Report Status 08/28/2019 FINAL  Final  Acid Fast Smear (AFB)     Status: None   Collection Time: 08/28/19  2:47 PM   Specimen: Sputum  Result Value Ref Range Status   AFB Specimen Processing Concentration  Final   Acid Fast Smear Negative  Final    Comment: (NOTE) Performed At: [facility] [address] [place], [place] [id number] MD Jolene Schimke    Source (AFB) SPUTUM  Final    Comment: Performed at [facility], [address], [place], [place]     Radiology Studies: No results found.  Scheduled Meds: . azithromycin  500 mg Oral q1800  . enoxaparin (LOVENOX) injection  40 mg Subcutaneous QHS  . folic acid  1 mg Oral Daily  . gabapentin  300 mg Oral QHS  . ipratropium  0.5 mg Nebulization Q6H WA  . levalbuterol  0.63 mg Nebulization Q6H WA  . methylPREDNISolone (SOLU-MEDROL) injection  40 mg  Intravenous Q12H  . mometasone-formoterol  2 puff Inhalation BID  . potassium chloride  20 mEq Oral BID  . vitamin B-12  500 mcg Oral Daily   Continuous Infusions: . cefTRIAXone (ROCEPHIN)  IV 2 g (08/30/19 1026)     LOS: 3 days   09/01/19, MD [facility] Pager On Amion  If 7PM-7AM, please contact night-coverage 08/30/2019, 4:44 PM

## 2019-08-31 LAB — CBC
HCT: 32.8 % — ABNORMAL LOW (ref 36.0–46.0)
Hemoglobin: 10.5 g/dL — ABNORMAL LOW (ref 12.0–15.0)
MCH: 30.8 pg (ref 26.0–34.0)
MCHC: 32 g/dL (ref 30.0–36.0)
MCV: 96.2 fL (ref 80.0–100.0)
Platelets: 371 10*3/uL (ref 150–400)
RBC: 3.41 MIL/uL — ABNORMAL LOW (ref 3.87–5.11)
RDW: 16.2 % — ABNORMAL HIGH (ref 11.5–15.5)
WBC: 16.3 10*3/uL — ABNORMAL HIGH (ref 4.0–10.5)
nRBC: 0 % (ref 0.0–0.2)

## 2019-08-31 LAB — COMPREHENSIVE METABOLIC PANEL
ALT: 30 U/L (ref 0–44)
AST: 28 U/L (ref 15–41)
Albumin: 2.4 g/dL — ABNORMAL LOW (ref 3.5–5.0)
Alkaline Phosphatase: 60 U/L (ref 38–126)
Anion gap: 9 (ref 5–15)
BUN: 17 mg/dL (ref 6–20)
CO2: 23 mmol/L (ref 22–32)
Calcium: 8.4 mg/dL — ABNORMAL LOW (ref 8.9–10.3)
Chloride: 107 mmol/L (ref 98–111)
Creatinine, Ser: 0.46 mg/dL (ref 0.44–1.00)
GFR calc Af Amer: >60 mL/min (ref >60)
GFR calc non Af Amer: >60 mL/min (ref >60)
Glucose, Bld: 174 mg/dL — ABNORMAL HIGH (ref 70–99)
Potassium: 4.5 mmol/L (ref 3.5–5.1)
Sodium: 139 mmol/L (ref 135–145)
Total Bilirubin: 0.2 mg/dL — ABNORMAL LOW (ref 0.3–1.2)
Total Protein: 5.4 g/dL — ABNORMAL LOW (ref 6.5–8.1)

## 2019-08-31 NOTE — Progress Notes (Signed)
PROGRESS NOTE    Elizabeth Villarreal  RAX:[id number] DOB: March 11, 1971 DOA: 08/27/2019 PCP: Marcine Matar, MD    Brief Narrative:  48 y.o. female with history of COPD previous tobacco abuse recently placed on gabapentin for neuropathy which patient has not taken presents to the ER with complaint of fever chills.  Patient states her symptoms started this morning and has not had any productive cough until patient reached ER.  Has not had any recent travel but has been living in the shelter for last 1 month.  Denies any caries in the teeth.  Denies drinking alcohol nausea vomiting diarrhea abdominal pain.  Has been examined right-sided pleuritic chest pain.  ED Course: In the ER patient was tachycardic with fever of 103 F lab work show WBC count of 22,000 lactic acid of 1.6 hemoglobin 12 creatinine 0.8 chest x-ray shows right upper lobe infiltrates concerning for pneumonia.  Blood cultures obtained and started on empiric antibiotics for community-acquired pneumonia COVID-19 test was negative initially.  Repeat one has been ordered which is pending.  Patient admitted for sepsis secondary pneumonia.  Assessment & Plan:   Principal Problem:   Sepsis (HCC) Active Problems:   COPD GOLD D  with chronic bronchitis (HCC)   Mild depression (HCC)   CAP (community acquired pneumonia)   1. Sepsis secondary to community-acquired pneumonia 1. COVID neg 2. patient has been continued on ceftriaxone and Zithromax follow sputum cultures blood cultures 3. Recheck CBC in AM 4.  As pt lives at shelter, concern for risk of TB exposure. QuantiFERON gold test is indeterminate. AFB is neg x 1, will repeat AFB today. As pt is showing much improvement with current tx, doubt pt has TB 5. Tmax of 103.71F on 9/22. Fevers noted to be resolving 2. COPD exacerbation 1. Air movement continues to improve with steroids and nebs 2. COVID is neg 3. Will continue IV solumedrol BID 4. O2 weaned to [facility] this AM 3. Neuropathy. 1.  Have started pt on neurontin QHS 2. Seems to be stable 4. Tobacco abuse quit many years ago. 1. Pt congratulated earlier 2. Declines nicotine patch   DVT prophylaxis: Lovenox subQ Code Status: Full Family Communication: Pt in room, family not at bedside Disposition Plan: Uncertain at this time  Consultants:     Procedures:     Antimicrobials: Anti-infectives (From admission, onward)   Start     Dose/Rate Route Frequency Ordered Stop   08/30/19 1800  azithromycin (ZITHROMAX) tablet 500 mg     500 mg Oral Daily-1800 08/30/19 1046 09/02/19 1759   08/28/19 1600  azithromycin (ZITHROMAX) 500 mg in sodium chloride 0.9 % 250 mL IVPB  Status:  Discontinued     500 mg 250 mL/hr over 60 Minutes Intravenous Every 24 hours 08/27/19 2129 08/30/19 1046   08/28/19 1000  cefTRIAXone (ROCEPHIN) 2 g in sodium chloride 0.9 % 100 mL IVPB     2 g 200 mL/hr over 30 Minutes Intravenous Every 24 hours 08/27/19 2129 09/02/19 0959   08/27/19 1915  cefTRIAXone (ROCEPHIN) 1 g in sodium chloride 0.9 % 100 mL IVPB     1 g 200 mL/hr over 30 Minutes Intravenous  Once 08/27/19 1910 08/27/19 2148   08/27/19 1915  azithromycin (ZITHROMAX) tablet 500 mg     500 mg Oral  Once 08/27/19 1910 08/27/19 1934      Subjective: Reports feeling and breathing much better today  Objective: Vitals:   08/31/19 1115 08/31/19 1125 08/31/19 1334 08/31/19 1459  BP:  131/82  Pulse:   65 86  Resp:   18 17  Temp:    98.1 F (36.7 C)  TempSrc:    Oral  SpO2: 97% 95% 98% 96%  Weight:      Height:        Intake/Output Summary (Last 24 hours) at 08/31/2019 1516 Last data filed at 08/31/2019 0600 Gross per 24 hour  Intake 530 ml  Output -  Net 530 ml   Filed Weights   08/27/19 1814 08/27/19 2340  Weight: 63.5 kg 61.5 kg    Examination: General exam: Awake, laying in bed, in nad Respiratory system: Normal respiratory effort, no wheezing Cardiovascular system: regular rate, s1, s2 Gastrointestinal system:  Soft, nondistended, positive BS Central nervous system: CN2-12 grossly intact, strength intact Extremities: Perfused, no clubbing Skin: Normal skin turgor, no notable skin lesions seen Psychiatry: Mood normal // no visual hallucinations   Data Reviewed: I have personally reviewed following labs and imaging studies  CBC: Recent Labs  Lab 08/27/19 1931 08/27/19 2348 08/28/19 0350 08/29/19 0446 08/31/19 0429  WBC 22.0* 18.0* 17.7* 20.2* 16.3*  NEUTROABS 18.9*  --   --   --   --   HGB 12.3 12.3 12.1 11.4* 10.5*  HCT 37.9 38.9 37.9 36.6 32.8*  MCV 94.0 96.3 94.3 97.1 96.2  PLT 262 238 239 263 371   Basic Metabolic Panel: Recent Labs  Lab 08/27/19 1817 08/27/19 2348 08/28/19 0350 08/29/19 0446 08/31/19 0429  NA 134*  --  136 138 139  K 3.8  --  3.3* 4.0 4.5  CL 100  --  107 109 107  CO2 23  --  22 20* 23  GLUCOSE 124*  --  120* 230* 174*  BUN 12  --  CREATININE 0.84 0.67 0.80 0.57 0.46  CALCIUM 9.0  --  7.9* 8.0* 8.4*   GFR: Estimated Creatinine Clearance: 83.5 mL/min (by C-G formula based on SCr of 0.46 mg/dL). Liver Function Tests: Recent Labs  Lab 08/27/19 1817 08/29/19 0446 08/31/19 0429  AST ALT ALKPHOS 59 46 60  BILITOT 0.6 0.3 0.2*  PROT 7.2 5.6* 5.4*  ALBUMIN 3.8 2.7* 2.4*   No results for input(s): LIPASE, AMYLASE in the last 168 hours. No results for input(s): AMMONIA in the last 168 hours. Coagulation Profile: Recent Labs  Lab 08/27/19 1931  INR 1.3*   Cardiac Enzymes: No results for input(s): CKTOTAL, CKMB, CKMBINDEX, TROPONINI in the last 168 hours. BNP (last 3 results) No results for input(s): PROBNP in the last 8760 hours. HbA1C: No results for input(s): HGBA1C in the last 72 hours. CBG: No results for input(s): GLUCAP in the last 168 hours. Lipid Profile: No results for input(s): CHOL, HDL, LDLCALC, TRIG, CHOLHDL, LDLDIRECT in the last 72 hours. Thyroid Function Tests: No results for input(s): TSH,  T4TOTAL, FREET4, T3FREE, THYROIDAB in the last 72 hours. Anemia Panel: No results for input(s): VITAMINB12, FOLATE, FERRITIN, TIBC, IRON, RETICCTPCT in the last 72 hours. Sepsis Labs: Recent Labs  Lab 08/27/19 1817 08/27/19 2348 08/28/19 0519  PROCALCITON  --  5.87  --   LATICACIDVEN 1.6 1.2 1.5    Recent Results (from the past 240 hour(s))  SARS Coronavirus 2 [facility] order, Performed in [facility] hospital lab) Nasopharyngeal Nasopharyngeal Swab     Status: None   Collection Time: 08/27/19  6:17 PM   Specimen: Nasopharyngeal Swab  Result Value Ref Range Status   SARS  Coronavirus 2 NEGATIVE NEGATIVE Final    Comment: (NOTE) If result is NEGATIVE SARS-CoV-2 target nucleic acids are NOT DETECTED. The SARS-CoV-2 RNA is generally detectable in upper and lower  respiratory specimens during the acute phase of infection. The lowest  concentration of SARS-CoV-2 viral copies this assay can detect is 250  copies / mL. A negative result does not preclude SARS-CoV-2 infection  and should not be used as the sole basis for treatment or other  patient management decisions.  A negative result may occur with  improper specimen collection / handling, submission of specimen other  than nasopharyngeal swab, presence of viral mutation(s) within the  areas targeted by this assay, and inadequate number of viral copies  (<250 copies / mL). A negative result must be combined with clinical  observations, patient history, and epidemiological information. If result is POSITIVE SARS-CoV-2 target nucleic acids are DETECTED. The SARS-CoV-2 RNA is generally detectable in upper and lower  respiratory specimens dur ing the acute phase of infection.  Positive  results are indicative of active infection with SARS-CoV-2.  Clinical  correlation with patient history and other diagnostic information is  necessary to determine patient infection status.  Positive results do  not rule out bacterial infection or  co-infection with other viruses. If result is PRESUMPTIVE POSTIVE SARS-CoV-2 nucleic acids MAY BE PRESENT.   A presumptive positive result was obtained on the submitted specimen  and confirmed on repeat testing.  While 2019 novel coronavirus  (SARS-CoV-2) nucleic acids may be present in the submitted sample  additional confirmatory testing may be necessary for epidemiological  and / or clinical management purposes  to differentiate between  SARS-CoV-2 and other Sarbecovirus currently known to infect humans.  If clinically indicated additional testing with an alternate test  methodology [phone]) is advised. The SARS-CoV-2 RNA is generally  detectable in upper and lower respiratory sp ecimens during the acute  phase of infection. The expected result is Negative. Fact Sheet for Patients:  BoilerBrush.com.cyhttps://www.fda.gov/media/136312/download Fact Sheet for Healthcare Providers: https://pope.com/https://www.fda.gov/media/136313/download This test is not yet approved or cleared by the [place] FDA and has been authorized for detection and/or diagnosis of SARS-CoV-2 by FDA under an Emergency Use Authorization (EUA).  This EUA will remain in effect (meaning this test can be used) for the duration of the COVID-19 declaration under Section 564(b)(1) of the Act, 21 U.S.C. section 360bbb-3(b)(1), unless the authorization is terminated or revoked sooner. Performed at [facility], [address] [place], [place] [address]   Blood Culture (routine x 2)     Status: None (Preliminary result)   Collection Time: 08/27/19  7:30 PM   Specimen: BLOOD  Result Value Ref Range Status   Specimen Description   Final    BLOOD BLOOD LEFT HAND Performed at [facility], [address] [place], [place] [address]    Special Requests   Final    BOTTLES DRAWN AEROBIC AND ANAEROBIC Blood Culture results may not be optimal due to an inadequate volume of blood received in culture bottles  Performed at [facility], [address] [place], [place] [address]    Culture   Final    NO GROWTH 4 DAYS Performed at [facility], [address] [place], [place] [address]    Report Status PENDING  Incomplete  SARS Coronavirus 2 [facility](Hospital order, Performed in [facility] hospital lab) Nasopharyngeal Nasopharyngeal Swab     Status: None   Collection Time: 08/27/19  9:23  PM   Specimen: Nasopharyngeal Swab  Result Value Ref Range Status   SARS Coronavirus 2 NEGATIVE NEGATIVE Final    Comment: (NOTE) If result is NEGATIVE SARS-CoV-2 target nucleic acids are NOT DETECTED. The SARS-CoV-2 RNA is generally detectable in upper and lower  respiratory specimens during the acute phase of infection. The lowest  concentration of SARS-CoV-2 viral copies this assay can detect is 250  copies / mL. A negative result does not preclude SARS-CoV-2 infection  and should not be used as the sole basis for treatment or other  patient management decisions.  A negative result may occur with  improper specimen collection / handling, submission of specimen other  than nasopharyngeal swab, presence of viral mutation(s) within the  areas targeted by this assay, and inadequate number of viral copies  (<250 copies / mL). A negative result must be combined with clinical  observations, patient history, and epidemiological information. If result is POSITIVE SARS-CoV-2 target nucleic acids are DETECTED. The SARS-CoV-2 RNA is generally detectable in upper and lower  respiratory specimens dur ing the acute phase of infection.  Positive  results are indicative of active infection with SARS-CoV-2.  Clinical  correlation with patient history and other diagnostic information is  necessary to determine patient infection status.  Positive results do  not rule out bacterial infection or co-infection with other viruses. If result is PRESUMPTIVE POSTIVE SARS-CoV-2 nucleic acids MAY BE PRESENT.    A presumptive positive result was obtained on the submitted specimen  and confirmed on repeat testing.  While 2019 novel coronavirus  (SARS-CoV-2) nucleic acids may be present in the submitted sample  additional confirmatory testing may be necessary for epidemiological  and / or clinical management purposes  to differentiate between  SARS-CoV-2 and other Sarbecovirus currently known to infect humans.  If clinically indicated additional testing with an alternate test  methodology [phone]) is advised. The SARS-CoV-2 RNA is generally  detectable in upper and lower respiratory sp ecimens during the acute  phase of infection. The expected result is Negative. Fact Sheet for Patients:  StrictlyIdeas.no Fact Sheet for Healthcare Providers: BankingDealers.co.za This test is not yet approved or cleared by the [place] FDA and has been authorized for detection and/or diagnosis of SARS-CoV-2 by FDA under an Emergency Use Authorization (EUA).  This EUA will remain in effect (meaning this test can be used) for the duration of the COVID-19 declaration under Section 564(b)(1) of the Act, 21 U.S.C. section 360bbb-3(b)(1), unless the authorization is terminated or revoked sooner. Performed at [facility], [place] [address], [place], [place] [address]   Urine culture     Status: None   Collection Time: 08/27/19 10:00 PM   Specimen: In/Out Cath Urine  Result Value Ref Range Status   Specimen Description   Final    IN/OUT CATH URINE Performed at [place] [address] [place], [place] [address]    Special Requests   Final    NONE Performed at [facility], [place] [address], [place], [place] [address]    Culture   Final    NO GROWTH Performed at [facility], [place] [address], [place], [place] [address]    Report Status 08/28/2019 FINAL  Final  Blood Culture (routine x 2)      Status: None (Preliminary result)   Collection Time: 08/27/19 11:48 PM   Specimen: BLOOD  Result Value Ref Range Status   Specimen Description   Final  BLOOD LEFT WRIST Performed at [facility], [address] [place], [place] [address]    Special Requests   Final    BOTTLES DRAWN AEROBIC ONLY Blood Culture adequate volume Performed at [facility] [address] [place], [place] [address]    Culture   Final    NO GROWTH 3 DAYS Performed at [facility], [address], [place], [place] [address]    Report Status PENDING  Incomplete  Expectorated sputum assessment w rflx to resp cult     Status: None   Collection Time: 08/28/19  9:27 AM   Specimen: Sputum  Result Value Ref Range Status   Specimen Description SPUTUM  Final   Special Requests NONE  Final   Sputum evaluation   Final    Sputum specimen not acceptable for testing.  Please recollect.   INFORMED DANA  ON 9.23.2020 BY [facility] Performed at [facility], [address], [place], [place] [address]    Report Status 08/28/2019 FINAL  Final  Acid Fast Smear (AFB)     Status: None   Collection Time: 08/28/19  2:47 PM   Specimen: Sputum  Result Value Ref Range Status   AFB Specimen Processing Concentration  Final   Acid Fast Smear Negative  Final    Comment: (NOTE) Performed At: [facility] [address] [place], [place] [id number] Jolene Schimke MD IO:[id number]    Source (AFB) SPUTUM  Final    Comment: Performed at [facility], [address], [place], [place] [address]     Radiology Studies: No results found.  Scheduled Meds: . azithromycin  500 mg Oral q1800  . enoxaparin (LOVENOX) injection  40 mg Subcutaneous QHS  . folic acid  1 mg Oral Daily  . gabapentin  300 mg Oral QHS  . ipratropium  0.5 mg Nebulization Q6H WA  . levalbuterol  0.63 mg Nebulization Q6H WA  . methylPREDNISolone (SOLU-MEDROL) injection   40 mg Intravenous Q12H  . mometasone-formoterol  2 puff Inhalation BID  . potassium chloride  20 mEq Oral BID  . vitamin B-12  500 mcg Oral Daily   Continuous Infusions: . cefTRIAXone (ROCEPHIN)  IV 2 g (08/31/19 1110)     LOS: 4 days   Rickey Barbara, MD [facility] Pager On Amion  If 7PM-7AM, please contact night-coverage 08/31/2019, 3:16 PM

## 2019-09-01 LAB — CULTURE, BLOOD (ROUTINE X 2): Culture: NO GROWTH

## 2019-09-01 MED ORDER — METHYLPREDNISOLONE SODIUM SUCC 40 MG IJ SOLR
40.0000 mg | INTRAMUSCULAR | Status: DC
  Administered 2019-09-02: 40 mg via INTRAVENOUS
  Filled 2019-09-01 (×2): qty 1

## 2019-09-01 NOTE — Progress Notes (Signed)
PROGRESS NOTE    Elizabeth Villarreal  JTT:[id number] DOB: 10-May-1971 DOA: 08/27/2019 PCP: Ladell Pier, MD    Brief Narrative:  48 y.o. female with history of COPD previous tobacco abuse recently placed on gabapentin for neuropathy which patient has not taken presents to the ER with complaint of fever chills.  Patient states her symptoms started this morning and has not had any productive cough until patient reached ER.  Has not had any recent travel but has been living in the shelter for last 1 month.  Denies any caries in the teeth.  Denies drinking alcohol nausea vomiting diarrhea abdominal pain.  Has been examined right-sided pleuritic chest pain.  ED Course: In the ER patient was tachycardic with fever of 103 F lab work show WBC count of 22,000 lactic acid of 1.6 hemoglobin 12 creatinine 0.8 chest x-ray shows right upper lobe infiltrates concerning for pneumonia.  Blood cultures obtained and started on empiric antibiotics for community-acquired pneumonia COVID-19 test was negative initially.  Repeat one has been ordered which is pending.  Patient admitted for sepsis secondary pneumonia.  Assessment & Plan:   Principal Problem:   Sepsis ([place]) Active Problems:   COPD GOLD D  with chronic bronchitis ([place])   Mild depression ([place])   CAP (community acquired pneumonia)   1. Sepsis secondary to community-acquired pneumonia 1. COVID neg 2. patient has been continued on ceftriaxone and Zithromax follow sputum cultures blood cultures 3. Recheck CBC in AM 4.  As pt lives at shelter, concern for risk of TB exposure. QuantiFERON gold test is indeterminate. AFB is neg x 1, will repeat AFB today. As pt is showing much improvement with current tx, doubt pt has TB 5. Tmax of 103.74F on 9/22. Now afebrile 2. COPD exacerbation 1. Air movement much improved with steroids and nebs 2. COVID is neg 3. Cont neb tx. Will begin steroid taper 4. O2 weaned room air 3. Neuropathy. 1. Have started pt on  neurontin QHS 2. Seems to be stable at this time 4. Tobacco abuse quit many years ago. 1. Pt congratulated earlier 2. Declines nicotine patch   DVT prophylaxis: Lovenox subQ Code Status: Full Family Communication: Pt in room, family not at bedside Disposition Plan: Uncertain at this time  Consultants:     Procedures:     Antimicrobials: Anti-infectives (From admission, onward)   Start     Dose/Rate Route Frequency Ordered Stop   08/30/19 1800  azithromycin (ZITHROMAX) tablet 500 mg     500 mg Oral Daily-1800 08/30/19 1046 09/02/19 1759   08/28/19 1600  azithromycin (ZITHROMAX) 500 mg in sodium chloride 0.9 % 250 mL IVPB  Status:  Discontinued     500 mg 250 mL/hr over 60 Minutes Intravenous Every 24 hours 08/27/19 2129 08/30/19 1046   08/28/19 1000  cefTRIAXone (ROCEPHIN) 2 g in sodium chloride 0.9 % 100 mL IVPB     2 g 200 mL/hr over 30 Minutes Intravenous Every 24 hours 08/27/19 2129 09/01/19 1022   08/27/19 1915  cefTRIAXone (ROCEPHIN) 1 g in sodium chloride 0.9 % 100 mL IVPB     1 g 200 mL/hr over 30 Minutes Intravenous  Once 08/27/19 1910 08/27/19 2148   08/27/19 1915  azithromycin (ZITHROMAX) tablet 500 mg     500 mg Oral  Once 08/27/19 1910 08/27/19 1934      Subjective: Reports feeling much better  Objective: Vitals:   09/01/19 0823 09/01/19 0826 09/01/19 1312 09/01/19 1349  BP:    121/83  Pulse:    75  Resp:    16  Temp:    98.1 F (36.7 C)  TempSrc:    Oral  SpO2: 95% 95% 95% 95%  Weight:      Height:        Intake/Output Summary (Last 24 hours) at 09/01/2019 1549 Last data filed at 09/01/2019 0600 Gross per 24 hour  Intake 1080 ml  Output -  Net 1080 ml   Filed Weights   08/27/19 1814 08/27/19 2340 09/01/19 0327  Weight: 63.5 kg 61.5 kg 66.6 kg    Examination: General exam: Conversant, in no acute distress Respiratory system: normal chest rise, clear, no audible wheezing Cardiovascular system: regular rhythm, s1-s2 Gastrointestinal  system: Nondistended, nontender, pos BS Central nervous system: No seizures, no tremors Extremities: No cyanosis, no joint deformities Skin: No rashes, no pallor Psychiatry: Affect normal // no auditory hallucinations   Data Reviewed: I have personally reviewed following labs and imaging studies  CBC: Recent Labs  Lab 08/27/19 1931 08/27/19 2348 08/28/19 0350 08/29/19 0446 08/31/19 0429  WBC 22.0* 18.0* 17.7* 20.2* 16.3*  NEUTROABS 18.9*  --   --   --   --   HGB 12.3 12.3 12.1 11.4* 10.5*  HCT 37.9 38.9 37.9 36.6 32.8*  MCV 94.0 96.3 94.3 97.1 96.2  PLT 262 238 239 263 371   Basic Metabolic Panel: Recent Labs  Lab 08/27/19 1817 08/27/19 2348 08/28/19 0350 08/29/19 0446 08/31/19 0429  NA 134*  --  136 138 139  K 3.8  --  3.3* 4.0 4.5  CL 100  --  107 109 107  CO2 23  --  22 20* 23  GLUCOSE 124*  --  120* 230* 174*  BUN 12  --  CREATININE 0.84 0.67 0.80 0.57 0.46  CALCIUM 9.0  --  7.9* 8.0* 8.4*   GFR: Estimated Creatinine Clearance: 86.8 mL/min (by C-G formula based on SCr of 0.46 mg/dL). Liver Function Tests: Recent Labs  Lab 08/27/19 1817 08/29/19 0446 08/31/19 0429  AST ALT ALKPHOS 59 46 60  BILITOT 0.6 0.3 0.2*  PROT 7.2 5.6* 5.4*  ALBUMIN 3.8 2.7* 2.4*   No results for input(s): LIPASE, AMYLASE in the last 168 hours. No results for input(s): AMMONIA in the last 168 hours. Coagulation Profile: Recent Labs  Lab 08/27/19 1931  INR 1.3*   Cardiac Enzymes: No results for input(s): CKTOTAL, CKMB, CKMBINDEX, TROPONINI in the last 168 hours. BNP (last 3 results) No results for input(s): PROBNP in the last 8760 hours. HbA1C: No results for input(s): HGBA1C in the last 72 hours. CBG: No results for input(s): GLUCAP in the last 168 hours. Lipid Profile: No results for input(s): CHOL, HDL, LDLCALC, TRIG, CHOLHDL, LDLDIRECT in the last 72 hours. Thyroid Function Tests: No results for input(s): TSH, T4TOTAL, FREET4, T3FREE,  THYROIDAB in the last 72 hours. Anemia Panel: No results for input(s): VITAMINB12, FOLATE, FERRITIN, TIBC, IRON, RETICCTPCT in the last 72 hours. Sepsis Labs: Recent Labs  Lab 08/27/19 1817 08/27/19 2348 08/28/19 0519  PROCALCITON  --  5.87  --   LATICACIDVEN 1.6 1.2 1.5    Recent Results (from the past 240 hour(s))  SARS Coronavirus 2 [facility] order, Performed in [facility] hospital lab) Nasopharyngeal Nasopharyngeal Swab     Status: None   Collection Time: 08/27/19  6:17 PM   Specimen: Nasopharyngeal Swab  Result Value Ref Range Status   SARS Coronavirus  2 NEGATIVE NEGATIVE Final    Comment: (NOTE) If result is NEGATIVE SARS-CoV-2 target nucleic acids are NOT DETECTED. The SARS-CoV-2 RNA is generally detectable in upper and lower  respiratory specimens during the acute phase of infection. The lowest  concentration of SARS-CoV-2 viral copies this assay can detect is 250  copies / mL. A negative result does not preclude SARS-CoV-2 infection  and should not be used as the sole basis for treatment or other  patient management decisions.  A negative result may occur with  improper specimen collection / handling, submission of specimen other  than nasopharyngeal swab, presence of viral mutation(s) within the  areas targeted by this assay, and inadequate number of viral copies  (<250 copies / mL). A negative result must be combined with clinical  observations, patient history, and epidemiological information. If result is POSITIVE SARS-CoV-2 target nucleic acids are DETECTED. The SARS-CoV-2 RNA is generally detectable in upper and lower  respiratory specimens dur ing the acute phase of infection.  Positive  results are indicative of active infection with SARS-CoV-2.  Clinical  correlation with patient history and other diagnostic information is  necessary to determine patient infection status.  Positive results do  not rule out bacterial infection or co-infection with other  viruses. If result is PRESUMPTIVE POSTIVE SARS-CoV-2 nucleic acids MAY BE PRESENT.   A presumptive positive result was obtained on the submitted specimen  and confirmed on repeat testing.  While 2019 novel coronavirus  (SARS-CoV-2) nucleic acids may be present in the submitted sample  additional confirmatory testing may be necessary for epidemiological  and / or clinical management purposes  to differentiate between  SARS-CoV-2 and other Sarbecovirus currently known to infect humans.  If clinically indicated additional testing with an alternate test  methodology [phone]) is advised. The SARS-CoV-2 RNA is generally  detectable in upper and lower respiratory sp ecimens during the acute  phase of infection. The expected result is Negative. Fact Sheet for Patients:  BoilerBrush.com.cy Fact Sheet for Healthcare Providers: https://pope.com/ This test is not yet approved or cleared by the [place] FDA and has been authorized for detection and/or diagnosis of SARS-CoV-2 by FDA under an Emergency Use Authorization (EUA).  This EUA will remain in effect (meaning this test can be used) for the duration of the COVID-19 declaration under Section 564(b)(1) of the Act, 21 U.S.C. section 360bbb-3(b)(1), unless the authorization is terminated or revoked sooner. Performed at [facility], [address] [place], [place] [address]   Blood Culture (routine x 2)     Status: None   Collection Time: 08/27/19  7:30 PM   Specimen: BLOOD  Result Value Ref Range Status   Specimen Description   Final    BLOOD BLOOD LEFT HAND Performed at [facility], [address], [place], [place] [address]    Special Requests   Final    BOTTLES DRAWN AEROBIC AND ANAEROBIC Blood Culture results may not be optimal due to an inadequate volume of blood received in culture bottles Performed at [facility], [address], [place], [place] [address]    Culture   Final    NO GROWTH 5 DAYS Performed at [facility], [address], [place], [place] [address]    Report Status 09/01/2019 FINAL  Final  SARS Coronavirus 2 [facility] order, Performed in [facility] hospital lab) Nasopharyngeal Nasopharyngeal Swab     Status: None   Collection Time: 08/27/19  9:23 PM  Specimen: Nasopharyngeal Swab  Result Value Ref Range Status   SARS Coronavirus 2 NEGATIVE NEGATIVE Final    Comment: (NOTE) If result is NEGATIVE SARS-CoV-2 target nucleic acids are NOT DETECTED. The SARS-CoV-2 RNA is generally detectable in upper and lower  respiratory specimens during the acute phase of infection. The lowest  concentration of SARS-CoV-2 viral copies this assay can detect is 250  copies / mL. A negative result does not preclude SARS-CoV-2 infection  and should not be used as the sole basis for treatment or other  patient management decisions.  A negative result may occur with  improper specimen collection / handling, submission of specimen other  than nasopharyngeal swab, presence of viral mutation(s) within the  areas targeted by this assay, and inadequate number of viral copies  (<250 copies / mL). A negative result must be combined with clinical  observations, patient history, and epidemiological information. If result is POSITIVE SARS-CoV-2 target nucleic acids are DETECTED. The SARS-CoV-2 RNA is generally detectable in upper and lower  respiratory specimens dur ing the acute phase of infection.  Positive  results are indicative of active infection with SARS-CoV-2.  Clinical  correlation with patient history and other diagnostic information is  necessary to determine patient infection status.  Positive results do  not rule out bacterial infection or co-infection with other viruses. If result is PRESUMPTIVE POSTIVE SARS-CoV-2 nucleic acids MAY BE PRESENT.   A presumptive positive result was obtained on the  submitted specimen  and confirmed on repeat testing.  While 2019 novel coronavirus  (SARS-CoV-2) nucleic acids may be present in the submitted sample  additional confirmatory testing may be necessary for epidemiological  and / or clinical management purposes  to differentiate between  SARS-CoV-2 and other Sarbecovirus currently known to infect humans.  If clinically indicated additional testing with an alternate test  methodology [phone]) is advised. The SARS-CoV-2 RNA is generally  detectable in upper and lower respiratory sp ecimens during the acute  phase of infection. The expected result is Negative. Fact Sheet for Patients:  BoilerBrush.com.cy Fact Sheet for Healthcare Providers: https://pope.com/ This test is not yet approved or cleared by the [place] FDA and has been authorized for detection and/or diagnosis of SARS-CoV-2 by FDA under an Emergency Use Authorization (EUA).  This EUA will remain in effect (meaning this test can be used) for the duration of the COVID-19 declaration under Section 564(b)(1) of the Act, 21 U.S.C. section 360bbb-3(b)(1), unless the authorization is terminated or revoked sooner. Performed at [facility], [address], [place], [place] [address]   Urine culture     Status: None   Collection Time: 08/27/19 10:00 PM   Specimen: In/Out Cath Urine  Result Value Ref Range Status   Specimen Description   Final    IN/OUT CATH URINE Performed at [facility], [address] [place], [place] [address]    Special Requests   Final    NONE Performed at [facility], [address], [place], [place] [address]    Culture   Final    NO GROWTH Performed at [facility], [address], [place], [place] [address]    Report Status 08/28/2019 FINAL  Final  Blood Culture (routine x 2)     Status: None (Preliminary result)   Collection Time:  08/27/19 11:48 PM   Specimen: BLOOD  Result Value Ref Range Status   Specimen Description   Final    BLOOD LEFT WRIST  Performed at [facility], [address] [place], [place] [address]    Special Requests   Final    BOTTLES DRAWN AEROBIC ONLY Blood Culture adequate volume Performed at [facility], [address] [place], [place] [address]    Culture   Final    NO GROWTH 4 DAYS Performed at [facility], [address] [place], [place] [address]    Report Status PENDING  Incomplete  Expectorated sputum assessment w rflx to resp cult     Status: None   Collection Time: 08/28/19  9:27 AM   Specimen: Sputum  Result Value Ref Range Status   Specimen Description SPUTUM  Final   Special Requests NONE  Final   Sputum evaluation   Final    Sputum specimen not acceptable for testing.  Please recollect.   INFORMED DANA @0945  ON 9.23.2020 BY [facility] Performed at [facility], [address] [place], [place] [address]    Report Status 08/28/2019 FINAL  Final  Acid Fast Smear (AFB)     Status: None   Collection Time: 08/28/19  2:47 PM   Specimen: Sputum  Result Value Ref Range Status   AFB Specimen Processing Concentration  Final   Acid Fast Smear Negative  Final    Comment: (NOTE) Performed At: [facility] [address] [place], [place] [id number] Jolene SchimkeNagendra Sanjai MD KG:[id number]:[phone]    Source (AFB) SPUTUM  Final    Comment: Performed at [facility], [address] [place], [place] [address]     Radiology Studies: No results found.  Scheduled Meds: . azithromycin  500 mg Oral q1800  . enoxaparin (LOVENOX) injection  40 mg Subcutaneous QHS  . folic acid  1 mg Oral Daily  . gabapentin  300 mg Oral QHS  . ipratropium  0.5 mg Nebulization Q6H WA  . levalbuterol  0.63 mg Nebulization Q6H WA  . methylPREDNISolone (SOLU-MEDROL) injection  40 mg Intravenous Q12H  . mometasone-formoterol  2  puff Inhalation BID  . potassium chloride  20 mEq Oral BID  . vitamin B-12  500 mcg Oral Daily   Continuous Infusions:    LOS: 5 days   Rickey BarbaraStephen Carianne Taira, MD [facility] Pager On Amion  If 7PM-7AM, please contact night-coverage 09/01/2019, 3:49 PM

## 2019-09-02 ENCOUNTER — Ambulatory Visit: Payer: Self-pay | Admitting: Critical Care Medicine

## 2019-09-02 LAB — CULTURE, BLOOD (ROUTINE X 2)
Culture: NO GROWTH
Special Requests: ADEQUATE

## 2019-09-02 MED ORDER — ALBUTEROL SULFATE (2.5 MG/3ML) 0.083% IN NEBU
2.5000 mg | INHALATION_SOLUTION | Freq: Three times a day (TID) | RESPIRATORY_TRACT | Status: DC
  Administered 2019-09-02 – 2019-09-04 (×5): 2.5 mg via RESPIRATORY_TRACT
  Filled 2019-09-02 (×5): qty 3

## 2019-09-02 MED ORDER — IPRATROPIUM BROMIDE 0.02 % IN SOLN
0.5000 mg | Freq: Three times a day (TID) | RESPIRATORY_TRACT | Status: DC
  Administered 2019-09-02 – 2019-09-04 (×5): 0.5 mg via RESPIRATORY_TRACT
  Filled 2019-09-02 (×5): qty 2.5

## 2019-09-02 MED ORDER — GABAPENTIN 300 MG PO CAPS
300.0000 mg | ORAL_CAPSULE | Freq: Two times a day (BID) | ORAL | Status: DC
  Administered 2019-09-02 – 2019-09-04 (×5): 300 mg via ORAL
  Filled 2019-09-02 (×5): qty 1

## 2019-09-02 MED ORDER — ALBUTEROL SULFATE (2.5 MG/3ML) 0.083% IN NEBU: 2.5000 mg | INHALATION_SOLUTION | Freq: Two times a day (BID) | RESPIRATORY_TRACT | Status: DC

## 2019-09-02 MED ORDER — GABAPENTIN 300 MG PO CAPS: 300.0000 mg | ORAL_CAPSULE | Freq: Two times a day (BID) | ORAL | Status: DC

## 2019-09-02 MED ORDER — PREDNISONE 50 MG PO TABS
60.0000 mg | ORAL_TABLET | Freq: Every day | ORAL | Status: DC
  Administered 2019-09-03: 60 mg via ORAL

## 2019-09-02 MED ORDER — IPRATROPIUM BROMIDE 0.02 % IN SOLN: 0.5000 mg | Freq: Two times a day (BID) | RESPIRATORY_TRACT | Status: DC

## 2019-09-02 NOTE — Progress Notes (Signed)
PROGRESS NOTE    Elizabeth Villarreal  ZOX:[id number] DOB: [birth year] DOA: 08/27/2019 PCP: Marcine Matar, MD    Brief Narrative:  48 y.o. female with history of COPD previous tobacco abuse recently placed on gabapentin for neuropathy which patient has not taken presents to the ER with complaint of fever chills.  Patient states her symptoms started this morning and has not had any productive cough until patient reached ER.  Has not had any recent travel but has been living in the shelter for last 1 month.  Denies any caries in the teeth.  Denies drinking alcohol nausea vomiting diarrhea abdominal pain.  Has been examined right-sided pleuritic chest pain.  ED Course: In the ER patient was tachycardic with fever of 103 F lab work show WBC count of 22,000 lactic acid of 1.6 hemoglobin 12 creatinine 0.8 chest x-ray shows right upper lobe infiltrates concerning for pneumonia.  Blood cultures obtained and started on empiric antibiotics for community-acquired pneumonia COVID-19 test was negative initially.  Repeat one has been ordered which is pending.  Patient admitted for sepsis secondary pneumonia.  Assessment & Plan:   Principal Problem:   Sepsis (HCC) Active Problems:   COPD GOLD D  with chronic bronchitis (HCC)   Mild depression (HCC)   CAP (community acquired pneumonia)   1. Sepsis secondary to community-acquired pneumonia 1. COVID neg 2. patient has been continued on ceftriaxone and Zithromax follow sputum cultures blood cultures 3. Recheck CBC in AM 4.  As pt lives at shelter, concern for risk of TB exposure. QuantiFERON gold test is indeterminate. AFB is neg x 1. As pt is showing much improvement with current tx, doubt pt has TB 5. Pending results of second AFB 6. Tmax of 103.32F on 9/22. Now afebrile and stable 2. COPD exacerbation 1. Air movement much improved with steroids and nebs 2. COVID is neg 3. Cont neb tx. Wean to prednisone effective 9/29 4. O2 weaned room air 3.  Neuropathy. 1. Have started pt on neurontin QHS 2. Seems to be stable at this time 4. Tobacco abuse quit many years ago. 1. Pt congratulated initially 2. Declines nicotine patch   DVT prophylaxis: Lovenox subQ Code Status: Full Family Communication: Pt in room, family not at bedside Disposition Plan: Uncertain at this time  Consultants:     Procedures:     Antimicrobials: Anti-infectives (From admission, onward)   Start     Dose/Rate Route Frequency Ordered Stop   08/30/19 1800  azithromycin (ZITHROMAX) tablet 500 mg     500 mg Oral Daily-1800 08/30/19 1046 09/01/19 1820   08/28/19 1600  azithromycin (ZITHROMAX) 500 mg in sodium chloride 0.9 % 250 mL IVPB  Status:  Discontinued     500 mg 250 mL/hr over 60 Minutes Intravenous Every 24 hours 08/27/19 2129 08/30/19 1046   08/28/19 1000  cefTRIAXone (ROCEPHIN) 2 g in sodium chloride 0.9 % 100 mL IVPB     2 g 200 mL/hr over 30 Minutes Intravenous Every 24 hours 08/27/19 2129 09/01/19 1022   08/27/19 1915  cefTRIAXone (ROCEPHIN) 1 g in sodium chloride 0.9 % 100 mL IVPB     1 g 200 mL/hr over 30 Minutes Intravenous  Once 08/27/19 1910 08/27/19 2148   08/27/19 1915  azithromycin (ZITHROMAX) tablet 500 mg     500 mg Oral  Once 08/27/19 1910 08/27/19 1934      Subjective: Reports feeling well  Objective: Vitals:   09/02/19 0512 09/02/19 0700 09/02/19 1425 09/02/19 1541  BP: 125/78  107/74  Pulse: 65   82  Resp: 18   16  Temp: 98.1 F (36.7 C)   98.2 F (36.8 C)  TempSrc: Oral   Oral  SpO2: 94%  98% 97%  Weight:  67.3 kg    Height:        Intake/Output Summary (Last 24 hours) at 09/02/2019 1821 Last data filed at 09/02/2019 0900 Gross per 24 hour  Intake 720 ml  Output -  Net 720 ml   Filed Weights   08/27/19 2340 09/01/19 0327 09/02/19 0700  Weight: 61.5 kg 66.6 kg 67.3 kg    Examination: General exam: Awake, laying in bed, in nad Respiratory system: Normal respiratory effort, no wheezing Cardiovascular  system: regular rate, s1, s2 Gastrointestinal system: Soft, nondistended, positive BS Central nervous system: CN2-12 grossly intact, strength intact Extremities: Perfused, no clubbing Skin: Normal skin turgor, no notable skin lesions seen Psychiatry: Mood normal // no visual hallucinations   Data Reviewed: I have personally reviewed following labs and imaging studies  CBC: Recent Labs  Lab 08/27/19 1931 08/27/19 2348 08/28/19 0350 08/29/19 0446 08/31/19 0429  WBC 22.0* 18.0* 17.7* 20.2* 16.3*  NEUTROABS 18.9*  --   --   --   --   HGB 12.3 12.3 12.1 11.4* 10.5*  HCT 37.9 38.9 37.9 36.6 32.8*  MCV 94.0 96.3 94.3 97.1 96.2  PLT 262 238 239 263 371   Basic Metabolic Panel: Recent Labs  Lab 08/27/19 1817 08/27/19 2348 08/28/19 0350 08/29/19 0446 08/31/19 0429  NA 134*  --  136 138 139  K 3.8  --  3.3* 4.0 4.5  CL 100  --  107 109 107  CO2 23  --  22 20* 23  GLUCOSE 124*  --  120* 230* 174*  BUN 12  --  13 10 17   CREATININE 0.84 0.67 0.80 0.57 0.46  CALCIUM 9.0  --  7.9* 8.0* 8.4*   GFR: Estimated Creatinine Clearance: 86.8 mL/min (by C-G formula based on SCr of 0.46 mg/dL). Liver Function Tests: Recent Labs  Lab 08/27/19 1817 08/29/19 0446 08/31/19 0429  AST 19 18 28   ALT 14 15 30   ALKPHOS 59 46 60  BILITOT 0.6 0.3 0.2*  PROT 7.2 5.6* 5.4*  ALBUMIN 3.8 2.7* 2.4*   No results for input(s): LIPASE, AMYLASE in the last 168 hours. No results for input(s): AMMONIA in the last 168 hours. Coagulation Profile: Recent Labs  Lab 08/27/19 1931  INR 1.3*   Cardiac Enzymes: No results for input(s): CKTOTAL, CKMB, CKMBINDEX, TROPONINI in the last 168 hours. BNP (last 3 results) No results for input(s): PROBNP in the last 8760 hours. HbA1C: No results for input(s): HGBA1C in the last 72 hours. CBG: No results for input(s): GLUCAP in the last 168 hours. Lipid Profile: No results for input(s): CHOL, HDL, LDLCALC, TRIG, CHOLHDL, LDLDIRECT in the last 72 hours.  Thyroid Function Tests: No results for input(s): TSH, T4TOTAL, FREET4, T3FREE, THYROIDAB in the last 72 hours. Anemia Panel: No results for input(s): VITAMINB12, FOLATE, FERRITIN, TIBC, IRON, RETICCTPCT in the last 72 hours. Sepsis Labs: Recent Labs  Lab 08/27/19 1817 08/27/19 2348 08/28/19 0519  PROCALCITON  --  5.87  --   LATICACIDVEN 1.6 1.2 1.5    Recent Results (from the past 240 hour(s))  SARS Coronavirus 2 [facility] order, Performed in [facility] hospital lab) Nasopharyngeal Nasopharyngeal Swab     Status: None   Collection Time: 08/27/19  6:17 PM   Specimen: Nasopharyngeal Swab  Result Value Ref Range Status   SARS Coronavirus 2 NEGATIVE NEGATIVE Final    Comment: (NOTE) If result is NEGATIVE SARS-CoV-2 target nucleic acids are NOT DETECTED. The SARS-CoV-2 RNA is generally detectable in upper and lower  respiratory specimens during the acute phase of infection. The lowest  concentration of SARS-CoV-2 viral copies this assay can detect is 250  copies / mL. A negative result does not preclude SARS-CoV-2 infection  and should not be used as the sole basis for treatment or other  patient management decisions.  A negative result may occur with  improper specimen collection / handling, submission of specimen other  than nasopharyngeal swab, presence of viral mutation(s) within the  areas targeted by this assay, and inadequate number of viral copies  (<250 copies / mL). A negative result must be combined with clinical  observations, patient history, and epidemiological information. If result is POSITIVE SARS-CoV-2 target nucleic acids are DETECTED. The SARS-CoV-2 RNA is generally detectable in upper and lower  respiratory specimens dur ing the acute phase of infection.  Positive  results are indicative of active infection with SARS-CoV-2.  Clinical  correlation with patient history and other diagnostic information is  necessary to determine patient infection status.   Positive results do  not rule out bacterial infection or co-infection with other viruses. If result is PRESUMPTIVE POSTIVE SARS-CoV-2 nucleic acids MAY BE PRESENT.   A presumptive positive result was obtained on the submitted specimen  and confirmed on repeat testing.  While 2019 novel coronavirus  (SARS-CoV-2) nucleic acids may be present in the submitted sample  additional confirmatory testing may be necessary for epidemiological  and / or clinical management purposes  to differentiate between  SARS-CoV-2 and other Sarbecovirus currently known to infect humans.  If clinically indicated additional testing with an alternate test  methodology [phone]) is advised. The SARS-CoV-2 RNA is generally  detectable in upper and lower respiratory sp ecimens during the acute  phase of infection. The expected result is Negative. Fact Sheet for Patients:  BoilerBrush.com.cy Fact Sheet for Healthcare Providers: https://pope.com/ This test is not yet approved or cleared by the [place] FDA and has been authorized for detection and/or diagnosis of SARS-CoV-2 by FDA under an Emergency Use Authorization (EUA).  This EUA will remain in effect (meaning this test can be used) for the duration of the COVID-19 declaration under Section 564(b)(1) of the Act, 21 U.S.C. section 360bbb-3(b)(1), unless the authorization is terminated or revoked sooner. Performed at [facility], [address] [place], [place] [address]   Blood Culture (routine x 2)     Status: None   Collection Time: 08/27/19  7:30 PM   Specimen: BLOOD  Result Value Ref Range Status   Specimen Description   Final    BLOOD BLOOD LEFT HAND Performed at [facility], [address], [place], [place] [address]    Special Requests   Final    BOTTLES DRAWN AEROBIC AND ANAEROBIC Blood Culture results may not be optimal due to an inadequate volume of blood  received in culture bottles Performed at [facility], [address], [place], [place] [address]    Culture   Final    NO GROWTH 5 DAYS Performed at [facility], [address] [place], [place] [address]    Report Status 09/01/2019 FINAL  Final  SARS Coronavirus 2 [facility] order, Performed in [facility] hospital lab) Nasopharyngeal Nasopharyngeal Swab     Status: None  Collection Time: 08/27/19  9:23 PM   Specimen: Nasopharyngeal Swab  Result Value Ref Range Status   SARS Coronavirus 2 NEGATIVE NEGATIVE Final    Comment: (NOTE) If result is NEGATIVE SARS-CoV-2 target nucleic acids are NOT DETECTED. The SARS-CoV-2 RNA is generally detectable in upper and lower  respiratory specimens during the acute phase of infection. The lowest  concentration of SARS-CoV-2 viral copies this assay can detect is 250  copies / mL. A negative result does not preclude SARS-CoV-2 infection  and should not be used as the sole basis for treatment or other  patient management decisions.  A negative result may occur with  improper specimen collection / handling, submission of specimen other  than nasopharyngeal swab, presence of viral mutation(s) within the  areas targeted by this assay, and inadequate number of viral copies  (<250 copies / mL). A negative result must be combined with clinical  observations, patient history, and epidemiological information. If result is POSITIVE SARS-CoV-2 target nucleic acids are DETECTED. The SARS-CoV-2 RNA is generally detectable in upper and lower  respiratory specimens dur ing the acute phase of infection.  Positive  results are indicative of active infection with SARS-CoV-2.  Clinical  correlation with patient history and other diagnostic information is  necessary to determine patient infection status.  Positive results do  not rule out bacterial infection or co-infection with other viruses. If result is PRESUMPTIVE POSTIVE SARS-CoV-2  nucleic acids MAY BE PRESENT.   A presumptive positive result was obtained on the submitted specimen  and confirmed on repeat testing.  While 2019 novel coronavirus  (SARS-CoV-2) nucleic acids may be present in the submitted sample  additional confirmatory testing may be necessary for epidemiological  and / or clinical management purposes  to differentiate between  SARS-CoV-2 and other Sarbecovirus currently known to infect humans.  If clinically indicated additional testing with an alternate test  methodology [phone]([id number]) is advised. The SARS-CoV-2 RNA is generally  detectable in upper and lower respiratory sp ecimens during the acute  phase of infection. The expected result is Negative. Fact Sheet for Patients:  BoilerBrush.com.cyhttps://www.fda.gov/media/136312/download Fact Sheet for Healthcare Providers: https://pope.com/https://www.fda.gov/media/136313/download This test is not yet approved or cleared by the [place] FDA and has been authorized for detection and/or diagnosis of SARS-CoV-2 by FDA under an Emergency Use Authorization (EUA).  This EUA will remain in effect (meaning this test can be used) for the duration of the COVID-19 declaration under Section 564(b)(1) of the Act, 21 U.S.C. section 360bbb-3(b)(1), unless the authorization is terminated or revoked sooner. Performed at [facility], [address] [place] [address] [place], [place] [address]   Urine culture     Status: None   Collection Time: 08/27/19 10:00 PM   Specimen: In/Out Cath Urine  Result Value Ref Range Status   Specimen Description   Final    IN/OUT CATH URINE Performed at [facility], [address] [place], [place] [address]    Special Requests   Final    NONE Performed at [facility], [address] [place], [place] [address]    Culture   Final    NO GROWTH Performed at [facility], [address] [place], [place] [address]    Report Status 08/28/2019 FINAL  Final  Blood  Culture (routine x 2)     Status: None   Collection Time: 08/27/19 11:48 PM   Specimen: BLOOD  Result Value Ref Range Status   Specimen Description   Final  BLOOD LEFT WRIST Performed at [place] [address] [place], [place] [address]    Special Requests   Final    BOTTLES DRAWN AEROBIC ONLY Blood Culture adequate volume Performed at [place] [address], [place], [place] [address]    Culture   Final    NO GROWTH 5 DAYS Performed at [facility], [place] [address] [place], [place] [address]    Report Status 09/02/2019 FINAL  Final  Expectorated sputum assessment w rflx to resp cult     Status: None   Collection Time: 08/28/19  9:27 AM   Specimen: Sputum  Result Value Ref Range Status   Specimen Description SPUTUM  Final   Special Requests NONE  Final   Sputum evaluation   Final    Sputum specimen not acceptable for testing.  Please recollect.   INFORMED DANA @0945  ON 9.23.2020 BY [facility] Performed at [facility], [place] [address], [place], [place] [address]    Report Status 08/28/2019 FINAL  Final  Acid Fast Smear (AFB)     Status: None   Collection Time: 08/28/19  2:47 PM   Specimen: Sputum  Result Value Ref Range Status   AFB Specimen Processing Concentration  Final   Acid Fast Smear Negative  Final    Comment: (NOTE) Performed At: [facility] [place], [place] [id number] Rush Farmer MD [id number]    Source (AFB) SPUTUM  Final    Comment: Performed at [facility], [place] [address] [place], [place] [address]     Radiology Studies: No results found.  Scheduled Meds: . enoxaparin (LOVENOX) injection  40 mg Subcutaneous QHS  . folic acid  1 mg Oral Daily  . gabapentin  300 mg Oral BID  . ipratropium  0.5 mg Nebulization Q6H WA  . levalbuterol  0.63 mg Nebulization Q6H WA  . mometasone-formoterol  2 puff Inhalation BID  . potassium chloride   20 mEq Oral BID  . [START ON 09/03/2019] predniSONE  60 mg Oral Q breakfast  . vitamin B-12  500 mcg Oral Daily   Continuous Infusions:    LOS: 6 days   Marylu Lund, MD [facility] Pager On Amion  If 7PM-7AM, please contact night-coverage 09/02/2019, 6:21 PM

## 2019-09-03 LAB — CREATININE, SERUM
Creatinine, Ser: 0.57 mg/dL (ref 0.44–1.00)
GFR calc Af Amer: >60 mL/min (ref >60)
GFR calc non Af Amer: >60 mL/min (ref >60)

## 2019-09-03 LAB — ACID FAST SMEAR (AFB, MYCOBACTERIA): Acid Fast Smear: NEGATIVE

## 2019-09-03 MED ORDER — PREDNISONE 20 MG PO TABS
40.0000 mg | ORAL_TABLET | Freq: Every day | ORAL | Status: DC
  Administered 2019-09-04: 40 mg via ORAL
  Filled 2019-09-03 (×2): qty 2

## 2019-09-03 NOTE — Progress Notes (Signed)
PROGRESS NOTE    Elizabeth Villarreal  IDP:[id number] DOB: 1971/01/09 DOA: 08/27/2019 PCP: Marcine Matar, MD    Brief Narrative:  48 y.o. female with history of COPD previous tobacco abuse recently placed on gabapentin for neuropathy which patient has not taken presents to the ER with complaint of fever chills.  Patient states her symptoms started this morning and has not had any productive cough until patient reached ER.  Has not had any recent travel but has been living in the shelter for last 1 month.  Denies any caries in the teeth.  Denies drinking alcohol nausea vomiting diarrhea abdominal pain.  Has been examined right-sided pleuritic chest pain.  ED Course: In the ER patient was tachycardic with fever of 103 F lab work show WBC count of 22,000 lactic acid of 1.6 hemoglobin 12 creatinine 0.8 chest x-ray shows right upper lobe infiltrates concerning for pneumonia.  Blood cultures obtained and started on empiric antibiotics for community-acquired pneumonia COVID-19 test was negative initially.  Repeat one has been ordered which is pending.  Patient admitted for sepsis secondary pneumonia.  Assessment & Plan:   Principal Problem:   Sepsis (HCC) Active Problems:   COPD GOLD D  with chronic bronchitis (HCC)   Mild depression (HCC)   CAP (community acquired pneumonia)   1. Sepsis secondary to community-acquired pneumonia 1. COVID neg 2. patient has been continued on ceftriaxone and Zithromax follow sputum cultures blood cultures 3. Recheck CBC in AM 4.  As pt lives at shelter, initial concern for risk of TB exposure. 5. Discussed with ID, who recommended f/u QuantiFERON gold plus AFB x 2. QuantiFERON gold test noted to be indeterminate and AFB is now neg x 2. TB ruled out 6. Remove TB precautions 7. Tmax of 103.72F on 9/22. Now afebrile and stable 2. COPD exacerbation 1. Air movement much improved with steroids and nebs 2. COVID is neg 3. Continue with prednisone taper 3. Neuropathy.  1. Have started pt on neurontin QHS, increased to BID dosing 2. Seems to be stable at this time 4. Tobacco abuse quit many years ago. 1. Pt congratulated initially 2. Declines nicotine patch   DVT prophylaxis: Lovenox subQ Code Status: Full Family Communication: Pt in room, family not at bedside Disposition Plan: Likely home on 9/30  Consultants:   Discussed case with ID  Procedures:     Antimicrobials: Anti-infectives (From admission, onward)   Start     Dose/Rate Route Frequency Ordered Stop   08/30/19 1800  azithromycin (ZITHROMAX) tablet 500 mg     500 mg Oral Daily-1800 08/30/19 1046 09/01/19 1820   08/28/19 1600  azithromycin (ZITHROMAX) 500 mg in sodium chloride 0.9 % 250 mL IVPB  Status:  Discontinued     500 mg 250 mL/hr over 60 Minutes Intravenous Every 24 hours 08/27/19 2129 08/30/19 1046   08/28/19 1000  cefTRIAXone (ROCEPHIN) 2 g in sodium chloride 0.9 % 100 mL IVPB     2 g 200 mL/hr over 30 Minutes Intravenous Every 24 hours 08/27/19 2129 09/01/19 1022   08/27/19 1915  cefTRIAXone (ROCEPHIN) 1 g in sodium chloride 0.9 % 100 mL IVPB     1 g 200 mL/hr over 30 Minutes Intravenous  Once 08/27/19 1910 08/27/19 2148   08/27/19 1915  azithromycin (ZITHROMAX) tablet 500 mg     500 mg Oral  Once 08/27/19 1910 08/27/19 1934      Subjective: Without complaints. Feels well  Objective: Vitals:   09/03/19 4431 09/03/19 0505 09/03/19 5400  09/03/19 1330  BP: 118/72   121/75  Pulse: 73   77  Resp: 16   18  Temp: 98.6 F (37 C)   98.1 F (36.7 C)  TempSrc: Oral   Oral  SpO2: 94%  95% 97%  Weight:  65.3 kg    Height:        Intake/Output Summary (Last 24 hours) at 09/03/2019 1803 Last data filed at 09/02/2019 2000 Gross per 24 hour  Intake 240 ml  Output -  Net 240 ml   Filed Weights   09/01/19 0327 09/02/19 0700 09/03/19 0505  Weight: 66.6 kg 67.3 kg 65.3 kg    Examination: General exam: Conversant, in no acute distress Respiratory system: normal  chest rise, clear, no audible wheezing Cardiovascular system: regular rhythm, s1-s2 Gastrointestinal system: Nondistended, nontender, pos BS Central nervous system: No seizures, no tremors Extremities: No cyanosis, no joint deformities Skin: No rashes, no pallor Psychiatry: Affect normal // no auditory hallucinations   Data Reviewed: I have personally reviewed following labs and imaging studies  CBC: Recent Labs  Lab 08/27/19 1931 08/27/19 2348 08/28/19 0350 08/29/19 0446 08/31/19 0429  WBC 22.0* 18.0* 17.7* 20.2* 16.3*  NEUTROABS 18.9*  --   --   --   --   HGB 12.3 12.3 12.1 11.4* 10.5*  HCT 37.9 38.9 37.9 36.6 32.8*  MCV 94.0 96.3 94.3 97.1 96.2  PLT 262 238 239 263 267   Basic Metabolic Panel: Recent Labs  Lab 08/27/19 1817 08/27/19 2348 08/28/19 0350 08/29/19 0446 08/31/19 0429 09/03/19 0655  NA 134*  --  136 138 139  --   K 3.8  --  3.3* 4.0 4.5  --   CL 100  --  107 109 107  --   CO2 23  --  22 20* 23  --   GLUCOSE 124*  --  120* 230* 174*  --   BUN 12  --  13 10 17   --   CREATININE 0.84 0.67 0.80 0.57 0.46 0.57  CALCIUM 9.0  --  7.9* 8.0* 8.4*  --    GFR: Estimated Creatinine Clearance: 86.8 mL/min (by C-G formula based on SCr of 0.57 mg/dL). Liver Function Tests: Recent Labs  Lab 08/27/19 1817 08/29/19 0446 08/31/19 0429  AST 19 18 28   ALT 14 15 30   ALKPHOS 59 46 60  BILITOT 0.6 0.3 0.2*  PROT 7.2 5.6* 5.4*  ALBUMIN 3.8 2.7* 2.4*   No results for input(s): LIPASE, AMYLASE in the last 168 hours. No results for input(s): AMMONIA in the last 168 hours. Coagulation Profile: Recent Labs  Lab 08/27/19 1931  INR 1.3*   Cardiac Enzymes: No results for input(s): CKTOTAL, CKMB, CKMBINDEX, TROPONINI in the last 168 hours. BNP (last 3 results) No results for input(s): PROBNP in the last 8760 hours. HbA1C: No results for input(s): HGBA1C in the last 72 hours. CBG: No results for input(s): GLUCAP in the last 168 hours. Lipid Profile: No results for  input(s): CHOL, HDL, LDLCALC, TRIG, CHOLHDL, LDLDIRECT in the last 72 hours. Thyroid Function Tests: No results for input(s): TSH, T4TOTAL, FREET4, T3FREE, THYROIDAB in the last 72 hours. Anemia Panel: No results for input(s): VITAMINB12, FOLATE, FERRITIN, TIBC, IRON, RETICCTPCT in the last 72 hours. Sepsis Labs: Recent Labs  Lab 08/27/19 1817 08/27/19 2348 08/28/19 0519  PROCALCITON  --  5.87  --   LATICACIDVEN 1.6 1.2 1.5    Recent Results (from the past 240 hour(s))  SARS Coronavirus 2 [facility] order, Performed  in [facility] hospital lab) Nasopharyngeal Nasopharyngeal Swab     Status: None   Collection Time: 08/27/19  6:17 PM   Specimen: Nasopharyngeal Swab  Result Value Ref Range Status   SARS Coronavirus 2 NEGATIVE NEGATIVE Final    Comment: (NOTE) If result is NEGATIVE SARS-CoV-2 target nucleic acids are NOT DETECTED. The SARS-CoV-2 RNA is generally detectable in upper and lower  respiratory specimens during the acute phase of infection. The lowest  concentration of SARS-CoV-2 viral copies this assay can detect is 250  copies / mL. A negative result does not preclude SARS-CoV-2 infection  and should not be used as the sole basis for treatment or other  patient management decisions.  A negative result may occur with  improper specimen collection / handling, submission of specimen other  than nasopharyngeal swab, presence of viral mutation(s) within the  areas targeted by this assay, and inadequate number of viral copies  (<250 copies / mL). A negative result must be combined with clinical  observations, patient history, and epidemiological information. If result is POSITIVE SARS-CoV-2 target nucleic acids are DETECTED. The SARS-CoV-2 RNA is generally detectable in upper and lower  respiratory specimens dur ing the acute phase of infection.  Positive  results are indicative of active infection with SARS-CoV-2.  Clinical  correlation with patient history and other  diagnostic information is  necessary to determine patient infection status.  Positive results do  not rule out bacterial infection or co-infection with other viruses. If result is PRESUMPTIVE POSTIVE SARS-CoV-2 nucleic acids MAY BE PRESENT.   A presumptive positive result was obtained on the submitted specimen  and confirmed on repeat testing.  While 2019 novel coronavirus  (SARS-CoV-2) nucleic acids may be present in the submitted sample  additional confirmatory testing may be necessary for epidemiological  and / or clinical management purposes  to differentiate between  SARS-CoV-2 and other Sarbecovirus currently known to infect humans.  If clinically indicated additional testing with an alternate test  methodology [phone]) is advised. The SARS-CoV-2 RNA is generally  detectable in upper and lower respiratory sp ecimens during the acute  phase of infection. The expected result is Negative. Fact Sheet for Patients:  BoilerBrush.com.cy Fact Sheet for Healthcare Providers: https://pope.com/ This test is not yet approved or cleared by the [place] FDA and has been authorized for detection and/or diagnosis of SARS-CoV-2 by FDA under an Emergency Use Authorization (EUA).  This EUA will remain in effect (meaning this test can be used) for the duration of the COVID-19 declaration under Section 564(b)(1) of the Act, 21 U.S.C. section 360bbb-3(b)(1), unless the authorization is terminated or revoked sooner. Performed at [facility], [address] [place], [place] [address]   Blood Culture (routine x 2)     Status: None   Collection Time: 08/27/19  7:30 PM   Specimen: BLOOD  Result Value Ref Range Status   Specimen Description   Final    BLOOD BLOOD LEFT HAND Performed at [facility], [address] [place], [place] [address]    Special Requests   Final    BOTTLES DRAWN AEROBIC AND ANAEROBIC  Blood Culture results may not be optimal due to an inadequate volume of blood received in culture bottles Performed at [facility], [address] [place], [place] [address]    Culture   Final    NO GROWTH 5 DAYS Performed at [facility], [address], [place], [place] [address]  Report Status 09/01/2019 FINAL  Final  SARS Coronavirus 2 [facility] order, Performed in [facility] hospital lab) Nasopharyngeal Nasopharyngeal Swab     Status: None   Collection Time: 08/27/19  9:23 PM   Specimen: Nasopharyngeal Swab  Result Value Ref Range Status   SARS Coronavirus 2 NEGATIVE NEGATIVE Final    Comment: (NOTE) If result is NEGATIVE SARS-CoV-2 target nucleic acids are NOT DETECTED. The SARS-CoV-2 RNA is generally detectable in upper and lower  respiratory specimens during the acute phase of infection. The lowest  concentration of SARS-CoV-2 viral copies this assay can detect is 250  copies / mL. A negative result does not preclude SARS-CoV-2 infection  and should not be used as the sole basis for treatment or other  patient management decisions.  A negative result may occur with  improper specimen collection / handling, submission of specimen other  than nasopharyngeal swab, presence of viral mutation(s) within the  areas targeted by this assay, and inadequate number of viral copies  (<250 copies / mL). A negative result must be combined with clinical  observations, patient history, and epidemiological information. If result is POSITIVE SARS-CoV-2 target nucleic acids are DETECTED. The SARS-CoV-2 RNA is generally detectable in upper and lower  respiratory specimens dur ing the acute phase of infection.  Positive  results are indicative of active infection with SARS-CoV-2.  Clinical  correlation with patient history and other diagnostic information is  necessary to determine patient infection status.  Positive results do  not rule out bacterial infection or  co-infection with other viruses. If result is PRESUMPTIVE POSTIVE SARS-CoV-2 nucleic acids MAY BE PRESENT.   A presumptive positive result was obtained on the submitted specimen  and confirmed on repeat testing.  While 2019 novel coronavirus  (SARS-CoV-2) nucleic acids may be present in the submitted sample  additional confirmatory testing may be necessary for epidemiological  and / or clinical management purposes  to differentiate between  SARS-CoV-2 and other Sarbecovirus currently known to infect humans.  If clinically indicated additional testing with an alternate test  methodology [phone]) is advised. The SARS-CoV-2 RNA is generally  detectable in upper and lower respiratory sp ecimens during the acute  phase of infection. The expected result is Negative. Fact Sheet for Patients:  BoilerBrush.com.cy Fact Sheet for Healthcare Providers: https://pope.com/ This test is not yet approved or cleared by the [place] FDA and has been authorized for detection and/or diagnosis of SARS-CoV-2 by FDA under an Emergency Use Authorization (EUA).  This EUA will remain in effect (meaning this test can be used) for the duration of the COVID-19 declaration under Section 564(b)(1) of the Act, 21 U.S.C. section 360bbb-3(b)(1), unless the authorization is terminated or revoked sooner. Performed at [facility], [address] [place], [place] [address]   Urine culture     Status: None   Collection Time: 08/27/19 10:00 PM   Specimen: In/Out Cath Urine  Result Value Ref Range Status   Specimen Description   Final    IN/OUT CATH URINE Performed at [facility], [address], [place], [place] [address]    Special Requests   Final    NONE Performed at [facility], [address] [place], [place] [address]    Culture   Final    NO GROWTH Performed at [facility], [address] [place], [place] [address]    Report Status 08/28/2019 FINAL  Final  Blood Culture (routine x 2)  Status: None   Collection Time: 08/27/19 11:48 PM   Specimen: BLOOD  Result Value Ref Range Status   Specimen Description   Final    BLOOD LEFT WRIST Performed at [facility], [address] [place], [place] [address]    Special Requests   Final    BOTTLES DRAWN AEROBIC ONLY Blood Culture adequate volume Performed at [facility], [address] [place], [place] [address]    Culture   Final    NO GROWTH 5 DAYS Performed at [facility], [address] [place], [place] [address]    Report Status 09/02/2019 FINAL  Final  Expectorated sputum assessment w rflx to resp cult     Status: None   Collection Time: 08/28/19  9:27 AM   Specimen: Sputum  Result Value Ref Range Status   Specimen Description SPUTUM  Final   Special Requests NONE  Final   Sputum evaluation   Final    Sputum specimen not acceptable for testing.  Please recollect.   INFORMED DANA @0945  ON 9.23.2020 BY [facility] Performed at [facility], [address] [place], [place] [address]    Report Status 08/28/2019 FINAL  Final  Acid Fast Smear (AFB)     Status: None   Collection Time: 08/28/19  2:47 PM   Specimen: Sputum  Result Value Ref Range Status   AFB Specimen Processing Concentration  Final   Acid Fast Smear Negative  Final    Comment: (NOTE) Performed At: [facility] [address] [place], [place] [id number] Jolene SchimkeNagendra Sanjai MD VZ:[id number]:[phone]    Source (AFB) SPUTUM  Final    Comment: Performed at [facility], [address] [place], [place] [address]  Acid Fast Smear (AFB)     Status: None   Collection Time: 09/01/19  2:12 AM   Specimen: Sputum  Result Value Ref Range Status   AFB Specimen Processing Concentration  Final   Acid Fast Smear Negative  Final    Comment: (NOTE) Performed At: [facility]  [place] [address] [place], [place] [id number] Jolene SchimkeNagendra Sanjai MD ZS:[id number]:[phone]    Source (AFB) SPUTUM  Final    Comment: Performed at [facility], [address] [place], [place] [address]     Radiology Studies: No results found.  Scheduled Meds: . albuterol  2.5 mg Nebulization TID  . enoxaparin (LOVENOX) injection  40 mg Subcutaneous QHS  . folic acid  1 mg Oral Daily  . gabapentin  300 mg Oral BID  . ipratropium  0.5 mg Nebulization TID  . mometasone-formoterol  2 puff Inhalation BID  . potassium chloride  20 mEq Oral BID  . [START ON 09/04/2019] predniSONE  40 mg Oral Q breakfast  . vitamin B-12  500 mcg Oral Daily   Continuous Infusions:    LOS: 7 days   Rickey BarbaraStephen Nitara Szczerba, MD [facility] Pager On Amion  If 7PM-7AM, please contact night-coverage 09/03/2019, 6:03 PM

## 2019-09-04 MED ORDER — PREDNISONE 20 MG PO TABS: 20.0000 mg | ORAL_TABLET | Freq: Every day | ORAL | 0 refills | Status: DC

## 2019-09-05 ENCOUNTER — Other Ambulatory Visit: Payer: Self-pay

## 2019-09-05 ENCOUNTER — Encounter: Payer: Self-pay | Admitting: Family Medicine

## 2019-09-05 ENCOUNTER — Ambulatory Visit: Payer: Self-pay | Attending: Critical Care Medicine

## 2019-09-05 ENCOUNTER — Encounter: Payer: Medicaid - Out of State | Admitting: Obstetrics and Gynecology

## 2019-09-05 NOTE — Progress Notes (Deleted)
Virtual Visit via Telephone Note  I connected with Elizabeth Villarreal on 09/05/19 at 10:50 AM EDT by telephone and verified that I am speaking with the correct person using two identifiers.   I discussed the limitations, risks, security and privacy concerns of performing an evaluation and management service by telephone and the availability of in person appointments. I also discussed with the patient that there may be a patient responsible charge related to this service. The patient expressed understanding and agreed to proceed.  Patient location: My Location:  [place] office Persons on the call:    History of Present Illness:  ED and admitted 9/22:  ED Course: In the ER patient was tachycardic with fever of 103 F lab work show WBC count of 22,000 lactic acid of 1.6 hemoglobin 12 creatinine 0.8 chest x-ray shows right upper lobe infiltrates concerning for pneumonia.  Blood cultures obtained and started on empiric antibiotics for community-acquired pneumonia COVID-19 test was negative initially.  Repeat one has been ordered which is pending.  Patient admitted for sepsis secondary pneumonia.  1. Sepsis secondary to community-acquired pneumonia -patient has been placed on ceftriaxone and Zithromax follow sputum cultures blood cultures continue IV fluids follow lactate procalcitonin.  Second COVID-19 test has been sent out.  Since patient has been living in a shelter with right upper lobe predominant pneumonia will check for TB for which I ordered AFB smears and QuantiFERON gold test. 2. COPD will continue inhalers change to nebulizer once COVID-19 test comes negative. 3. Recently placed on gabapentin which patient needed to take for neuropathy. 4. Tobacco abuse quit many years ago.  Given the septic picture with pneumonia patient will need more than 2 midnight stay with inpatient status.    Observations/Objective:   Assessment and Plan:   Follow Up Instructions:    I discussed the assessment  and treatment plan with the patient. The patient was provided an opportunity to ask questions and all were answered. The patient agreed with the plan and demonstrated an understanding of the instructions.   The patient was advised to call back or seek an in-person evaluation if the symptoms worsen or if the condition fails to improve as anticipated.  I provided *** minutes of non-face-to-face time during this encounter.   Freeman Caldron, PA-C  Patient ID: Elizabeth Villarreal, female   DOB: 09-Jun-1971, 48 y.o.   MRN: [id number]

## 2019-09-05 NOTE — Discharge Summary (Signed)
Physician Discharge Summary  Elizabeth Villarreal ZOX:[id number] DOB: May 23, 1971 DOA: 08/27/2019  PCP: Marcine MatarJohnson, Deborah B, MD  Admit date: 08/27/2019 Discharge date: 09/05/2019  Time spent: 35 minutes  Recommendations for Outpatient Follow-up:  1. PCP in 1 week 2. Follow-up chest x-ray in 6 weeks   Discharge Diagnoses:  Principal Problem:   Sepsis (HCC) Active Problems:   COPD GOLD D  with chronic bronchitis (HCC)   Mild depression (HCC)   CAP (community acquired pneumonia)   Discharge Condition: improved  Diet recommendation: stable  Filed Weights   09/01/19 0327 09/02/19 0700 09/03/19 0505  Weight: 66.6 kg 67.3 kg 65.3 kg    History of present illness:  48 y.o.femalewithhistory of COPD previous tobacco abuse recently placed on gabapentin for neuropathy which patient has not taken presents to the ER with complaint of fever chills. Patient states her symptoms started this morning and has not had any productive cough until patient reached ER. Has not had any recent travel but has been living in the shelter for last 1 month  Hospital Course:   1. Sepsis/community-acquired pneumonia -Secondary to community-acquired pneumonia, clinically improved significantly with IV ceftriaxone and azithromycin, sputum cultures were negative -COVID-19 PCR is negative -Given upper lobe infiltrates and as she lives in a shelter, a QuantiFERON-TB gold was sent out on admission, this was noted to be in the indeterminant, case was discussed by my partner with infectious disease, subsequently had AFB smears sent which were negative -Hence no further work-up was not felt to be indicated  2.  COPD exacerbation, pneumonia  -Clinically improved as above, transition to prednisone taper and oral antibiotics to complete course -Recommend follow-up chest x-ray in 4 to 6 weeks  3.  Peripheral neuropathy, continue gabapentin  4.  Tobacco abuse -Quit smoking 2 years ago, counseled    Discharge  Exam: Vitals:   09/04/19 0453 09/04/19 0754  BP: (!) 91/54   Pulse: 67   Resp: 16   Temp: 98 F (36.7 C)   SpO2: 94% 96%    General: AAOx3 Cardiovascular: S1S2/RRR Respiratory: CTAB  Discharge Instructions   Discharge Instructions    Diet general   Complete by: As directed    Increase activity slowly   Complete by: As directed      Allergies as of 09/04/2019      Reactions   Darvon [propoxyphene] Other (See Comments)   Patient was aware her spoken words were not matching her thoughts      Medication List    TAKE these medications   acetaminophen 325 MG tablet Commonly known as: TYLENOL Take 650 mg by mouth every 6 (six) hours as needed for mild pain. Notes to patient: As needed per instructions   albuterol (2.5 MG/3ML) 0.083% nebulizer solution Commonly known as: PROVENTIL USE ONE VIAL VIA NEBULIZER EVERY SIX HOURS AS NEEDED FOR WHEEZING OR SHORTNESS OF BREATH WITH THE IPRATROPIUM What changed:   how much to take  when to take this  reasons to take this  additional instructions Notes to patient: Every 6 hours as needed for wheezing or shortness of breath   albuterol 108 (90 Base) MCG/ACT inhaler Commonly known as: ProAir HFA Inhale 1-2 puffs into the lungs every 6 (six) hours as needed for wheezing or shortness of breath. What changed: Another medication with the same name was changed. Make sure you understand how and when to take each. Notes to patient: As needed per instructions, every 6 hours as needed for shortness of breath or wheezing  Dulera 100-5 MCG/ACT Aero Generic drug: mometasone-formoterol Inhale 2 puffs into the lungs 2 (two) times daily. MUST MAKE APPT FOR FURTHER REFILLS Notes to patient: Tonight at bedtime   folic acid 1 MG tablet Commonly known as: FOLVITE Take 1 tablet (1 mg total) by mouth daily. Notes to patient: 10/1 Tomorrow AM   gabapentin 300 MG capsule Commonly known as: NEURONTIN Take 1 capsule (300 mg total) by mouth  at bedtime. Notes to patient: Tonight at bedtime 9/30   ipratropium 0.02 % nebulizer solution Commonly known as: ATROVENT Take 2.5 mLs (0.5 mg total) by nebulization 4 (four) times daily. Notes to patient: This afternoon 9/30   predniSONE 20 MG tablet Commonly known as: DELTASONE Take 1-2 tablets (20-40 mg total) by mouth daily with breakfast. Take 40mg  daily for 2days then 20mg  daily for 2days then STOP Notes to patient: 40 mg tomorrow 10/1 AM   vitamin B-12 500 MCG tablet Commonly known as: CYANOCOBALAMIN Take 1 tablet (500 mcg total) by mouth daily. Notes to patient: Tomorrow 10/1 AM      Allergies  Allergen Reactions  . Darvon [Propoxyphene] Other (See Comments)    Patient was aware her spoken words were not matching her thoughts      The results of significant diagnostics from this hospitalization (including imaging, microbiology, ancillary and laboratory) are listed below for reference.    Significant Diagnostic Studies: Dg Chest Port 1 View  Result Date: 08/27/2019 CLINICAL DATA:  Initial evaluation for acute fever, body aches. EXAM: PORTABLE CHEST 1 VIEW COMPARISON:  Prior radiograph from 03/28/2018 FINDINGS: Cardiac and mediastinal silhouettes are stable in size and contour, and remain within normal limits. Lungs hyperinflated with underlying emphysematous changes. Streaky bibasilar atelectasis and/or scarring. Focal consolidative opacity and infiltrate present within the inferior right upper lobe, compatible with pneumonia. Left lung is largely clear. No pulmonary edema or pleural effusion. No pneumothorax. No acute osseous finding. IMPRESSION: 1. Consolidative right upper lobe infiltrate, consistent with pneumonia. Followup PA and lateral chest X-ray is recommended in 3-4 weeks following trial of antibiotic therapy to ensure resolution and exclude underlying malignancy. 2. Underlying emphysema with streaky bibasilar atelectasis and/or scarring. Electronically Signed   By:  Jeannine Boga M.D.   On: 08/27/2019 19:05    Microbiology: Recent Results (from the past 240 hour(s))  SARS Coronavirus 2 [facility] order, Performed in [facility] hospital lab) Nasopharyngeal Nasopharyngeal Swab     Status: None   Collection Time: 08/27/19  6:17 PM   Specimen: Nasopharyngeal Swab  Result Value Ref Range Status   SARS Coronavirus 2 NEGATIVE NEGATIVE Final    Comment: (NOTE) If result is NEGATIVE SARS-CoV-2 target nucleic acids are NOT DETECTED. The SARS-CoV-2 RNA is generally detectable in upper and lower  respiratory specimens during the acute phase of infection. The lowest  concentration of SARS-CoV-2 viral copies this assay can detect is 250  copies / mL. A negative result does not preclude SARS-CoV-2 infection  and should not be used as the sole basis for treatment or other  patient management decisions.  A negative result may occur with  improper specimen collection / handling, submission of specimen other  than nasopharyngeal swab, presence of viral mutation(s) within the  areas targeted by this assay, and inadequate number of viral copies  (<250 copies / mL). A negative result must be combined with clinical  observations, patient history, and epidemiological information. If result is POSITIVE SARS-CoV-2 target nucleic acids are DETECTED. The SARS-CoV-2 RNA is generally detectable in upper  and lower  respiratory specimens dur ing the acute phase of infection.  Positive  results are indicative of active infection with SARS-CoV-2.  Clinical  correlation with patient history and other diagnostic information is  necessary to determine patient infection status.  Positive results do  not rule out bacterial infection or co-infection with other viruses. If result is PRESUMPTIVE POSTIVE SARS-CoV-2 nucleic acids MAY BE PRESENT.   A presumptive positive result was obtained on the submitted specimen  and confirmed on repeat testing.  While 2019 novel coronavirus   (SARS-CoV-2) nucleic acids may be present in the submitted sample  additional confirmatory testing may be necessary for epidemiological  and / or clinical management purposes  to differentiate between  SARS-CoV-2 and other Sarbecovirus currently known to infect humans.  If clinically indicated additional testing with an alternate test  methodology [phone]) is advised. The SARS-CoV-2 RNA is generally  detectable in upper and lower respiratory sp ecimens during the acute  phase of infection. The expected result is Negative. Fact Sheet for Patients:  BoilerBrush.com.cy Fact Sheet for Healthcare Providers: https://pope.com/ This test is not yet approved or cleared by the [place] FDA and has been authorized for detection and/or diagnosis of SARS-CoV-2 by FDA under an Emergency Use Authorization (EUA).  This EUA will remain in effect (meaning this test can be used) for the duration of the COVID-19 declaration under Section 564(b)(1) of the Act, 21 U.S.C. section 360bbb-3(b)(1), unless the authorization is terminated or revoked sooner. Performed at [facility], [address], [place], [place] [address]   Blood Culture (routine x 2)     Status: None   Collection Time: 08/27/19  7:30 PM   Specimen: BLOOD  Result Value Ref Range Status   Specimen Description   Final    BLOOD BLOOD LEFT HAND Performed at [facility], [address], [place], [place] [address]    Special Requests   Final    BOTTLES DRAWN AEROBIC AND ANAEROBIC Blood Culture results may not be optimal due to an inadequate volume of blood received in culture bottles Performed at [facility], [address], [place], [place] [address]    Culture   Final    NO GROWTH 5 DAYS Performed at [facility], [address] [place], [place] [address]    Report Status 09/01/2019 FINAL  Final  SARS Coronavirus 2  [facility] order, Performed in [facility] hospital lab) Nasopharyngeal Nasopharyngeal Swab     Status: None   Collection Time: 08/27/19  9:23 PM   Specimen: Nasopharyngeal Swab  Result Value Ref Range Status   SARS Coronavirus 2 NEGATIVE NEGATIVE Final    Comment: (NOTE) If result is NEGATIVE SARS-CoV-2 target nucleic acids are NOT DETECTED. The SARS-CoV-2 RNA is generally detectable in upper and lower  respiratory specimens during the acute phase of infection. The lowest  concentration of SARS-CoV-2 viral copies this assay can detect is 250  copies / mL. A negative result does not preclude SARS-CoV-2 infection  and should not be used as the sole basis for treatment or other  patient management decisions.  A negative result may occur with  improper specimen collection / handling, submission of specimen other  than nasopharyngeal swab, presence of viral mutation(s) within the  areas targeted by this assay, and inadequate number of viral copies  (<250 copies / mL). A negative result must be combined with clinical  observations, patient history, and epidemiological information. If result is POSITIVE  SARS-CoV-2 target nucleic acids are DETECTED. The SARS-CoV-2 RNA is generally detectable in upper and lower  respiratory specimens dur ing the acute phase of infection.  Positive  results are indicative of active infection with SARS-CoV-2.  Clinical  correlation with patient history and other diagnostic information is  necessary to determine patient infection status.  Positive results do  not rule out bacterial infection or co-infection with other viruses. If result is PRESUMPTIVE POSTIVE SARS-CoV-2 nucleic acids MAY BE PRESENT.   A presumptive positive result was obtained on the submitted specimen  and confirmed on repeat testing.  While 2019 novel coronavirus  (SARS-CoV-2) nucleic acids may be present in the submitted sample  additional confirmatory testing may be necessary for  epidemiological  and / or clinical management purposes  to differentiate between  SARS-CoV-2 and other Sarbecovirus currently known to infect humans.  If clinically indicated additional testing with an alternate test  methodology [phone]) is advised. The SARS-CoV-2 RNA is generally  detectable in upper and lower respiratory sp ecimens during the acute  phase of infection. The expected result is Negative. Fact Sheet for Patients:  BoilerBrush.com.cy Fact Sheet for Healthcare Providers: https://pope.com/ This test is not yet approved or cleared by the [place] FDA and has been authorized for detection and/or diagnosis of SARS-CoV-2 by FDA under an Emergency Use Authorization (EUA).  This EUA will remain in effect (meaning this test can be used) for the duration of the COVID-19 declaration under Section 564(b)(1) of the Act, 21 U.S.C. section 360bbb-3(b)(1), unless the authorization is terminated or revoked sooner. Performed at [facility], [address] [place], [place] [address]   Urine culture     Status: None   Collection Time: 08/27/19 10:00 PM   Specimen: In/Out Cath Urine  Result Value Ref Range Status   Specimen Description   Final    IN/OUT CATH URINE Performed at [facility], [address] [place], [place] [address]    Special Requests   Final    NONE Performed at [facility], [address], [place], [place] [address]    Culture   Final    NO GROWTH Performed at [facility], [address] [place], [place] [address]    Report Status 08/28/2019 FINAL  Final  Blood Culture (routine x 2)     Status: None   Collection Time: 08/27/19 11:48 PM   Specimen: BLOOD  Result Value Ref Range Status   Specimen Description   Final    BLOOD LEFT WRIST Performed at [facility], [address] [place], [place] [address]    Special  Requests   Final    BOTTLES DRAWN AEROBIC ONLY Blood Culture adequate volume Performed at [facility], [address], [place], [place] [address]    Culture   Final    NO GROWTH 5 DAYS Performed at [facility], [address] [place], [place] [address]    Report Status 09/02/2019 FINAL  Final  Expectorated sputum assessment w rflx to resp cult     Status: None   Collection Time: 08/28/19  9:27 AM   Specimen: Sputum  Result Value Ref Range Status   Specimen Description SPUTUM  Final   Special Requests NONE  Final   Sputum evaluation   Final    Sputum specimen not acceptable for testing.  Please recollect.   INFORMED DANA  ON 9.23.2020 BY [facility] Performed at [facility], [address], [place], [place] [address]    Report Status 08/28/2019 FINAL  Final  Acid Fast Smear (AFB)     Status: None   Collection Time: 08/28/19  2:47 PM   Specimen: Sputum  Result Value Ref Range Status   AFB Specimen Processing Concentration  Final   Acid Fast Smear Negative  Final    Comment: (NOTE) Performed At: [facility] [address] [place], [place] [id number] Jolene Schimke MD [id number]    Source (AFB) SPUTUM  Final    Comment: Performed at [facility], [address], [place], [place] [address]  Acid Fast Smear (AFB)     Status: None   Collection Time: 09/01/19  2:12 AM   Specimen: Sputum  Result Value Ref Range Status   AFB Specimen Processing Concentration  Final   Acid Fast Smear Negative  Final    Comment: (NOTE) Performed At: [facility] [address] [place], [place] [id number] Jolene Schimke MD [id number]    Source (AFB) SPUTUM  Final    Comment: Performed at [facility], [address] [place], [place] [address]     Labs: Basic Metabolic Panel: Recent Labs  Lab 08/31/19 0429 09/03/19 0655  NA 139  --   K 4.5  --   CL 107  --   CO2 23  --   GLUCOSE  174*  --   BUN 17  --   CREATININE 0.46 0.57  CALCIUM 8.4*  --    Liver Function Tests: Recent Labs  Lab 08/31/19 0429  AST 28  ALT 30  ALKPHOS 60  BILITOT 0.2*  PROT 5.4*  ALBUMIN 2.4*   No results for input(s): LIPASE, AMYLASE in the last 168 hours. No results for input(s): AMMONIA in the last 168 hours. CBC: Recent Labs  Lab 08/31/19 0429  WBC 16.3*  HGB 10.5*  HCT 32.8*  MCV 96.2  PLT 371   Cardiac Enzymes: No results for input(s): CKTOTAL, CKMB, CKMBINDEX, TROPONINI in the last 168 hours. BNP: BNP (last 3 results) No results for input(s): BNP in the last 8760 hours.  ProBNP (last 3 results) No results for input(s): PROBNP in the last 8760 hours.  CBG: No results for input(s): GLUCAP in the last 168 hours.     Signed:  Zannie Cove MD.  [facility] 09/05/2019, 3:24 PM

## 2019-09-11 ENCOUNTER — Telehealth: Payer: Self-pay | Admitting: Internal Medicine

## 2019-09-11 MED FILL — IPRATROPIUM BR 0.02% SOLN: 0.02 | 12 days supply | Qty: 313 | Fill #1

## 2019-09-11 MED FILL — GABAPENTIN 300 MG CAPSULE: 300 | 30 days supply | Qty: 30 | Fill #1

## 2019-09-11 MED FILL — !DULERA 100 MCG/5 MCG INH: 100-5 | 30 days supply | Qty: 13 | Fill #1

## 2019-09-11 MED FILL — ALBUTEROL SUL 2.5 MG/3 ML S: (2.5 MG/3ML | 14 days supply | Qty: 180 | Fill #1

## 2019-09-11 MED FILL — PROAIR HFA 90 MCG INHALER: 108 (90 BAS | 30 days supply | Qty: 9 | Fill #1

## 2019-09-11 MED FILL — FOLIC ACID 1 MG TABS: 1 | 30 days supply | Qty: 30 | Fill #0

## 2019-09-11 NOTE — Telephone Encounter (Signed)
Patient called wanting to know when she is due for her next pneumonia shot. Please follow up.

## 2019-09-17 NOTE — Telephone Encounter (Signed)
Medical Assistant left message on patient's home and cell voicemail. Voicemail states to give a call back to [place] with [facility] at [phone]. Patient is aware of receiving pnemovax in 2019 and the next vaccine will not be until she is 84.

## 2019-09-18 ENCOUNTER — Other Ambulatory Visit: Payer: Self-pay | Admitting: Critical Care Medicine

## 2019-09-18 ENCOUNTER — Encounter: Payer: Self-pay | Admitting: Critical Care Medicine

## 2019-09-18 DIAGNOSIS — J449 Chronic obstructive pulmonary disease, unspecified: Secondary | ICD-10-CM

## 2019-09-18 DIAGNOSIS — J189 Pneumonia, unspecified organism: Secondary | ICD-10-CM

## 2019-09-18 MED ORDER — ATROVENT HFA 17 MCG/ACT IN AERS: 2.0000 | INHALATION_SPRAY | Freq: Four times a day (QID) | RESPIRATORY_TRACT | 12 refills | Status: DC

## 2019-09-18 MED ORDER — PREDNISONE 10 MG PO TABS: 5.0000 mg | ORAL_TABLET | Freq: Every day | ORAL | 2 refills | Status: DC

## 2019-09-18 MED ORDER — DULERA 200-5 MCG/ACT IN AERO: 2.0000 | INHALATION_SPRAY | Freq: Two times a day (BID) | RESPIRATORY_TRACT | 3 refills | Status: DC

## 2019-09-18 MED ORDER — ALBUTEROL SULFATE HFA 108 (90 BASE) MCG/ACT IN AERS: 1.0000 | INHALATION_SPRAY | Freq: Four times a day (QID) | RESPIRATORY_TRACT | 6 refills | Status: DC | PRN

## 2019-09-18 NOTE — Progress Notes (Signed)
Med refills

## 2019-09-19 MED FILL — VENTOLIN HFA 90 MCG INHALER: 108 (90 BAS | 25 days supply | Qty: 18 | Fill #0

## 2019-09-19 MED FILL — ATROVENT HFA INHALER: 17 | 25 days supply | Qty: 13 | Fill #0

## 2019-09-19 MED FILL — predniSONE 10 MG TABS: 10 | 10 days supply | Qty: 5 | Fill #0

## 2019-09-19 NOTE — Progress Notes (Signed)
Patient ID: Elizabeth Villarreal, female   DOB: [birth year], 48 y.o.   MRN: [id number]  This is a 48 year old female actually seen previously at community [facility] wellness clinic in September 2019.  Patient has a history of COPD but actually more recently was admitted for 9 days at [facility] for right upper lobe pneumonia and sepsis.  The patient actually left the [place] area and went to live in [place] with a relative but when they lost housing there she returned to the area during the midst of the Covid crisis and was in a quarantine at the remodel and and then from there came to the homeless shelter where she has now been for several weeks.  The patient's issue today is that she is run out of her medications and needs follow-up.  She also needs follow-up on the fact that she has had pneumonia.  Below is a copy of the discharge summary She was admitted September 22nd through October 1 History of present illness:  -48 y.o.femalewithhistory of COPD previous tobacco abuse recently placed on gabapentin for neuropathy which patient has not taken presents to the ER with complaint of fever chills. Patient states her symptoms started this morning and has not had any productive cough until patient reached ER. Has not had any recent travel but has been living in the shelter for last 1 month  Hospital Course:   1. Sepsis/community-acquired pneumonia -Secondary to community-acquired pneumonia, clinically improved significantly with IV ceftriaxone and azithromycin, sputum cultures were negative -COVID-19 PCR is negative -Given upper lobe infiltrates and as she lives in a shelter, a QuantiFERON-TB gold was sent out on admission, this was noted to be in the indeterminant, case was discussed by my partner with infectious disease, subsequently had AFB smears sent which were negative -Hence no further work-up was not felt to be indicated  2.  COPD exacerbation, pneumonia  -Clinically improved as above,  transition to prednisone taper and oral antibiotics to complete course -Recommend follow-up chest x-ray in 4 to 6 weeks  3.  Peripheral neuropathy, continue gabapentin  4.  Tobacco abuse -Quit smoking 2 years ago, counseled   Currently her shortness of breath is improved.  She is not able to use the nebulizer and homeless shelter because of aerosolization is not asking for an HFA substitute.  She is off prednisone.  She states prednisone has been helpful to her.   On exam blood pressure is 118/72 temps 97 saturation 96% room air pulse 80 chest show distant breath sounds without wheeze rale or rhonchi cardiac exam showed a regular rate rhythm without S3-S4 normal S1-S2 abdomen soft nontender extremities show no edema  Impression is that of COPD asthmatic bronchitic component  I will refill the patient's prednisone at a dose of 5 mg daily we will also refill her Dulera 2 but increase the strength to 200 mcg at 2 puffs twice daily as well she will receive an Atrovent inhaler at 2 puffs 4 times daily in place of her ipratropium minutes in the nebulizer   We will also establish her back into the health and wellness clinic and obtain a chest x-ray as well to follow-up the right upper lobe pneumonia to ensure it has cleared

## 2019-09-27 ENCOUNTER — Other Ambulatory Visit: Payer: Self-pay

## 2019-09-27 DIAGNOSIS — Z20822 Contact with and (suspected) exposure to covid-19: Secondary | ICD-10-CM

## 2019-09-29 LAB — NOVEL CORONAVIRUS, NAA: SARS-CoV-2, NAA: NOT DETECTED

## 2019-10-04 ENCOUNTER — Other Ambulatory Visit: Payer: Self-pay

## 2019-10-04 ENCOUNTER — Ambulatory Visit (HOSPITAL_COMMUNITY)
Admission: RE | Admit: 2019-10-04 | Discharge: 2019-10-04 | Disposition: A | Discharge status: Home / Self Care | Payer: Medicaid Other | Source: Ambulatory Visit | Attending: Critical Care Medicine | Admitting: Critical Care Medicine

## 2019-10-04 DIAGNOSIS — J449 Chronic obstructive pulmonary disease, unspecified: Secondary | ICD-10-CM | POA: Insufficient documentation

## 2019-10-04 DIAGNOSIS — J189 Pneumonia, unspecified organism: Secondary | ICD-10-CM | POA: Insufficient documentation

## 2019-10-06 NOTE — Progress Notes (Signed)
Subjective:    Patient ID: Elizabeth Villarreal, female    DOB: [birth year], 48 y.o.   MRN: [id number]  This is a 48 year old female actually seen previously at community health and wellness clinic in September 2019.  Patient has a history of COPD but actually more recently was admitted for 9 days at [facility] for right upper lobe pneumonia and sepsis.  The patient actually left the [place] area and went to live in [place] with a relative but when they lost housing there she returned to the area during the midst of the Covid crisis and was in a quarantine at the remodel and and then from there came to the homeless shelter where she has now been for several weeks.  The patient's issue today is that she is run out of her medications and needs follow-up.  She also needs follow-up on the fact that she has had pneumonia.  Below is a copy of the discharge summary She was admitted September 22nd through October 1 History of present illness: -48 y.o.femalewithhistory of COPD previous tobacco abuse recently placed on gabapentin for neuropathy which patient has not taken presents to the ER with complaint of fever chills. Patient states her symptoms started this morning and has not had any productive cough until patient reached ER. Has not had any recent travel but has been living in the shelter for last 1 month  Hospital Course:  1. Sepsis/community-acquired pneumonia -Secondary to community-acquired pneumonia, clinically improved significantly with IV ceftriaxone and azithromycin, sputum cultures were negative -COVID-19 PCR is negative -Given upper lobe infiltrates and as she lives in a shelter,a QuantiFERON-TB gold was sent out on admission, this was noted to be in the indeterminant, case was discussed by my partner with infectious disease, subsequently had AFB smears sent which were negative -Hence no further work-up was not felt to be indicated  2.COPD exacerbation, pneumonia    -Clinically improved as above, transition to prednisone taper and oral antibiotics to complete course -Recommend follow-up chest x-ray in 4 to 6 weeks  3.Peripheral neuropathy, continue gabapentin  4.Tobacco abuse -Quit smoking 2 years ago, counseled   Currently her shortness of breath is improved.  She is not able to use the nebulizer and homeless shelter because of aerosolization is not asking for an HFA substitute.  She is off prednisone.  She states prednisone has been helpful to her.   On exam blood pressure is 118/72 temps 97 saturation 96% room air pulse 80 chest show distant breath sounds without wheeze rale or rhonchi cardiac exam showed a regular rate rhythm without S3-S4 normal S1-S2 abdomen soft nontender extremities show no edema  Impression is that of COPD asthmatic bronchitic component  I will refill the patient's prednisone at a dose of 5 mg daily we will also refill her Dulera 2 but increase the strength to 200 mcg at 2 puffs twice daily as well she will receive an Atrovent inhaler at 2 puffs 4 times daily in place of her ipratropium minutes in the nebulizer   We will also establish her back into the health and wellness clinic and obtain a chest x-ray as well to follow-up the right upper lobe pneumonia to ensure it has cleared   10/07/2019 The patient returns to reestablish in the clinic after having moved away to [place] for a 78-month interval having returned to this area 2 months ago.  The patient currently is homeless and staying at the [facility].  She recently was admitted as  noted above for COPD exacerbation.  We then saw her back in the homeless shelter clinic and restarted the [facility] and Atrovent inhalers and the patient finished her course of prednisone and now is maintaining 5 mg daily of prednisone as a maintenance.  The patient states her breathing is improved.  She is not smoked in 2 years.  She had a repeat chest x-ray which  showed improvement in the right upper lobe pneumonia.  There is still some small cavitary change in the right upper lobe and left lower lobe nodule seen.  The patient needs a repeat CT scan of the chest for the same.  Currently there is no cough there is some chest tightness and soreness behind the shoulder blades.  She is still working on housing.  Note during the hospitalization the patient did receive a flu vaccine   Past Medical History:  Diagnosis Date   CAP (community acquired pneumonia) 08/27/2019   COPD (chronic obstructive pulmonary disease) ([place])    Depression    Tobacco abuse      Family History  Problem Relation Age of Onset   COPD Mother    Lung cancer Father      Social History   Socioeconomic History   Marital status: Single    Spouse name: Not on file   Number of children: Not on file   Years of education: Not on file   Highest education level: Not on file  Occupational History   Not on file  Social Needs   Financial resource strain: Not on file   Food insecurity    Worry: Not on file    Inability: Not on file   Transportation needs    Medical: Not on file    Non-medical: Not on file  Tobacco Use   Smoking status: Former Smoker    Packs/day: 1.00    Types: Cigarettes    Quit date: 01/15/2018    Years since quitting: 1.7   Smokeless tobacco: Never Used  Substance and Sexual Activity   Alcohol use: No   Drug use: No   Sexual activity: Not Currently    Birth control/protection: None  Lifestyle   Physical activity    Days per week: Not on file    Minutes per session: Not on file   Stress: Not on file  Relationships   Social connections    Talks on phone: Not on file    Gets together: Not on file    Attends religious service: Not on file    Active member of club or organization: Not on file    Attends meetings of clubs or organizations: Not on file    Relationship status: Not on file   Intimate partner violence    Fear of  current or ex partner: Not on file    Emotionally abused: Not on file    Physically abused: Not on file    Forced sexual activity: Not on file  Other Topics Concern   Not on file  Social History Narrative   Not on file     Allergies  Allergen Reactions   Darvon [Propoxyphene] Other (See Comments)    Patient was aware her spoken words were not matching her thoughts     Outpatient Medications Prior to Visit  Medication Sig Dispense Refill   acetaminophen (TYLENOL) 325 MG tablet Take 650 mg by mouth every 6 (six) hours as needed for mild pain.     albuterol (PROAIR HFA) 108 (90 Base) MCG/ACT inhaler Inhale  1-2 puffs into the lungs every 6 (six) hours as needed for wheezing or shortness of breath. 18 g 6   albuterol (PROVENTIL) (2.5 MG/3ML) 0.083% nebulizer solution USE ONE VIAL VIA NEBULIZER EVERY SIX HOURS AS NEEDED FOR WHEEZING OR SHORTNESS OF BREATH WITH THE IPRATROPIUM (Patient taking differently: 2.5 mg every 6 (six) hours as needed for wheezing or shortness of breath. WITH THE IPRATROPIUM) 375 mL 2   ipratropium (ATROVENT HFA) 17 MCG/ACT inhaler Inhale 2 puffs into the lungs every 6 (six) hours. 1 Inhaler 12   mometasone-formoterol (DULERA) 200-5 MCG/ACT AERO Inhale 2 puffs into the lungs 2 (two) times daily. 13 g 3   vitamin B-12 (CYANOCOBALAMIN) 500 MCG tablet Take 1 tablet (500 mcg total) by mouth daily. 90 tablet 1   folic acid (FOLVITE) 1 MG tablet Take 1 tablet (1 mg total) by mouth daily. 90 tablet 1   gabapentin (NEURONTIN) 300 MG capsule Take 1 capsule (300 mg total) by mouth at bedtime. 30 capsule 3   ipratropium (ATROVENT) 0.02 % nebulizer solution Take 2.5 mLs (0.5 mg total) by nebulization 4 (four) times daily. (Patient not taking: Reported on 10/07/2019) 300 mL 2   No facility-administered medications prior to visit.     Review of Systems Constitutional:   No  weight loss, night sweats,  Fevers, chills, fatigue, lassitude. HEENT:   No headaches,   Difficulty swallowing,  Tooth/dental problems,  Sore throat,                No sneezing, itching, ear ache, nasal congestion, post nasal drip,   CV:  No chest pain,  Orthopnea, PND, swelling in lower extremities, anasarca, dizziness, palpitations  GI  No heartburn, indigestion, abdominal pain, nausea, vomiting, diarrhea, change in bowel habits, loss of appetite  Resp:  shortness of breath with exertion or at rest.  No excess mucus, no productive cough,  No non-productive cough,  No coughing up of blood.  No change in color of mucus.  No wheezing.  No chest wall deformity  Skin: no rash or lesions.  GU: no dysuria, change in color of urine, no urgency or frequency.  No flank pain.  MS:  No joint pain or swelling.  No decreased range of motion.  No back pain.  Psych:  No change in mood or affect. No depression or anxiety.  No memory loss.     Objective:   Physical Exam Vitals:   10/07/19 0857  BP: 122/80  Pulse: 88  Temp: 97.7 F (36.5 C)  TempSrc: Oral  SpO2: 95%  Weight: 137 lb 6.4 oz (62.3 kg)  Height: 5\' 8"  (1.727 m)    Gen: Pleasant, well-nourished, in no distress,  normal affect  ENT: No lesions,  mouth clear,  oropharynx clear, no postnasal drip  Neck: No JVD, no TMG, no carotid bruits  Lungs: No use of accessory muscles, no dullness to percussion, hyperresonance to percussion and decreased breath sounds  Cardiovascular: RRR, heart sounds normal, no murmur or gallops, no peripheral edema  Abdomen: soft and NT, no HSM,  BS normal  Musculoskeletal: No deformities, no cyanosis or clubbing  Neuro: alert, non focal  Skin: Warm, no lesions or rashes   CXR 10/30: IMPRESSION: 1. Near complete resolution of right upper lobe pneumonia. Small somewhat irregular opacity remains in the right upper lobe, potentially with central cavitation. One additional short interval radiographic follow-up could be performed to assess for resolution, alternatively chest CT could be  obtained given underlying emphysema. 2. Small  focal opacity at the lingula, atelectasis versus minimal infiltrate 3. Hyperinflated lungs with emphysema       Assessment & Plan:  I personally reviewed all images and lab data in the [facility] system as well as any outside material available during this office visit and agree with the  radiology impressions.   CAP (community acquired pneumonia) Right lung community-acquired pneumonia now resolved on x-ray except for residual small cavitary change right upper lobe  No additional antibiotics indicated  COPD GOLD D  with chronic bronchitis (HCC) Gold stage D COPD with pulmonary functions in March 2019 showing FEV1 28% predicted diffusion capacity 30% predicted  We will continue the [facility] and Atrovent inhalers as prescribed with as needed albuterol  Lung nodule seen on imaging study Left lower lobe lung nodule seen will need follow-up CT scanning   Diagnoses and all orders for this visit:  COPD GOLD D  with chronic bronchitis (HCC) -     CBC with Differential/Platelet; Future -     Comprehensive metabolic panel -     CBC with Differential/Platelet  Neuropathic pain of both feet -     Discontinue: gabapentin (NEURONTIN) 300 MG capsule; Take 1 capsule (300 mg total) by mouth at bedtime. -     gabapentin (NEURONTIN) 300 MG capsule; Take 1 capsule (300 mg total) by mouth at bedtime.  Abnormal findings on diagnostic imaging of lung -     CT Chest Wo Contrast; Future  Community acquired pneumonia of right upper lobe of lung -     CBC with Differential/Platelet; Future -     CBC with Differential/Platelet  Lung nodule seen on imaging study  Other orders -     folic acid (FOLVITE) 1 MG tablet; Take 1 tablet (1 mg total) by mouth daily.

## 2019-10-07 ENCOUNTER — Telehealth: Payer: Self-pay

## 2019-10-07 ENCOUNTER — Encounter: Payer: Self-pay | Admitting: Critical Care Medicine

## 2019-10-07 ENCOUNTER — Ambulatory Visit: Payer: Medicaid Other | Attending: Critical Care Medicine | Admitting: Critical Care Medicine

## 2019-10-07 ENCOUNTER — Other Ambulatory Visit: Payer: Self-pay

## 2019-10-07 ENCOUNTER — Ambulatory Visit (HOSPITAL_BASED_OUTPATIENT_CLINIC_OR_DEPARTMENT_OTHER): Payer: Medicaid Other | Admitting: Licensed Clinical Social Worker

## 2019-10-07 VITALS — BP 122/80 | HR 88 | Temp 97.7°F | Ht 68.0 in | Wt 137.4 lb

## 2019-10-07 DIAGNOSIS — J449 Chronic obstructive pulmonary disease, unspecified: Secondary | ICD-10-CM | POA: Diagnosis not present

## 2019-10-07 DIAGNOSIS — Z599 Problem related to housing and economic circumstances, unspecified: Secondary | ICD-10-CM

## 2019-10-07 DIAGNOSIS — R911 Solitary pulmonary nodule: Secondary | ICD-10-CM

## 2019-10-07 DIAGNOSIS — G5793 Unspecified mononeuropathy of bilateral lower limbs: Secondary | ICD-10-CM

## 2019-10-07 DIAGNOSIS — Z598 Other problems related to housing and economic circumstances: Secondary | ICD-10-CM

## 2019-10-07 DIAGNOSIS — Z5900 Homelessness unspecified: Secondary | ICD-10-CM

## 2019-10-07 DIAGNOSIS — R918 Other nonspecific abnormal finding of lung field: Secondary | ICD-10-CM

## 2019-10-07 DIAGNOSIS — J189 Pneumonia, unspecified organism: Secondary | ICD-10-CM

## 2019-10-07 DIAGNOSIS — J4489 Other specified chronic obstructive pulmonary disease: Secondary | ICD-10-CM

## 2019-10-07 MED ORDER — FOLIC ACID 1 MG PO TABS: 1.0000 mg | ORAL_TABLET | Freq: Every day | ORAL | 1 refills | Status: DC

## 2019-10-07 MED ORDER — GABAPENTIN 300 MG PO CAPS: 300.0000 mg | ORAL_CAPSULE | Freq: Every day | ORAL | 3 refills | Status: DC

## 2019-10-07 MED FILL — DULERA 100 MCG/5 MCG INH: 100-5 | 30 days supply | Qty: 13 | Fill #2

## 2019-10-07 MED FILL — GABAPENTIN 300 MG CAPSULE: 300 | 30 days supply | Qty: 30 | Fill #0

## 2019-10-07 MED FILL — FOLIC ACID 1 MG TABS: 1 | 30 days supply | Qty: 30 | Fill #0

## 2019-10-07 NOTE — Assessment & Plan Note (Signed)
Left lower lobe lung nodule seen will need follow-up CT scanning

## 2019-10-07 NOTE — Assessment & Plan Note (Signed)
Gold stage D COPD with pulmonary functions in March 2019 showing FEV1 28% predicted diffusion capacity 30% predicted  We will continue the [facility] and Atrovent inhalers as prescribed with as needed albuterol

## 2019-10-07 NOTE — Telephone Encounter (Signed)
Met with patient when Elizabeth Villarreal was in the clinic today for her appointment. Elizabeth See, LCSW also present.  Patient continues to stay at [facility] but is not sure how long Elizabeth Villarreal is permitted to stay there.  Elizabeth Villarreal now has [place] Medicaid and receives her SSI - $783/month.Elizabeth Villarreal is having issues accessing her SSA account remotely but is working with [employer] to correct the situation. Elizabeth Villarreal said that her money is deposited as scheduled and Elizabeth Villarreal can access the money Elizabeth Villarreal is just not able to make changes with her account.    Elizabeth Villarreal would like to move into her own apartment/room but does not have any money at the present time. Elizabeth Villarreal explained that most of her income goes toward car payments/repairs/ insurance and her storage unit.  Elizabeth Villarreal understands the need to save money for housing.  Elizabeth Villarreal said that Elizabeth Villarreal met with an intern at [employer] who provided her with a list of affordable housing and Elizabeth Villarreal has contacted the landlords but has not found anything yet  This CM provided her with another list of properties from [employer] and Elizabeth Villarreal said that Elizabeth Villarreal would make some calls to check availability.   Elizabeth Villarreal expressed desire to work at some point but Elizabeth Villarreal does not want to exceed the allowed limit to keep her disability. Instructed her to discuss her thoughts about with with her provider.  Call placed to [facility], case [employer].  Elizabeth Villarreal explained that they do not find housing for individuals and Elizabeth Villarreal confirmed that they provided the patient with a list of affordable housing options.  The patient will need to make the calls herself to check availability. Elizabeth Villarreal also noted that there is no plan at present time that the patient needs to leave [facility]

## 2019-10-07 NOTE — Patient Instructions (Addendum)
No change in your medications at this time  I sent refills on the gabapentin, folic acid and albuterol inhaler to our pharmacy and you have existing refills on all your other medicines at our pharmacy  A CT scan of the chest without contrast will be scheduled to evaluate your lung nodules in 2 weeks  Labs today include a complete blood count and complete metabolic panel  We connected you with our clinical case manager Opal Sidles today to discuss your housing situation   We will schedule a video visit using [employer] in mid December with Dr. Joya Gaskins

## 2019-10-07 NOTE — Assessment & Plan Note (Signed)
Right lung community-acquired pneumonia now resolved on x-ray except for residual small cavitary change right upper lobe  No additional antibiotics indicated

## 2019-10-08 LAB — CBC WITH DIFFERENTIAL/PLATELET
Basophils Absolute: 0.1 10*3/uL (ref 0.0–0.2)
Basos: 1 %
EOS (ABSOLUTE): 0 10*3/uL (ref 0.0–0.4)
Eos: 0 %
Hematocrit: 43.3 % (ref 34.0–46.6)
Hemoglobin: 14.2 g/dL (ref 11.1–15.9)
Immature Grans (Abs): 0 10*3/uL (ref 0.0–0.1)
Immature Granulocytes: 0 %
Lymphocytes Absolute: 1.4 10*3/uL (ref 0.7–3.1)
Lymphs: 21 %
MCH: 30.6 pg (ref 26.6–33.0)
MCHC: 32.8 g/dL (ref 31.5–35.7)
MCV: 93 fL (ref 79–97)
Monocytes Absolute: 0.4 10*3/uL (ref 0.1–0.9)
Monocytes: 6 %
Neutrophils Absolute: 4.8 10*3/uL (ref 1.4–7.0)
Neutrophils: 72 %
Platelets: 386 10*3/uL (ref 150–450)
RBC: 4.64 x10E6/uL (ref 3.77–5.28)
RDW: 15.2 % (ref 11.7–15.4)
WBC: 6.8 10*3/uL (ref 3.4–10.8)

## 2019-10-08 LAB — COMPREHENSIVE METABOLIC PANEL
ALT: 14 IU/L (ref 0–32)
AST: 22 IU/L (ref 0–40)
Albumin/Globulin Ratio: 2.2 (ref 1.2–2.2)
Albumin: 4.6 g/dL (ref 3.8–4.8)
Alkaline Phosphatase: 52 IU/L (ref 39–117)
BUN/Creatinine Ratio: 16 (ref 9–23)
BUN: 10 mg/dL (ref 6–24)
Bilirubin Total: <0.2 mg/dL (ref 0.0–1.2)
CO2: 25 mmol/L (ref 20–29)
Calcium: 9.7 mg/dL (ref 8.7–10.2)
Chloride: 103 mmol/L (ref 96–106)
Creatinine, Ser: 0.63 mg/dL (ref 0.57–1.00)
GFR calc Af Amer: 123 mL/min/{1.73_m2} (ref >59)
GFR calc non Af Amer: 106 mL/min/{1.73_m2} (ref >59)
Globulin, Total: 2.1 g/dL (ref 1.5–4.5)
Glucose: 52 mg/dL — ABNORMAL LOW (ref 65–99)
Potassium: 4.6 mmol/L (ref 3.5–5.2)
Sodium: 141 mmol/L (ref 134–144)
Total Protein: 6.7 g/dL (ref 6.0–8.5)

## 2019-10-08 NOTE — BH Specialist Note (Addendum)
Integrated Behavioral Health Initial Visit  MRN: [id number] Name: Elizabeth Villarreal  Number of [place] Clinician visits:: 1/6 Session Start time: 9:20 AM  Session End time: 9:30 AM Total time: 10 minutes  Type of Service: [place] Interpretor:No. Interpretor Name and Language: NA   Warm Hand Off Completed.       SUBJECTIVE: Elizabeth Villarreal is a 48 y.o. female accompanied by self Patient was referred by Dr. Joya Gaskins for psychosocial stressors. Patient reports the following symptoms/concerns: Pt reports that she recently returned from [facility]. She is currently homeless residing at [employer]. Pt reports increase stress triggered by financial strain. 70 of pt's monthly income goes toward car repairs preventing pt from saving towards independent housing Duration of problem: Ongoing; Severity of problem: moderate  OBJECTIVE: Mood: Anxious and Affect: Appropriate Risk of harm to self or others: No plan to harm self or others  LIFE CONTEXT: Family and Social: Pt receives very limited support. Shared she has established friends at shelter and has spoke with social work Theatre manager regarding plan to obtain housing School/Work: Pt receives disability and has medicaid. She is interested in obtaining information regarding working part-time to assist with financial strain Self-Care: Pt reports making to-do lists to assist in managing priorities and promoting feelings of effectiveness  Life Changes: Pt is experiencing financial strain. She resides at a local shelter and is interested in obtaining independent housing  GOALS ADDRESSED: Patient will: 1. Reduce symptoms of: stress 2. Increase knowledge and/or ability of: self-management skills  3. Demonstrate ability to: Increase healthy adjustment to current life circumstances and Increase adequate support systems for patient/family  INTERVENTIONS: Interventions utilized: Solution-Focused Strategies and  Supportive Counseling  Standardized Assessments completed: GAD-7 and PHQ 2&9  ASSESSMENT: Patient reports that she recently returned from [facility]. She is currently homeless residing at [employer]. Pt reports increase stress triggered by financial strain. 21 of pt's monthly income goes toward car repairs preventing pt from saving towards independent housing.    LCSW provided support and encouragement. Pt agreed to referral to Legal Aid to provide information regarding income limits if she should obtain part-time employment. RN CM provided pt with updated listing of housing resources from [employer]  Legal Aid referral submitted via fax  PLAN: 1. Follow up with behavioral health clinician on : Contact LCSW with any additional behavioral health and/or resource needs 2. Behavioral recommendations: Utilize resources provided and follow up with Legal Aid  3. Referral(s): [facility] (In Clinic) and [employer] Resources:  Legal Aid referral 4. "From scale of 1-10, how likely are you to follow plan?":   Rebekah Chesterfield, LCSW 10/08/2019 3:48 PM

## 2019-10-09 ENCOUNTER — Telehealth: Payer: Self-pay | Admitting: Internal Medicine

## 2019-10-09 NOTE — Telephone Encounter (Signed)
Kylie from [facility] called to request prior auth for a CT scan scheduled for 10/11/2019, please follow up as soon as possible and give Kylie a confirmation call when completed.  [phone]  Ext [phone]

## 2019-10-10 MED FILL — !VENTOLIN HFA INHALER: 108 (90 BAS | 25 days supply | Qty: 18 | Fill #1

## 2019-10-10 MED FILL — ATROVENT HFA INHALER: 17 | 25 days supply | Qty: 13 | Fill #1

## 2019-10-11 ENCOUNTER — Ambulatory Visit (HOSPITAL_COMMUNITY): Payer: Medicaid Other

## 2019-10-11 LAB — ACID FAST CULTURE WITH REFLEXED SENSITIVITIES (MYCOBACTERIA): Acid Fast Culture: NEGATIVE

## 2019-10-11 NOTE — Telephone Encounter (Signed)
Elizabeth Villarreal can you work on [employer] please

## 2019-10-16 LAB — ACID FAST CULTURE WITH REFLEXED SENSITIVITIES (MYCOBACTERIA): Acid Fast Culture: NEGATIVE

## 2019-10-17 ENCOUNTER — Ambulatory Visit (HOSPITAL_COMMUNITY)
Admission: RE | Admit: 2019-10-17 | Discharge: 2019-10-17 | Disposition: A | Discharge status: Home / Self Care | Payer: Medicaid Other | Source: Ambulatory Visit | Attending: Critical Care Medicine | Admitting: Critical Care Medicine

## 2019-10-17 ENCOUNTER — Other Ambulatory Visit: Payer: Self-pay

## 2019-10-17 DIAGNOSIS — R918 Other nonspecific abnormal finding of lung field: Secondary | ICD-10-CM

## 2019-10-23 ENCOUNTER — Other Ambulatory Visit (HOSPITAL_COMMUNITY)
Admission: RE | Admit: 2019-10-23 | Discharge: 2019-10-23 | Disposition: A | Discharge status: Home / Self Care | Payer: Medicaid Other | Source: Ambulatory Visit | Attending: Obstetrics and Gynecology | Admitting: Obstetrics and Gynecology

## 2019-10-23 ENCOUNTER — Encounter: Payer: Self-pay | Admitting: Obstetrics and Gynecology

## 2019-10-23 ENCOUNTER — Ambulatory Visit (INDEPENDENT_AMBULATORY_CARE_PROVIDER_SITE_OTHER): Payer: Medicaid Other | Admitting: Obstetrics and Gynecology

## 2019-10-23 ENCOUNTER — Other Ambulatory Visit: Payer: Self-pay

## 2019-10-23 VITALS — BP 107/73 | HR 69 | Wt 134.0 lb

## 2019-10-23 DIAGNOSIS — N898 Other specified noninflammatory disorders of vagina: Secondary | ICD-10-CM

## 2019-10-23 DIAGNOSIS — N840 Polyp of corpus uteri: Secondary | ICD-10-CM | POA: Diagnosis not present

## 2019-10-23 DIAGNOSIS — Z0000 Encounter for general adult medical examination without abnormal findings: Secondary | ICD-10-CM | POA: Diagnosis present

## 2019-10-23 DIAGNOSIS — N939 Abnormal uterine and vaginal bleeding, unspecified: Secondary | ICD-10-CM | POA: Diagnosis not present

## 2019-10-23 DIAGNOSIS — Z113 Encounter for screening for infections with a predominantly sexual mode of transmission: Secondary | ICD-10-CM | POA: Insufficient documentation

## 2019-10-23 DIAGNOSIS — Z01419 Encounter for gynecological examination (general) (routine) without abnormal findings: Secondary | ICD-10-CM

## 2019-10-23 DIAGNOSIS — Z1231 Encounter for screening mammogram for malignant neoplasm of breast: Secondary | ICD-10-CM | POA: Diagnosis not present

## 2019-10-23 NOTE — Progress Notes (Signed)
ENDOMETRIAL BIOPSY      Elizabeth Villarreal is a 48 y.o. G4P4000 here for endometrial biopsy.  The indications for endometrial biopsy were reviewed.  Risks of the biopsy including cramping, bleeding, infection, uterine perforation, inadequate specimen and need for additional procedures were discussed. The patient states she understands and agrees to undergo procedure today. Consent was signed. Time out was performed.   Indications: AUB, watery bleeding Urine HCG: negative  A bivalve speculum was placed into the vagina and the cervix was easily visualized and was prepped with Betadine x2. A single-toothed tenaculum was placed on the anterior lip of the cervix to stabilize it. The 3 mm pipelle was introduced into the endometrial cavity without difficulty to a depth of 8 cm, and a moderate amount of tissue was obtained and sent to pathology. This was repeated for a total of 3 passes. The instruments were removed from the patient's vagina. Minimal bleeding from the cervix at the tenaculum was noted.   The patient tolerated the procedure well. Routine post-procedure instructions were given to the patient.    Will base further management on results of biopsy.  Feliz Beam, M.D. Attending Center for [employer] Fish farm manager)

## 2019-10-23 NOTE — Progress Notes (Signed)
Vaginal lesion Irregular menses Not sure if menopause or something else States some weird stuff going on and spotting and cycle not coming regularly.

## 2019-10-23 NOTE — Progress Notes (Signed)
GYNECOLOGY ANNUAL PREVENTATIVE CARE ENCOUNTER NOTE  Subjective:   Elizabeth Villarreal is a 48 y.o. G45P4 female here for a annual gynecologic exam. Current complaints: vaginal lesion. Has had irregular bleeding as well.  Her PCP noted a vaginal wall lesion on her physical, denies itching, burning, bleeding from bleeding.  Has also had irregular bleeding. Periods are usually heavy and long. Lately, she has had watery, spotting throughout the month, this has been going on throughout the last 3 weeks.   Denies discharge, pelvic pain, problems with intercourse or other gynecologic concerns.    Gynecologic History Patient's last menstrual period was 10/01/2019 (approximate). Contraception: none Last Pap: 05/17/18 Results were: negative Last mammogram: n/a  Obstetric History OB History  Gravida Para Term Preterm AB Living  4 4 4         SAB TAB Ectopic Multiple Live Births               # Outcome Date GA Lbr Len/2nd Weight Sex Delivery Anes PTL Lv  4 Term      Vag-Spont     3 Term      Vag-Spont     2 Term      Vag-Spont     1 Term      CS-Unspec       Past Medical History:  Diagnosis Date   CAP (community acquired pneumonia) 08/27/2019   COPD (chronic obstructive pulmonary disease) ([place])    Depression    Tobacco abuse     Past Surgical History:  Procedure Laterality Date   CESAREAN SECTION     ECTOPIC PREGNANCY SURGERY     ovarian cyst rupture     ovary cyst rupture      Current Outpatient Medications on File Prior to Visit  Medication Sig Dispense Refill   acetaminophen (TYLENOL) 325 MG tablet Take 650 mg by mouth every 6 (six) hours as needed for mild pain.     albuterol (PROAIR HFA) 108 (90 Base) MCG/ACT inhaler Inhale 1-2 puffs into the lungs every 6 (six) hours as needed for wheezing or shortness of breath. 18 g 6   albuterol (PROVENTIL) (2.5 MG/3ML) 0.083% nebulizer solution USE ONE VIAL VIA NEBULIZER EVERY SIX HOURS AS NEEDED FOR WHEEZING OR SHORTNESS OF BREATH  WITH THE IPRATROPIUM (Patient taking differently: 2.5 mg every 6 (six) hours as needed for wheezing or shortness of breath. WITH THE IPRATROPIUM) 329 mL 2   folic acid (FOLVITE) 1 MG tablet Take 1 tablet (1 mg total) by mouth daily. 90 tablet 1   gabapentin (NEURONTIN) 300 MG capsule Take 1 capsule (300 mg total) by mouth at bedtime. 30 capsule 3   ipratropium (ATROVENT HFA) 17 MCG/ACT inhaler Inhale 2 puffs into the lungs every 6 (six) hours. 1 Inhaler 12   ipratropium (ATROVENT) 0.02 % nebulizer solution Take 2.5 mLs (0.5 mg total) by nebulization 4 (four) times daily. 300 mL 2   mometasone-formoterol (DULERA) 200-5 MCG/ACT AERO Inhale 2 puffs into the lungs 2 (two) times daily. 13 g 3   vitamin B-12 (CYANOCOBALAMIN) 500 MCG tablet Take 1 tablet (500 mcg total) by mouth daily. 90 tablet 1   No current facility-administered medications on file prior to visit.     Allergies  Allergen Reactions   Darvon [Propoxyphene] Other (See Comments)    Patient was aware her spoken words were not matching her thoughts    Social History   Socioeconomic History   Marital status: Single    Spouse name: Not  on file   Number of children: Not on file   Years of education: Not on file   Highest education level: Not on file  Occupational History   Not on file  Social Needs   Financial resource strain: Not on file   Food insecurity    Worry: Not on file    Inability: Not on file   Transportation needs    Medical: Not on file    Non-medical: Not on file  Tobacco Use   Smoking status: Former Smoker    Packs/day: 1.00    Types: Cigarettes    Quit date: 01/15/2018    Years since quitting: 1.7   Smokeless tobacco: Never Used  Substance and Sexual Activity   Alcohol use: No   Drug use: No   Sexual activity: Not Currently    Birth control/protection: None  Lifestyle   Physical activity    Days per week: Not on file    Minutes per session: Not on file   Stress: Not on file    Relationships   Social connections    Talks on phone: Not on file    Gets together: Not on file    Attends religious service: Not on file    Active member of club or organization: Not on file    Attends meetings of clubs or organizations: Not on file    Relationship status: Not on file   Intimate partner violence    Fear of current or ex partner: Not on file    Emotionally abused: Not on file    Physically abused: Not on file    Forced sexual activity: Not on file  Other Topics Concern   Not on file  Social History Narrative   Not on file    Family History  Problem Relation Age of Onset   COPD Mother    Lung cancer Father    The following portions of the patient's history were reviewed and updated as appropriate: allergies, current medications, past family history, past medical history, past social history, past surgical history and problem list.  Review of Systems Pertinent items are noted in HPI.   Objective:  BP 107/73    Pulse 69    Wt 134 lb (60.8 kg)    LMP 10/01/2019 (Approximate)    BMI 20.37 kg/m  CONSTITUTIONAL: Well-developed, well-nourished female in no acute distress.  HENT:  Normocephalic, atraumatic, External right and left ear normal. Oropharynx is clear and moist EYES: Conjunctivae and EOM are normal. Pupils are equal, round, and reactive to light. No scleral icterus.  NECK: Normal range of motion, supple, no masses.  Normal thyroid.  SKIN: Skin is warm and dry. No rash noted. Not diaphoretic. No erythema. No pallor. NEUROLOGIC: Alert and oriented to person, place, and time. Normal reflexes, muscle tone coordination. No cranial nerve deficit noted. PSYCHIATRIC: Normal mood and affect. Normal behavior. Normal judgment and thought content. CARDIOVASCULAR: Normal heart rate noted, regular rhythm RESPIRATORY: Clear to auscultation bilaterally. Effort and breath sounds normal, no problems with respiration noted. BREASTS: deferred ABDOMEN: Soft, normal  bowel sounds, no distention noted.  No tenderness, rebound or guarding.  PELVIC: Normal appearing external genitalia with seven 2 mm hemangiomas spread across the vulva; normal appearing vaginal mucosa and cervix.  No abnormal discharge noted.  Pap smear obtained.  Normal uterine size, no other palpable masses, no uterine or adnexal tenderness. MUSCULOSKELETAL: Normal range of motion. No tenderness.  No cyanosis, clubbing, or edema.  2+ distal pulses.  Exam  done with chaperone present.  Assessment and Plan:   1. Well woman exam - Cytology - PAP( [place])  2. Abnormal uterine bleeding (AUB) - recommend EMB given description of "watery bleeding", patient agreeable See procedure note for EMB details - POCT urine pregnancy - Cervicovaginal ancillary only( [place]) - Surgical pathology( [place]/ [facility])  3. Vaginal lesion No abnormal intra-vaginal lesions noted Several hemangiomas of the vulva noted, usually not seen in this location so will plan for her to return for biopsy  4. Routine screening for STI (sexually transmitted infection) HIV recently done - Hepatitis B surface antigen - Hepatitis C Antibody - RPR - Cervicovaginal ancillary only( [place])  5. Encounter for screening mammogram for malignant neoplasm of breast - Mammogram Screening Bilateral Tomo; Future  6. Vaginal discharge Watery bloody discharge STI screen   Will follow up results of pap smear/STI screen and manage accordingly. Encouraged improvement in diet and exercise.  Mammogram ordered today Flu vaccine UTD  Routine preventative health maintenance measures emphasized. Please refer to After Visit Summary for other counseling recommendations.   Total face-to-face time with patient: 40 minutes. Over 50% of encounter was spent on counseling and coordination of care.   Baldemar Lenis, M.D. Attending [facility])

## 2019-10-24 ENCOUNTER — Other Ambulatory Visit: Payer: Self-pay | Admitting: Pharmacist

## 2019-10-24 ENCOUNTER — Encounter: Payer: Self-pay | Admitting: Internal Medicine

## 2019-10-24 ENCOUNTER — Ambulatory Visit: Payer: Medicaid Other | Attending: Internal Medicine | Admitting: Internal Medicine

## 2019-10-24 DIAGNOSIS — J449 Chronic obstructive pulmonary disease, unspecified: Secondary | ICD-10-CM

## 2019-10-24 DIAGNOSIS — Z5900 Homelessness unspecified: Secondary | ICD-10-CM

## 2019-10-24 DIAGNOSIS — N939 Abnormal uterine and vaginal bleeding, unspecified: Secondary | ICD-10-CM

## 2019-10-24 LAB — HEPATITIS C ANTIBODY: Hep C Virus Ab: <0.1 s/co ratio (ref 0.0–0.9)

## 2019-10-24 LAB — HEPATITIS B SURFACE ANTIGEN: Hepatitis B Surface Ag: NEGATIVE

## 2019-10-24 LAB — CERVICOVAGINAL ANCILLARY ONLY
Bacterial Vaginitis (gardnerella): POSITIVE — AB
Candida Glabrata: NEGATIVE
Candida Vaginitis: NEGATIVE
Chlamydia: NEGATIVE
Comment: NEGATIVE
Comment: NEGATIVE
Comment: NEGATIVE
Comment: NEGATIVE
Comment: NEGATIVE
Comment: NORMAL
Neisseria Gonorrhea: NEGATIVE
Trichomonas: NEGATIVE

## 2019-10-24 LAB — RPR: RPR Ser Ql: NONREACTIVE

## 2019-10-24 LAB — POCT PREGNANCY, URINE: Preg Test, Ur: NEGATIVE

## 2019-10-24 MED ORDER — DULERA 200-5 MCG/ACT IN AERO: 2.0000 | INHALATION_SPRAY | Freq: Two times a day (BID) | RESPIRATORY_TRACT | 3 refills | Status: DC

## 2019-10-24 MED ORDER — METRONIDAZOLE 500 MG PO TABS: 500.0000 mg | ORAL_TABLET | Freq: Two times a day (BID) | ORAL | 0 refills | Status: DC

## 2019-10-24 NOTE — Addendum Note (Signed)
Addended by: Vivien Rota on: 10/24/2019 03:53 PM   Modules accepted: Orders

## 2019-10-24 NOTE — Progress Notes (Signed)
Virtual Visit via Telephone Note  I connected with Elizabeth Villarreal on 10/24/19 at 1:46 p.m by telephone and verified that I am speaking with the correct person using two identifiers.   I discussed the limitations, risks, security and privacy concerns of performing an evaluation and management service by telephone and the availability of in person appointments. I also discussed with the patient that there may be a patient responsible charge related to this service. The patient expressed understanding and agreed to proceed.   History of Present Illness: Pt with hx of COPD,formertob dep, neuropathic pain of both feet,  iron defand depression.   Since last visit, she saw Dr. Delford Field in hosp f/u for RT sided CAP.  Pneumonia involve the right upper lobe so acid-fast studies were done which came back negative.  A follow-up CT chest revealed resolving changes of pneumonia.  Patient states she was called with the results by Dr. Delford Field. Reports her breathing is at baseline at this time. Dose Dulera increased on last visit but pt states she was given the previous lower dose by the pharmacy.  GYN:  Saw Dr. Earlene Plater yesterday.  She did not see a vaginal lesion but patient had endometrial biopsy due to abnormal uterine bleeding. Results are pending.  Still staying at [facility].  Working with case worker, Chief Technology Officer, at shelter but she has not been able to catch up with her.  She prefers not to be in congregate setting through the winter especially with the Covid pandemic.   Current Outpatient Medications on File Prior to Visit  Medication Sig Dispense Refill  . acetaminophen (TYLENOL) 325 MG tablet Take 650 mg by mouth every 6 (six) hours as needed for mild pain.    Marland Kitchen albuterol (PROAIR HFA) 108 (90 Base) MCG/ACT inhaler Inhale 1-2 puffs into the lungs every 6 (six) hours as needed for wheezing or shortness of breath. 18 g 6  . albuterol (PROVENTIL) (2.5 MG/3ML) 0.083% nebulizer solution USE ONE VIAL VIA  NEBULIZER EVERY SIX HOURS AS NEEDED FOR WHEEZING OR SHORTNESS OF BREATH WITH THE IPRATROPIUM (Patient taking differently: 2.5 mg every 6 (six) hours as needed for wheezing or shortness of breath. WITH THE IPRATROPIUM) 375 mL 2  . folic acid (FOLVITE) 1 MG tablet Take 1 tablet (1 mg total) by mouth daily. 90 tablet 1  . gabapentin (NEURONTIN) 300 MG capsule Take 1 capsule (300 mg total) by mouth at bedtime. 30 capsule 3  . ipratropium (ATROVENT HFA) 17 MCG/ACT inhaler Inhale 2 puffs into the lungs every 6 (six) hours. 1 Inhaler 12  . ipratropium (ATROVENT) 0.02 % nebulizer solution Take 2.5 mLs (0.5 mg total) by nebulization 4 (four) times daily. 300 mL 2  . mometasone-formoterol (DULERA) 200-5 MCG/ACT AERO Inhale 2 puffs into the lungs 2 (two) times daily. 13 g 3  . vitamin B-12 (CYANOCOBALAMIN) 500 MCG tablet Take 1 tablet (500 mcg total) by mouth daily. 90 tablet 1   No current facility-administered medications on file prior to visit.       Observations/Objective:  Lab Results  Component Value Date   WBC 6.8 10/07/2019   HGB 14.2 10/07/2019   HCT 43.3 10/07/2019   MCV 93 10/07/2019   PLT 386 10/07/2019     Chemistry      Component Value Date/Time   NA 141 10/07/2019 0949   K 4.6 10/07/2019 0949   CL 103 10/07/2019 0949   CO2 25 10/07/2019 0949   BUN 10 10/07/2019 0949   CREATININE 0.63 10/07/2019  [id number]      Component Value Date/Time   CALCIUM 9.7 10/07/2019 0949   ALKPHOS 52 10/07/2019 0949   AST 22 10/07/2019 0949   ALT 14 10/07/2019 0949   BILITOT <0.2 10/07/2019 0949      Assessment and Plan: 1. COPD GOLD D  with chronic bronchitis ([place]) I have sent a message to the pharmacy to let them know that the dose of Dulera was increased by Dr. Joya Gaskins on prescription written last month so that she gets the correct dose on her next refill.  2. Homeless Patient tells me that our caseworker was supposed to try to touch base with the caseworker at the homeless shelter on her  behalf but she has not heard anything.  I will send a message to our caseworker  3. Abnormal uterine bleeding Work-up being done by gynecology.   Follow Up Instructions: 3 to 4 months   I discussed the assessment and treatment plan with the patient. The patient was provided an opportunity to ask questions and all were answered. The patient agreed with the plan and demonstrated an understanding of the instructions.   The patient was advised to call back or seek an in-person evaluation if the symptoms worsen or if the condition fails to improve as anticipated.  I provided 11 minutes of non-face-to-face time during this encounter.   Karle Plumber, MD

## 2019-10-25 LAB — CYTOLOGY - PAP
Comment: NEGATIVE
Diagnosis: NEGATIVE
High risk HPV: NEGATIVE

## 2019-10-25 LAB — SURGICAL PATHOLOGY

## 2019-10-25 MED FILL — metroNIDAZOLE 500 MG TABS: 500 | 7 days supply | Qty: 14 | Fill #0

## 2019-10-28 ENCOUNTER — Telehealth: Payer: Self-pay

## 2019-10-28 NOTE — Telephone Encounter (Signed)
Contacted pt to schedule 4 month f/u pt didn't answer lvm asking pt to give a call back to schedule  

## 2019-10-29 ENCOUNTER — Telehealth: Payer: Self-pay

## 2019-10-29 NOTE — Telephone Encounter (Signed)
Call placed to patient regarding housing questions. She said that she received information about housing from [employer] about [email or web address]  She made many calls only to find that there are long wait lists.  This CM reminded her that Glenard Haring will provide her with information but does not find the housing  This CM also provided her with the contact # for [employer] and the [email or web address] website.

## 2019-11-04 ENCOUNTER — Other Ambulatory Visit: Payer: Self-pay

## 2019-11-04 ENCOUNTER — Ambulatory Visit (HOSPITAL_COMMUNITY)
Admission: RE | Admit: 2019-11-04 | Discharge: 2019-11-04 | Disposition: A | Discharge status: Home / Self Care | Payer: Medicaid Other | Source: Ambulatory Visit | Attending: Obstetrics and Gynecology | Admitting: Obstetrics and Gynecology

## 2019-11-04 DIAGNOSIS — N939 Abnormal uterine and vaginal bleeding, unspecified: Secondary | ICD-10-CM | POA: Diagnosis present

## 2019-11-05 ENCOUNTER — Encounter: Payer: Self-pay | Admitting: *Deleted

## 2019-11-18 ENCOUNTER — Other Ambulatory Visit: Payer: Self-pay | Admitting: Pharmacist

## 2019-11-18 MED ORDER — DULERA 200-5 MCG/ACT IN AERO: 2.0000 | INHALATION_SPRAY | Freq: Two times a day (BID) | RESPIRATORY_TRACT | 2 refills | Status: DC

## 2019-11-18 MED FILL — !DULERA 200 MCG/5 MCG INH: 200-5 | 30 days supply | Qty: 13 | Fill #0

## 2019-11-18 MED FILL — GABAPENTIN 300 MG CAPSULE: 300 | 30 days supply | Qty: 30 | Fill #1

## 2019-11-18 MED FILL — ALBUTEROL SULFATE HFA 108 (: 108 (90 BAS | 25 days supply | Qty: 18 | Fill #2

## 2019-11-18 MED FILL — ATROVENT HFA INHALER: 17 | 25 days supply | Qty: 13 | Fill #2

## 2019-11-19 NOTE — Progress Notes (Signed)
Subjective:    Patient ID: Elizabeth Villarreal, female    DOB: [birth year], 48 y.o.   MRN: [id number]  This patient was a no-show This is a 48 year old female actually seen previously at community health and wellness clinic in September 2019.  Patient has a history of COPD but actually more recently was admitted for 9 days at [facility] for right upper lobe pneumonia and sepsis.  The patient actually left the [place] area and went to live in [place] with a relative but when they lost housing there she returned to the area during the midst of the Covid crisis and was in a quarantine at the remodel and and then from there came to the homeless shelter where she has now been for several weeks.  The patient's issue today is that she is run out of her medications and needs follow-up.  She also needs follow-up on the fact that she has had pneumonia.  Below is a copy of the discharge summary She was admitted September 22nd through October 1 History of present illness: -48 y.o.femalewithhistory of COPD previous tobacco abuse recently placed on gabapentin for neuropathy which patient has not taken presents to the ER with complaint of fever chills. Patient states her symptoms started this morning and has not had any productive cough until patient reached ER. Has not had any recent travel but has been living in the shelter for last 1 month  Hospital Course:  1. Sepsis/community-acquired pneumonia -Secondary to community-acquired pneumonia, clinically improved significantly with IV ceftriaxone and azithromycin, sputum cultures were negative -COVID-19 PCR is negative -Given upper lobe infiltrates and as she lives in a shelter,a QuantiFERON-TB gold was sent out on admission, this was noted to be in the indeterminant, case was discussed by my partner with infectious disease, subsequently had AFB smears sent which were negative -Hence no further work-up was not felt to be indicated  2.COPD  exacerbation, pneumonia  -Clinically improved as above, transition to prednisone taper and oral antibiotics to complete course -Recommend follow-up chest x-ray in 4 to 6 weeks  3.Peripheral neuropathy, continue gabapentin  4.Tobacco abuse -Quit smoking 2 years ago, counseled   Currently her shortness of breath is improved.  She is not able to use the nebulizer and homeless shelter because of aerosolization is not asking for an HFA substitute.  She is off prednisone.  She states prednisone has been helpful to her.   On exam blood pressure is 118/72 temps 97 saturation 96% room air pulse 80 chest show distant breath sounds without wheeze rale or rhonchi cardiac exam showed a regular rate rhythm without S3-S4 normal S1-S2 abdomen soft nontender extremities show no edema  Impression is that of COPD asthmatic bronchitic component  I will refill the patient's prednisone at a dose of 5 mg daily we will also refill her Dulera 2 but increase the strength to 200 mcg at 2 puffs twice daily as well she will receive an Atrovent inhaler at 2 puffs 4 times daily in place of her ipratropium minutes in the nebulizer   We will also establish her back into the health and wellness clinic and obtain a chest x-ray as well to follow-up the right upper lobe pneumonia to ensure it has cleared   10/07/2019 The patient returns to reestablish in the clinic after having moved away to [place] for a 71-month interval having returned to this area 2 months ago.  The patient currently is homeless and staying at the [place] homeless shelter.  She  recently was admitted as noted above for COPD exacerbation.  We then saw her back in the homeless shelter clinic and restarted the [facility] and Atrovent inhalers and the patient finished her course of prednisone and now is maintaining 5 mg daily of prednisone as a maintenance.  The patient states her breathing is improved.  She is not smoked in 2 years.  She had a  repeat chest x-ray which showed improvement in the right upper lobe pneumonia.  There is still some small cavitary change in the right upper lobe and left lower lobe nodule seen.  The patient needs a repeat CT scan of the chest for the same.  Currently there is no cough there is some chest tightness and soreness behind the shoulder blades.  She is still working on housing.  Note during the hospitalization the patient did receive a flu vaccine  11/19/2019 At last OV CAP (community acquired pneumonia) Right lung community-acquired pneumonia now resolved on x-ray except for residual small cavitary change right upper lobe  No additional antibiotics indicated  COPD GOLD D  with chronic bronchitis (HCC) Gold stage D COPD with pulmonary functions in March 2019 showing FEV1 28% predicted diffusion capacity 30% predicted  We will continue the [facility] and Atrovent inhalers as prescribed with as needed albuterol  Lung nodule seen on imaging study Left lower lobe lung nodule seen will need follow-up CT scanning   Diagnoses and all orders for this visit:  COPD GOLD D  with chronic bronchitis (HCC) -     CBC with Differential/Platelet; Future -     Comprehensive metabolic panel -     CBC with Differential/Platelet  Neuropathic pain of both feet -     Discontinue: gabapentin (NEURONTIN) 300 MG capsule; Take 1 capsule (300 mg total) by mouth at bedtime. -     gabapentin (NEURONTIN) 300 MG capsule; Take 1 capsule (300 mg total) by mouth at bedtime.  Abnormal findings on diagnostic imaging of lung -     CT Chest Wo Contrast; Future  Community acquired pneumonia of right upper lobe of lung -     CBC with Differential/Platelet; Future -     CBC with Differential/Platelet  Lung nodule seen on imaging study  Other orders -     folic acid (FOLVITE) 1 MG tablet; Take 1 tablet (1 mg total) by mouth daily.     Past Medical History:  Diagnosis Date  . CAP (community acquired  pneumonia) 08/27/2019  . COPD (chronic obstructive pulmonary disease) (HCC)   . Depression   . Tobacco abuse      Family History  Problem Relation Age of Onset  . COPD Mother   . Lung cancer Father      Social History   Socioeconomic History  . Marital status: Single    Spouse name: Not on file  . Number of children: Not on file  . Years of education: Not on file  . Highest education level: Not on file  Occupational History  . Not on file  Tobacco Use  . Smoking status: Former Smoker    Packs/day: 1.00    Types: Cigarettes    Quit date: 01/15/2018    Years since quitting: 1.8  . Smokeless tobacco: Never Used  Substance and Sexual Activity  . Alcohol use: No  . Drug use: No  . Sexual activity: Not Currently    Birth control/protection: None  Other Topics Concern  . Not on file  Social History Narrative  . Not  on file   Social Determinants of Health   Financial Resource Strain:   . Difficulty of Paying Living Expenses: Not on file  Food Insecurity:   . Worried About Programme researcher, broadcasting/film/video in the Last Year: Not on file  . Ran Out of Food in the Last Year: Not on file  Transportation Needs:   . Lack of Transportation (Medical): Not on file  . Lack of Transportation (Non-Medical): Not on file  Physical Activity:   . Days of Exercise per Week: Not on file  . Minutes of Exercise per Session: Not on file  Stress:   . Feeling of Stress : Not on file  Social Connections:   . Frequency of Communication with Friends and Family: Not on file  . Frequency of Social Gatherings with Friends and Family: Not on file  . Attends Religious Services: Not on file  . Active Member of Clubs or Organizations: Not on file  . Attends Banker Meetings: Not on file  . Marital Status: Not on file  Intimate Partner Violence:   . Fear of Current or Ex-Partner: Not on file  . Emotionally Abused: Not on file  . Physically Abused: Not on file  . Sexually Abused: Not on file      Allergies  Allergen Reactions  . Darvon [Propoxyphene] Other (See Comments)    Patient was aware her spoken words were not matching her thoughts     Outpatient Medications Prior to Visit  Medication Sig Dispense Refill  . acetaminophen (TYLENOL) 325 MG tablet Take 650 mg by mouth every 6 (six) hours as needed for mild pain.    Marland Kitchen albuterol (PROAIR HFA) 108 (90 Base) MCG/ACT inhaler Inhale 1-2 puffs into the lungs every 6 (six) hours as needed for wheezing or shortness of breath. 18 g 6  . albuterol (PROVENTIL) (2.5 MG/3ML) 0.083% nebulizer solution USE ONE VIAL VIA NEBULIZER EVERY SIX HOURS AS NEEDED FOR WHEEZING OR SHORTNESS OF BREATH WITH THE IPRATROPIUM (Patient taking differently: 2.5 mg every 6 (six) hours as needed for wheezing or shortness of breath. WITH THE IPRATROPIUM) 375 mL 2  . folic acid (FOLVITE) 1 MG tablet Take 1 tablet (1 mg total) by mouth daily. 90 tablet 1  . gabapentin (NEURONTIN) 300 MG capsule Take 1 capsule (300 mg total) by mouth at bedtime. 30 capsule 3  . ipratropium (ATROVENT HFA) 17 MCG/ACT inhaler Inhale 2 puffs into the lungs every 6 (six) hours. 1 Inhaler 12  . ipratropium (ATROVENT) 0.02 % nebulizer solution Take 2.5 mLs (0.5 mg total) by nebulization 4 (four) times daily. 300 mL 2  . metroNIDAZOLE (FLAGYL) 500 MG tablet Take 1 tablet (500 mg total) by mouth 2 (two) times daily. 14 tablet 0  . mometasone-formoterol (DULERA) 200-5 MCG/ACT AERO Inhale 2 puffs into the lungs 2 (two) times daily. 13 g 2  . vitamin B-12 (CYANOCOBALAMIN) 500 MCG tablet Take 1 tablet (500 mcg total) by mouth daily. 90 tablet 1   No facility-administered medications prior to visit.    Review of Systems Constitutional:   No  weight loss, night sweats,  Fevers, chills, fatigue, lassitude. HEENT:   No headaches,  Difficulty swallowing,  Tooth/dental problems,  Sore throat,                No sneezing, itching, ear ache, nasal congestion, post nasal drip,   CV:  No chest pain,   Orthopnea, PND, swelling in lower extremities, anasarca, dizziness, palpitations  GI  No heartburn,  indigestion, abdominal pain, nausea, vomiting, diarrhea, change in bowel habits, loss of appetite  Resp:  shortness of breath with exertion or at rest.  No excess mucus, no productive cough,  No non-productive cough,  No coughing up of blood.  No change in color of mucus.  No wheezing.  No chest wall deformity  Skin: no rash or lesions.  GU: no dysuria, change in color of urine, no urgency or frequency.  No flank pain.  MS:  No joint pain or swelling.  No decreased range of motion.  No back pain.  Psych:  No change in mood or affect. No depression or anxiety.  No memory loss.     Objective:   Physical Exam There were no vitals filed for this visit.  Gen: Pleasant, well-nourished, in no distress,  normal affect  ENT: No lesions,  mouth clear,  oropharynx clear, no postnasal drip  Neck: No JVD, no TMG, no carotid bruits  Lungs: No use of accessory muscles, no dullness to percussion, hyperresonance to percussion and decreased breath sounds  Cardiovascular: RRR, heart sounds normal, no murmur or gallops, no peripheral edema  Abdomen: soft and NT, no HSM,  BS normal  Musculoskeletal: No deformities, no cyanosis or clubbing  Neuro: alert, non focal  Skin: Warm, no lesions or rashes   CXR 10/30: IMPRESSION: 1. Near complete resolution of right upper lobe pneumonia. Small somewhat irregular opacity remains in the right upper lobe, potentially with central cavitation. One additional short interval radiographic follow-up could be performed to assess for resolution, alternatively chest CT could be obtained given underlying emphysema. 2. Small focal opacity at the lingula, atelectasis versus minimal infiltrate 3. Hyperinflated lungs with emphysema       Assessment & Plan:  I personally reviewed [facility] as well as any outside material available  during this office visit and agree with the  radiology impressions.   No problem-specific Assessment & Plan notes found for this encounter.   There are no diagnoses linked to this encounter.

## 2019-11-20 ENCOUNTER — Ambulatory Visit: Payer: Medicaid Other | Attending: Critical Care Medicine | Admitting: Critical Care Medicine

## 2019-11-20 ENCOUNTER — Encounter: Payer: Self-pay | Admitting: Critical Care Medicine

## 2019-11-20 ENCOUNTER — Other Ambulatory Visit: Payer: Self-pay

## 2019-11-20 DIAGNOSIS — J449 Chronic obstructive pulmonary disease, unspecified: Secondary | ICD-10-CM

## 2019-11-21 ENCOUNTER — Ambulatory Visit: Payer: Medicaid Other | Admitting: Obstetrics and Gynecology

## 2019-11-25 ENCOUNTER — Other Ambulatory Visit: Payer: Self-pay

## 2019-11-25 DIAGNOSIS — Z20822 Contact with and (suspected) exposure to covid-19: Secondary | ICD-10-CM

## 2019-11-27 ENCOUNTER — Other Ambulatory Visit: Payer: Self-pay | Admitting: Critical Care Medicine

## 2019-11-27 ENCOUNTER — Encounter: Payer: Self-pay | Admitting: Critical Care Medicine

## 2019-11-27 DIAGNOSIS — R0989 Other specified symptoms and signs involving the circulatory and respiratory systems: Secondary | ICD-10-CM

## 2019-11-27 DIAGNOSIS — M79671 Pain in right foot: Secondary | ICD-10-CM

## 2019-11-27 DIAGNOSIS — M79672 Pain in left foot: Secondary | ICD-10-CM

## 2019-11-27 LAB — NOVEL CORONAVIRUS, NAA: SARS-CoV-2, NAA: NOT DETECTED

## 2019-11-27 NOTE — Progress Notes (Unsigned)
Orders for LE

## 2019-11-28 NOTE — Progress Notes (Signed)
Patient ID: Elizabeth Villarreal, female   DOB: 03/03/1971, 49 y.o.   MRN: [id number] I connected with this patient [facility] and she complains today of some chest aching stinging sensation but no cough no mucus.  She has no headaches.  No visual changes.  She just had a Covid test this week was negative.  She has no prior history of hemoglobin A1c elevations or diabetes.  She complains of bilateral foot pain and burning sensations in the feet.  On exam pulse is 78 blood pressure 118/76 saturation 95% room air She is in no acute distress  Chest show distant breath sounds without wheeze or rhonchi  Cardiac exam was unremarkable  The feet were examined there were no evidence of ulceration or there was significant reduction in both the dorsalis pedis and posterior tibialis pulses in the feet were extremely cold as were the lower extremities  Impression is that a potential for peripheral artery disease  Plan will be to obtain vascular Doppler ABIs of both lower extremities and this order was placed and we will follow-up with this patient on this

## 2019-12-02 ENCOUNTER — Telehealth: Payer: Self-pay | Admitting: Internal Medicine

## 2019-12-02 NOTE — Telephone Encounter (Signed)
Kylie form the pre service center called requesting a prior auth for the patient that has an ultrasound on 12/04/19. Please f/u

## 2019-12-03 ENCOUNTER — Other Ambulatory Visit: Payer: Self-pay

## 2019-12-03 NOTE — Telephone Encounter (Signed)
Called patient and LVM to return call and schedule an in person appt with Dr. Joya Gaskins.

## 2019-12-03 NOTE — Telephone Encounter (Signed)
Went on the [employer] and did prior auth for Vas [place] ABI with/wo TBI   [phone] Complete bilateral noninvasive physiologic studies of upper or lower extremity arteries ICD-M79.671 pain in right foot         M79.672 pain in left foot         R09.89 Other specified symptoms and signs involving the circulatory and respiratory systems    Case #[id number]   Had to provided additional clinical information. Provided information and it had to be submitted for reviewed. Contacted [employer] and spoke with Megan Salon .intake representative    Asked to speak with the clinical reviewer and spoke with [facility].   Went over Clinical infromation and Charleston Poot was able to approve the Vas [place]   Authorization #[id number] Effective- 12/03/19 Expires- 05/31/2020

## 2019-12-04 ENCOUNTER — Ambulatory Visit (HOSPITAL_COMMUNITY)
Admission: RE | Admit: 2019-12-04 | Discharge: 2019-12-04 | Disposition: A | Discharge status: Home / Self Care | Payer: Medicaid Other | Source: Ambulatory Visit | Attending: Critical Care Medicine | Admitting: Critical Care Medicine

## 2019-12-04 ENCOUNTER — Other Ambulatory Visit: Payer: Self-pay

## 2019-12-04 ENCOUNTER — Ambulatory Visit (HOSPITAL_COMMUNITY): Payer: Medicaid Other

## 2019-12-04 DIAGNOSIS — M79671 Pain in right foot: Secondary | ICD-10-CM | POA: Insufficient documentation

## 2019-12-04 DIAGNOSIS — R0989 Other specified symptoms and signs involving the circulatory and respiratory systems: Secondary | ICD-10-CM | POA: Diagnosis present

## 2019-12-04 DIAGNOSIS — M79672 Pain in left foot: Secondary | ICD-10-CM | POA: Diagnosis present

## 2019-12-16 ENCOUNTER — Ambulatory Visit: Payer: Medicaid Other | Admitting: Obstetrics and Gynecology

## 2019-12-18 MED FILL — ATROVENT HFA INHALER: 17 | 25 days supply | Qty: 13 | Fill #3

## 2019-12-18 MED FILL — DULERA 200 MCG/5 MCG INH: 200-5 | 30 days supply | Qty: 13 | Fill #1

## 2019-12-18 MED FILL — ALBUTEROL SULFATE HFA 108 (: 108 (90 BAS | 25 days supply | Qty: 18 | Fill #3

## 2019-12-18 MED FILL — GABAPENTIN 300 MG CAPSULE: 300 | 30 days supply | Qty: 30 | Fill #2

## 2019-12-24 ENCOUNTER — Other Ambulatory Visit: Payer: Self-pay | Admitting: Internal Medicine

## 2019-12-24 DIAGNOSIS — Z1231 Encounter for screening mammogram for malignant neoplasm of breast: Secondary | ICD-10-CM

## 2019-12-25 ENCOUNTER — Encounter: Payer: Self-pay | Admitting: Critical Care Medicine

## 2019-12-25 NOTE — Congregational Nurse Program (Signed)
Elizabeth Villarreal was seen by Dr. Delford Field at clinic this PM for medication adjustment. Dr.Wright will make appointment at [facility]  Rx at [facility] pharmacy, and pt. states she will pick it up.

## 2019-12-26 DIAGNOSIS — Z1231 Encounter for screening mammogram for malignant neoplasm of breast: Secondary | ICD-10-CM

## 2019-12-26 NOTE — Progress Notes (Signed)
Patient ID: Elizabeth Villarreal, female   DOB: 05/29/1971, 49 y.o.   MRN: [id number] This is a 49 year old female with gold stage D COPD and she is seen today in the [facility] clinic.  She has run out of her gabapentin and needs this refill for her nerve pain.  The patient also needs a clinic appointment for follow-up for her COPD  Note she recently had a vascular arterial ABI of her lower extremities which were normal no evidence of peripheral vascular disease  Plan will be to refill the gabapentin and obtain a follow-up visit and the health and wellness center

## 2019-12-27 ENCOUNTER — Ambulatory Visit: Payer: Medicaid Other | Admitting: Obstetrics & Gynecology

## 2020-01-07 ENCOUNTER — Telehealth: Payer: Self-pay | Admitting: Internal Medicine

## 2020-01-07 DIAGNOSIS — G5793 Unspecified mononeuropathy of bilateral lower limbs: Secondary | ICD-10-CM

## 2020-01-07 MED ORDER — GABAPENTIN 300 MG PO CAPS: 300.0000 mg | ORAL_CAPSULE | Freq: Two times a day (BID) | ORAL | 3 refills | Status: DC

## 2020-01-07 NOTE — Telephone Encounter (Signed)
Pt called stating she saw Dr. Delford Field on 12/25/2019 at the [facility] clinic, she states provider changed the following medication to twice daily -gabapentin (NEURONTIN) 300 MG capsule  Pt states pharmacy did not receive the new change. Please follow up

## 2020-01-07 NOTE — Telephone Encounter (Signed)
Let the patient know I made this change and sent to our pharmacy

## 2020-01-08 MED FILL — GABAPENTIN 300 MG CAPSULE: 300 | 30 days supply | Qty: 60 | Fill #0

## 2020-01-15 ENCOUNTER — Ambulatory Visit: Payer: Medicaid Other | Admitting: Critical Care Medicine

## 2020-01-16 ENCOUNTER — Encounter: Payer: Self-pay | Admitting: Critical Care Medicine

## 2020-01-16 NOTE — Congregational Nurse Program (Signed)
Elizabeth Villarreal was seen at [facility] walk-in clinic for c/o irregular menses to r/o menopause. Advised her to reschedule GYN appointment.

## 2020-01-17 ENCOUNTER — Other Ambulatory Visit: Payer: Self-pay

## 2020-01-17 DIAGNOSIS — Z20822 Contact with and (suspected) exposure to covid-19: Secondary | ICD-10-CM

## 2020-01-17 NOTE — Progress Notes (Signed)
Patient ID: Elizabeth Villarreal, female   DOB: [birth year], 49 y.o.   MRN: [id number]  This patient came to the [facility] clinic to indicate a need to have her gynecology appointment rescheduled she is having issues with menstrual period variations and had a history of pelvic cyst in the left ovary  I communicated with our case manager and we got this patient an appointment for February 19 and she will attempt to keep this appointment

## 2020-01-19 LAB — NOVEL CORONAVIRUS, NAA: SARS-CoV-2, NAA: NOT DETECTED

## 2020-01-21 MED FILL — predniSONE 10 MG TABS: 10 | 10 days supply | Qty: 5 | Fill #1

## 2020-01-21 MED FILL — DULERA 200 MCG/5 MCG INH: 200-5 | 30 days supply | Qty: 13 | Fill #2

## 2020-01-21 MED FILL — ALBUTEROL SULFATE HFA 108 (: 108 (90 BAS | 25 days supply | Qty: 18 | Fill #4

## 2020-01-21 MED FILL — ATROVENT HFA INHALER: 17 | 25 days supply | Qty: 13 | Fill #4

## 2020-01-24 ENCOUNTER — Ambulatory Visit: Payer: Medicaid Other | Admitting: Obstetrics & Gynecology

## 2020-01-24 ENCOUNTER — Telehealth: Payer: Self-pay | Admitting: Family Medicine

## 2020-01-24 NOTE — Telephone Encounter (Signed)
Attempted to contact patient with her rescheduled appointment from 2/19 ( new appointment 3/3 @ 2:55). No answer, left voicemail with the appointment information. Patient instructed to give the office a call back if the date or time does not work for her.

## 2020-01-31 MED FILL — GABAPENTIN 300 MG CAPSULE: 300 | 30 days supply | Qty: 60 | Fill #1

## 2020-02-05 ENCOUNTER — Encounter: Payer: Self-pay | Admitting: Obstetrics and Gynecology

## 2020-02-05 ENCOUNTER — Other Ambulatory Visit: Payer: Self-pay

## 2020-02-05 ENCOUNTER — Ambulatory Visit (INDEPENDENT_AMBULATORY_CARE_PROVIDER_SITE_OTHER): Payer: Medicaid Other | Admitting: Obstetrics and Gynecology

## 2020-02-05 VITALS — BP 116/77 | HR 80 | Wt 138.1 lb

## 2020-02-05 DIAGNOSIS — N83201 Unspecified ovarian cyst, right side: Secondary | ICD-10-CM | POA: Diagnosis not present

## 2020-02-05 DIAGNOSIS — J449 Chronic obstructive pulmonary disease, unspecified: Secondary | ICD-10-CM | POA: Diagnosis not present

## 2020-02-05 DIAGNOSIS — F320 Major depressive disorder, single episode, mild: Secondary | ICD-10-CM

## 2020-02-05 DIAGNOSIS — N920 Excessive and frequent menstruation with regular cycle: Secondary | ICD-10-CM | POA: Diagnosis present

## 2020-02-05 DIAGNOSIS — N83209 Unspecified ovarian cyst, unspecified side: Secondary | ICD-10-CM | POA: Insufficient documentation

## 2020-02-05 DIAGNOSIS — J4489 Other specified chronic obstructive pulmonary disease: Secondary | ICD-10-CM

## 2020-02-05 MED ORDER — PROGESTERONE MICRONIZED 200 MG PO CAPS: 200.0000 mg | ORAL_CAPSULE | ORAL | 2 refills | Status: DC

## 2020-02-05 MED FILL — PROGESTERONE 200 MG CAPSULE: 200 | 90 days supply | Qty: 30 | Fill #0

## 2020-02-12 ENCOUNTER — Other Ambulatory Visit: Payer: Self-pay | Admitting: Obstetrics and Gynecology

## 2020-02-12 ENCOUNTER — Ambulatory Visit (HOSPITAL_COMMUNITY)
Admission: RE | Admit: 2020-02-12 | Discharge: 2020-02-12 | Disposition: A | Discharge status: Home / Self Care | Payer: Medicaid Other | Source: Ambulatory Visit | Attending: Obstetrics and Gynecology | Admitting: Obstetrics and Gynecology

## 2020-02-12 ENCOUNTER — Other Ambulatory Visit: Payer: Self-pay

## 2020-02-12 DIAGNOSIS — N83201 Unspecified ovarian cyst, right side: Secondary | ICD-10-CM

## 2020-02-13 ENCOUNTER — Encounter: Payer: Self-pay | Admitting: Critical Care Medicine

## 2020-02-13 ENCOUNTER — Other Ambulatory Visit: Payer: Self-pay | Admitting: Critical Care Medicine

## 2020-02-13 MED ORDER — BENZOYL PEROXIDE-ERYTHROMYCIN 5-3 % EX GEL: Freq: Two times a day (BID) | CUTANEOUS | 0 refills | Status: DC

## 2020-02-13 NOTE — Progress Notes (Signed)
meds for rash on face

## 2020-02-13 NOTE — Congregational Nurse Program (Signed)
Elizabeth Villarreal seen at [facility] clinic for rash to face around nasal area. Elizabeth Villarreal consulted Dr.Wright about entering in a blind Covid Study. Client will pick-up her medication at [facility]

## 2020-02-14 ENCOUNTER — Other Ambulatory Visit: Payer: Self-pay | Admitting: Internal Medicine

## 2020-02-14 IMAGING — DX DG CHEST 1V PORT
1 series · 2 of 2 positions shown · non-contrast
Comparison: Radiographs and CT 01/28/2018

CLINICAL DATA: Shortness of breath.

EXAM:
PORTABLE CHEST 1 VIEW

[Series 1: chest · 0.14mm/px · 2 of 2 slices shown]
[im 1/2]
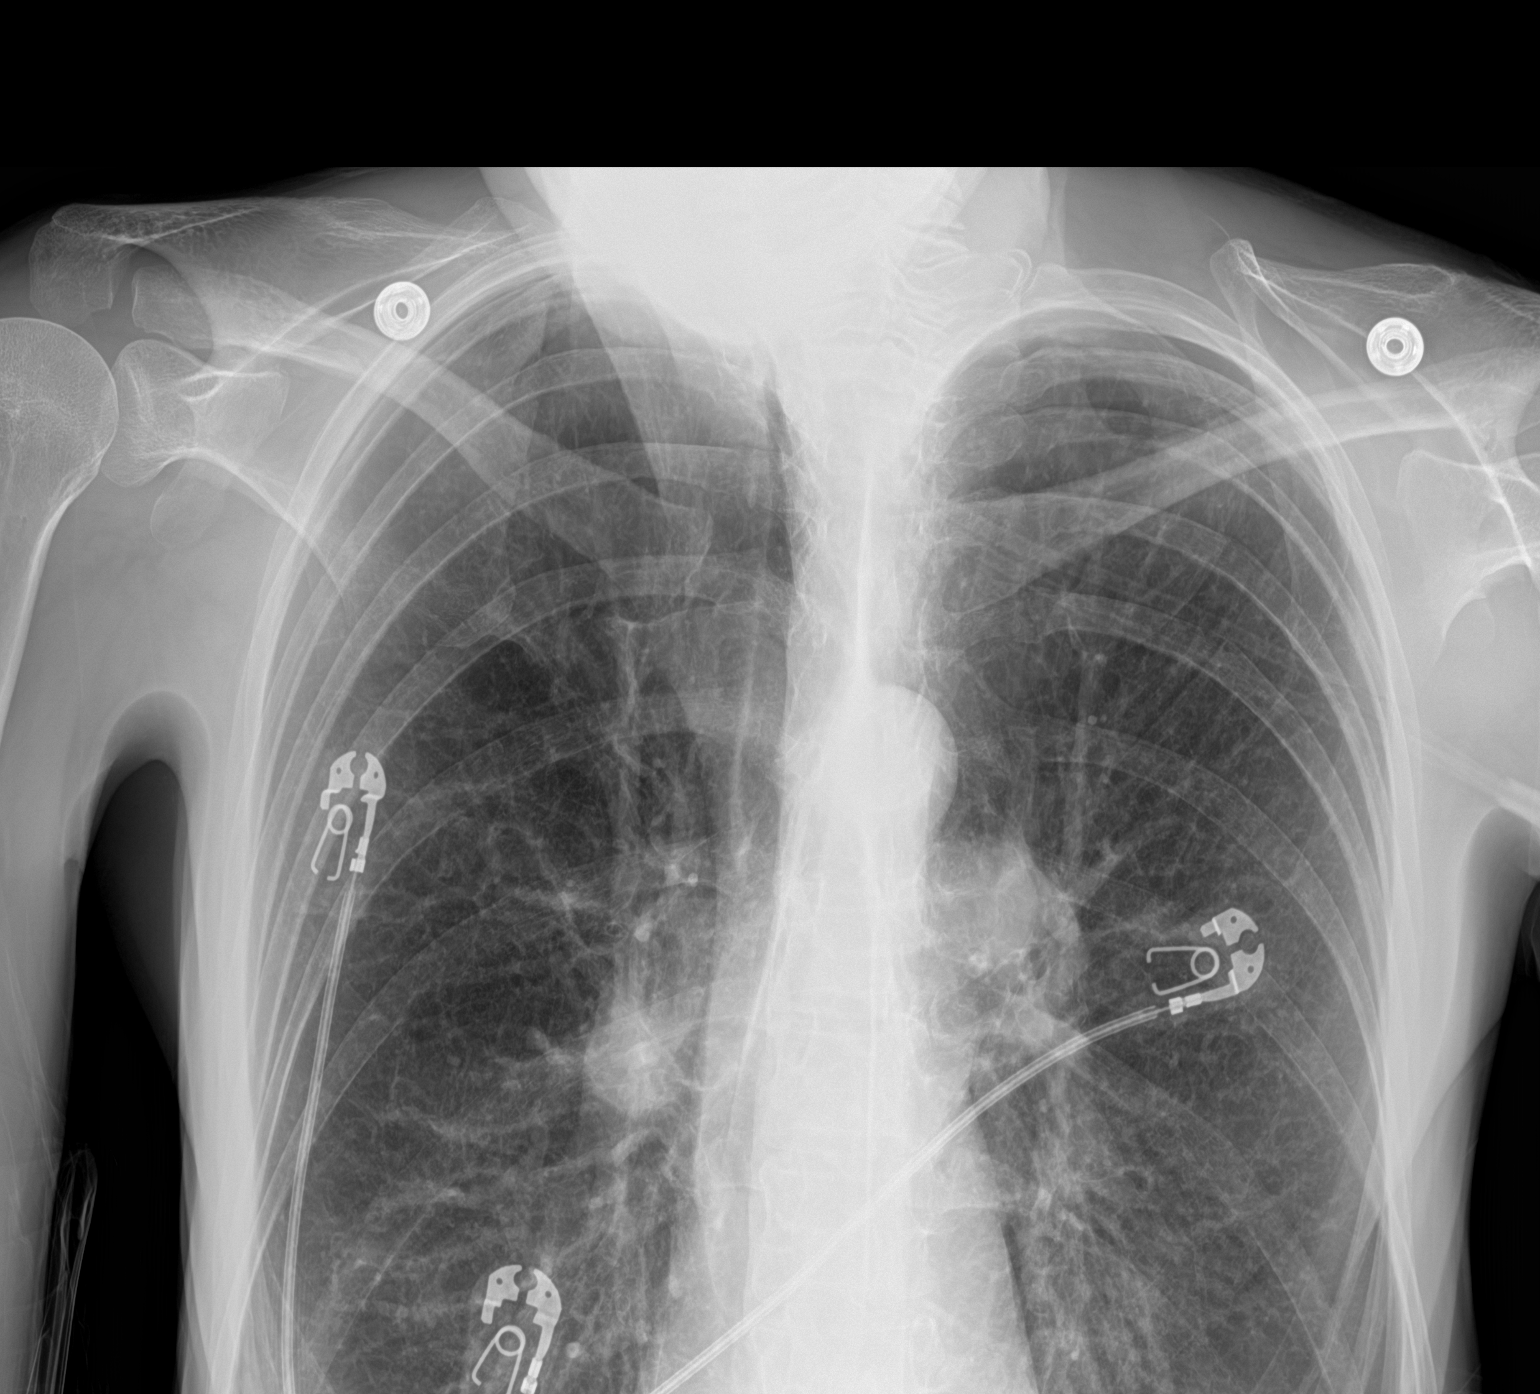
[im 2/2]
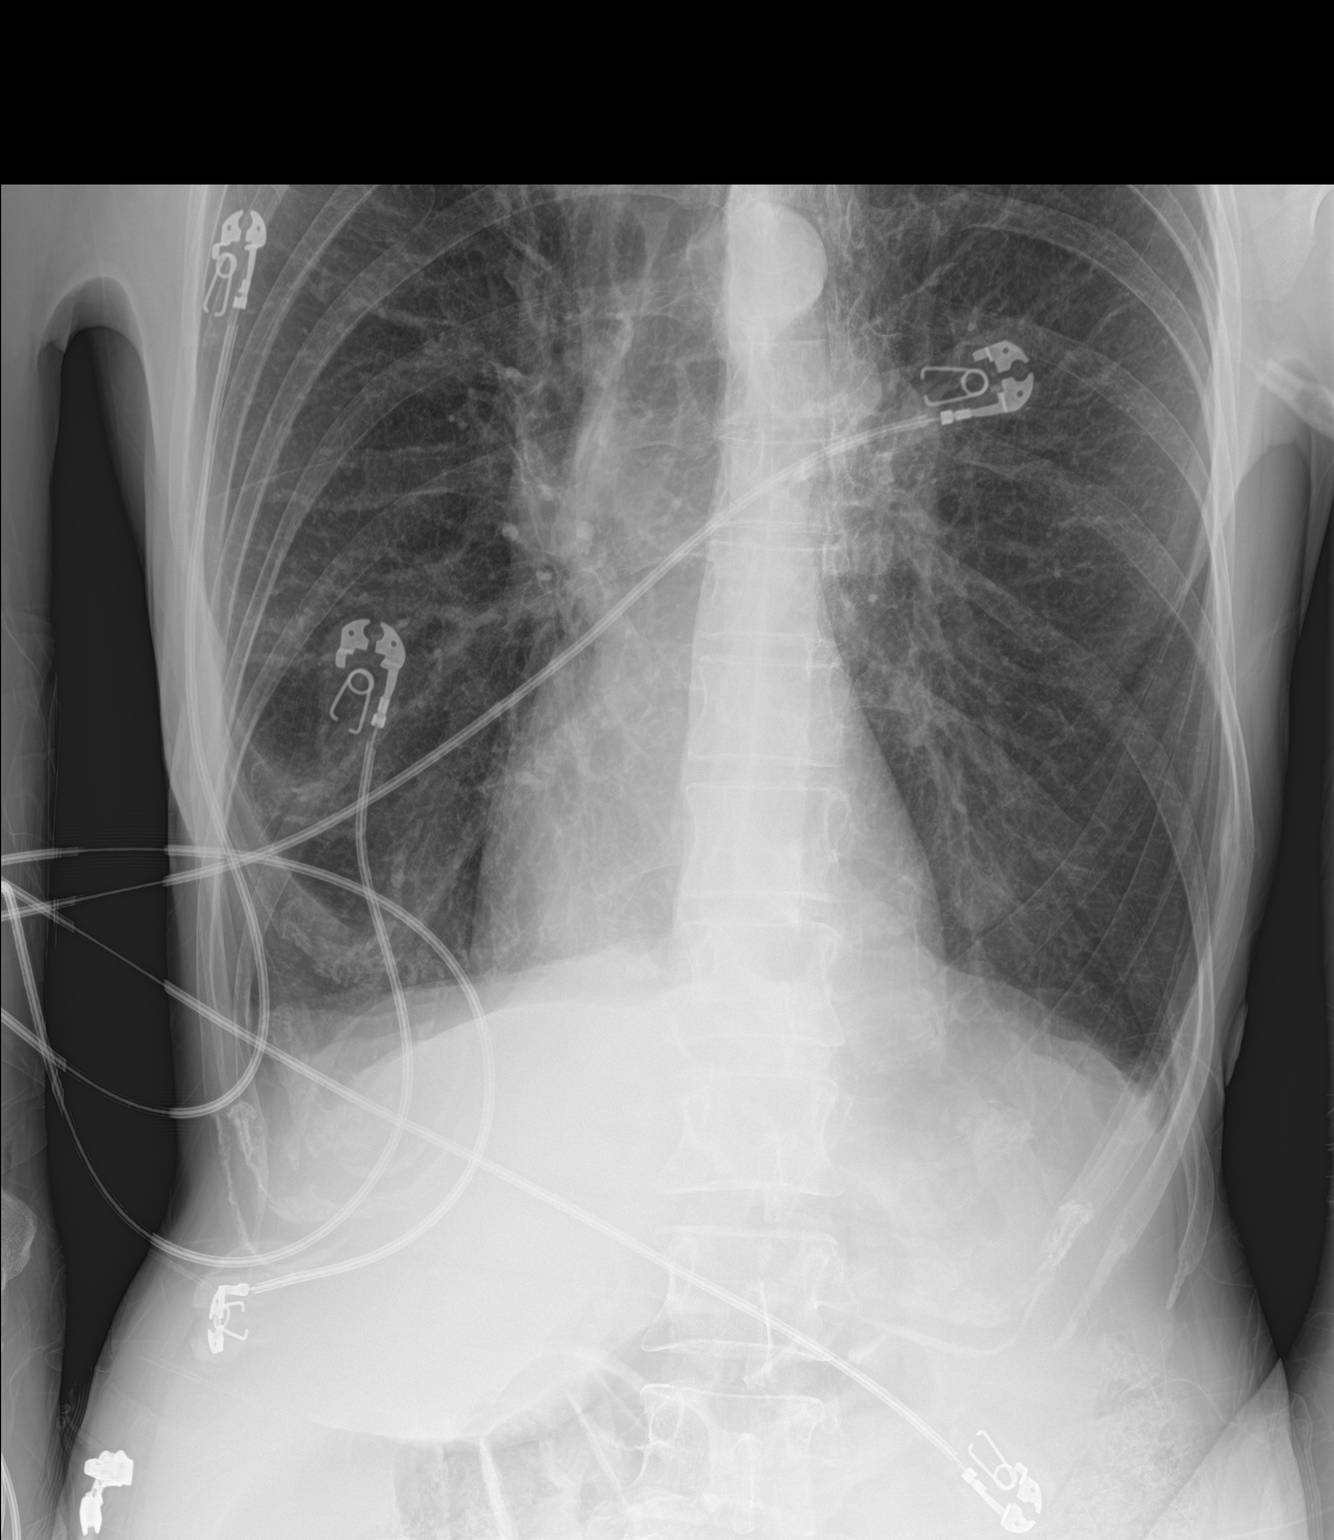

[2 of 2 positions shown; findings below may reference images not displayed]

FINDINGS: Chronic hyperinflation and emphysema. Unchanged heart size and
mediastinal contours. No consolidation, pleural effusion or
pneumothorax. Pulmonary nodule on prior CT is not well seen
radiographically. Normal pulmonary vasculature. No acute osseous
abnormality.
IMPRESSION: Emphysema without acute abnormality.

## 2020-02-14 MED FILL — ATROVENT HFA INHALER: 17 | 25 days supply | Qty: 13 | Fill #5

## 2020-02-14 MED FILL — ALBUTEROL SULFATE HFA 108 (: 108 (90 BAS | 25 days supply | Qty: 18 | Fill #5

## 2020-02-14 NOTE — Progress Notes (Signed)
Patient ID: Elizabeth Villarreal, female   DOB: 11-26-1971, 49 y.o.   MRN: [id number] Next is a 49 year old female seen today in the [place] shelter clinic she complains of a rash is paranasal and she is using inhaled steroid  On exam seen and limits rosacea in the very nasal area and perioral area  I suspect this is on the basis of recent steroid use and is classic rosacea  Plan will be to prescribe benzoyl peroxide-erythromycin gel to apply 2 times daily to affected areas on the face  We will see the patient in return follow-up

## 2020-02-17 MED FILL — DULERA 200 MCG/5 MCG INH: 200-5 | 30 days supply | Qty: 13 | Fill #0

## 2020-02-17 NOTE — Telephone Encounter (Signed)
Please fill if patient may continue the use

## 2020-02-26 NOTE — Progress Notes (Signed)
Ms Bachmann is being seen today in follow up from 11/20 appt with Dr. Earlene Plater. EMBX completed at that visit d/t vaginal bleeding. EMBX was normal Also GYN U/S 11/20 which was significant for a right ovarian cyst and submucosal fibroid  She has no complaints today.  PE AF VSS Lungs clear Heart RRR Abd soft + BS GU vulvar hemangioma's  A/P Right ovarian cyst        Vulvar hemangioma's        Vaginal bleeding.  Will check f/u U/S for ovarian cyst. Will cycle with Prometrium first 10 days of each month

## 2020-02-28 MED FILL — GABAPENTIN 300 MG CAPSULE: 300 | 30 days supply | Qty: 60 | Fill #2

## 2020-02-28 MED FILL — FOLIC ACID 1 MG TABS: 1 | 30 days supply | Qty: 30 | Fill #1

## 2020-03-26 ENCOUNTER — Telehealth: Payer: Self-pay | Admitting: Internal Medicine

## 2020-03-26 MED FILL — ATROVENT HFA INHALER: 17 | 25 days supply | Qty: 13 | Fill #6

## 2020-03-26 MED FILL — DULERA 200 MCG/5 MCG INH: 200-5 | 30 days supply | Qty: 13 | Fill #1

## 2020-03-26 MED FILL — ALBUTEROL SULFATE HFA 108 (: 108 (90 BAS | 25 days supply | Qty: 18 | Fill #6

## 2020-03-26 NOTE — Telephone Encounter (Signed)
Patient came in to drop off form to be filled out by PCP. Patient states that she needs this form for housing. Form will be placed in PCP box.

## 2020-03-27 MED FILL — GABAPENTIN 300 MG CAPSULE: 300 | 30 days supply | Qty: 60 | Fill #3

## 2020-03-27 NOTE — Telephone Encounter (Signed)
Will place in pcp fax folder  

## 2020-04-01 ENCOUNTER — Encounter: Payer: Self-pay | Admitting: Critical Care Medicine

## 2020-04-01 ENCOUNTER — Ambulatory Visit: Payer: Medicaid Other | Attending: Critical Care Medicine | Admitting: Critical Care Medicine

## 2020-04-01 ENCOUNTER — Other Ambulatory Visit: Payer: Self-pay | Admitting: Critical Care Medicine

## 2020-04-01 ENCOUNTER — Other Ambulatory Visit: Payer: Self-pay

## 2020-04-01 ENCOUNTER — Telehealth: Payer: Self-pay

## 2020-04-01 VITALS — BP 110/68 | HR 75 | Temp 96.6°F | Resp 16 | Wt 141.0 lb

## 2020-04-01 DIAGNOSIS — G629 Polyneuropathy, unspecified: Secondary | ICD-10-CM | POA: Diagnosis not present

## 2020-04-01 DIAGNOSIS — Z7952 Long term (current) use of systemic steroids: Secondary | ICD-10-CM | POA: Diagnosis not present

## 2020-04-01 DIAGNOSIS — Z801 Family history of malignant neoplasm of trachea, bronchus and lung: Secondary | ICD-10-CM | POA: Diagnosis not present

## 2020-04-01 DIAGNOSIS — F320 Major depressive disorder, single episode, mild: Secondary | ICD-10-CM

## 2020-04-01 DIAGNOSIS — F32A Depression, unspecified: Secondary | ICD-10-CM

## 2020-04-01 DIAGNOSIS — M79671 Pain in right foot: Secondary | ICD-10-CM | POA: Diagnosis not present

## 2020-04-01 DIAGNOSIS — J4489 Other specified chronic obstructive pulmonary disease: Secondary | ICD-10-CM

## 2020-04-01 DIAGNOSIS — M79672 Pain in left foot: Secondary | ICD-10-CM

## 2020-04-01 DIAGNOSIS — J449 Chronic obstructive pulmonary disease, unspecified: Secondary | ICD-10-CM | POA: Diagnosis present

## 2020-04-01 DIAGNOSIS — N920 Excessive and frequent menstruation with regular cycle: Secondary | ICD-10-CM | POA: Diagnosis not present

## 2020-04-01 DIAGNOSIS — F329 Major depressive disorder, single episode, unspecified: Secondary | ICD-10-CM | POA: Diagnosis not present

## 2020-04-01 DIAGNOSIS — R911 Solitary pulmonary nodule: Secondary | ICD-10-CM

## 2020-04-01 DIAGNOSIS — Z590 Homelessness: Secondary | ICD-10-CM | POA: Diagnosis not present

## 2020-04-01 DIAGNOSIS — G8929 Other chronic pain: Secondary | ICD-10-CM | POA: Insufficient documentation

## 2020-04-01 DIAGNOSIS — Z825 Family history of asthma and other chronic lower respiratory diseases: Secondary | ICD-10-CM | POA: Insufficient documentation

## 2020-04-01 DIAGNOSIS — Z87891 Personal history of nicotine dependence: Secondary | ICD-10-CM | POA: Diagnosis not present

## 2020-04-01 DIAGNOSIS — Z888 Allergy status to other drugs, medicaments and biological substances status: Secondary | ICD-10-CM | POA: Insufficient documentation

## 2020-04-01 DIAGNOSIS — Z79899 Other long term (current) drug therapy: Secondary | ICD-10-CM | POA: Insufficient documentation

## 2020-04-01 DIAGNOSIS — Z7951 Long term (current) use of inhaled steroids: Secondary | ICD-10-CM | POA: Insufficient documentation

## 2020-04-01 DIAGNOSIS — G5793 Unspecified mononeuropathy of bilateral lower limbs: Secondary | ICD-10-CM

## 2020-04-01 LAB — POCT GLYCOSYLATED HEMOGLOBIN (HGB A1C): Hemoglobin A1C: 5.5 % (ref 4.0–5.6)

## 2020-04-01 LAB — GLUCOSE, POCT (MANUAL RESULT ENTRY): POC Glucose: 92 mg/dl (ref 70–99)

## 2020-04-01 MED ORDER — PROGESTERONE 200 MG PO CAPS: 200.0000 mg | ORAL_CAPSULE | Freq: Every day | ORAL | 3 refills | Status: DC

## 2020-04-01 MED ORDER — DULERA 200-5 MCG/ACT IN AERO: 2.0000 | INHALATION_SPRAY | Freq: Two times a day (BID) | RESPIRATORY_TRACT | 2 refills | Status: DC

## 2020-04-01 MED ORDER — CYANOCOBALAMIN 500 MCG PO TABS: 500.0000 ug | ORAL_TABLET | Freq: Every day | ORAL | 1 refills | Status: DC

## 2020-04-01 MED ORDER — FOLIC ACID 1 MG PO TABS: 1.0000 mg | ORAL_TABLET | Freq: Every day | ORAL | 1 refills | Status: DC

## 2020-04-01 MED ORDER — GABAPENTIN 300 MG PO CAPS: 300.0000 mg | ORAL_CAPSULE | Freq: Two times a day (BID) | ORAL | 3 refills | Status: DC

## 2020-04-01 MED ORDER — ATROVENT HFA 17 MCG/ACT IN AERS: 2.0000 | INHALATION_SPRAY | Freq: Four times a day (QID) | RESPIRATORY_TRACT | 12 refills | Status: DC

## 2020-04-01 MED FILL — FOLIC ACID 1 MG TABS: 1 | 30 days supply | Qty: 30 | Fill #0

## 2020-04-01 MED FILL — PROGESTERONE 200 MG CAPSULE: 200 | 30 days supply | Qty: 30 | Fill #0

## 2020-04-01 NOTE — Patient Instructions (Signed)
Refills on medications sent to our pharmacy  A referral to podiatry will be made  Return 4 months

## 2020-04-01 NOTE — Telephone Encounter (Signed)
Met with the patient when she was in the clinic today.  She said that she remains at [facility] but is anxious to have her own apartment and is planning to have a friend move in with her.  She said that she contacted [facility] last week, was screened and was told she is on the wait list but she is not sure exactly where on the list. Explained to her that this CM will contact [facility] to inquire about more details regarding wait list time frame.   The patient has access to listing available on [email or web address] she has also been in contact with other housing resources in [place] and has even considered relocating outside of [facility].

## 2020-04-01 NOTE — Assessment & Plan Note (Signed)
Chronic pain in both feet but normal circulation with normal ABIs  Will refer to podiatry for further evaluation

## 2020-04-01 NOTE — Progress Notes (Signed)
Subjective:    Patient ID: Elizabeth Villarreal, female    DOB: 02/08/1971, 49 y.o.   MRN: [id number]   This is a 49 year old female actually seen previously at community health and wellness clinic in September 2019.  Patient has a history of COPD but actually more recently was admitted for 9 days at [facility] for right upper lobe pneumonia and sepsis.  The patient actually left the [place] area and went to live in [place] with a relative but when they lost housing there she returned to the area during the midst of the Covid crisis and was in a quarantine at the remodel and and then from there came to the homeless shelter where she has now been for several weeks.  The patient's issue today is that she is run out of her medications and needs follow-up.  She also needs follow-up on the fact that she has had pneumonia.  Below is a copy of the discharge summary She was admitted September 22nd through October 1 History of present illness: -49 y.o.femalewithhistory of COPD previous tobacco abuse recently placed on gabapentin for neuropathy which patient has not taken presents to the ER with complaint of fever chills. Patient states her symptoms started this morning and has not had any productive cough until patient reached ER. Has not had any recent travel but has been living in the shelter for last 1 month  Hospital Course:  1. Sepsis/community-acquired pneumonia -Secondary to community-acquired pneumonia, clinically improved significantly with IV ceftriaxone and azithromycin, sputum cultures were negative -COVID-19 PCR is negative -Given upper lobe infiltrates and as she lives in a shelter,a QuantiFERON-TB gold was sent out on admission, this was noted to be in the indeterminant, case was discussed by my partner with infectious disease, subsequently had AFB smears sent which were negative -Hence no further work-up was not felt to be indicated  2.COPD exacerbation, pneumonia    -Clinically improved as above, transition to prednisone taper and oral antibiotics to complete course -Recommend follow-up chest x-ray in 4 to 6 weeks  3.Peripheral neuropathy, continue gabapentin  4.Tobacco abuse -Quit smoking 2 years ago, counseled   Currently her shortness of breath is improved.  She is not able to use the nebulizer and homeless shelter because of aerosolization is not asking for an HFA substitute.  She is off prednisone.  She states prednisone has been helpful to her.   On exam blood pressure is 118/72 temps 97 saturation 96% room air pulse 80 chest show distant breath sounds without wheeze rale or rhonchi cardiac exam showed a regular rate rhythm without S3-S4 normal S1-S2 abdomen soft nontender extremities show no edema  Impression is that of COPD asthmatic bronchitic component  I will refill the patient's prednisone at a dose of 5 mg daily we will also refill her Dulera 2 but increase the strength to 200 mcg at 2 puffs twice daily as well she will receive an Atrovent inhaler at 2 puffs 4 times daily in place of her ipratropium minutes in the nebulizer   We will also establish her back into the health and wellness clinic and obtain a chest x-ray as well to follow-up the right upper lobe pneumonia to ensure it has cleared   10/07/2019 The patient returns to reestablish in the clinic after having moved away to [place] for a 38-month interval having returned to this area 2 months ago.  The patient currently is homeless and staying at the [facility] homeless shelter.  She recently was admitted  as noted above for COPD exacerbation.  We then saw her back in the homeless shelter clinic and restarted the [facility] and Atrovent inhalers and the patient finished her course of prednisone and now is maintaining 5 mg daily of prednisone as a maintenance.  The patient states her breathing is improved.  She is not smoked in 2 years.  She had a repeat chest x-ray which  showed improvement in the right upper lobe pneumonia.  There is still some small cavitary change in the right upper lobe and left lower lobe nodule seen.  The patient needs a repeat CT scan of the chest for the same.  Currently there is no cough there is some chest tightness and soreness behind the shoulder blades.  She is still working on housing.  Note during the hospitalization the patient did receive a flu vaccine    04/01/2020 Since the last visit in November the patient had developed increased calf pain and claudication in both lower extremities.  A follow-up ABI was performed and was normal for circulatory issues.  The patient does underlying COPD and was also concerned about diabetes.  She comes into the office today with a blood glucose of 92 and hemoglobin A1c of 5.4 She denies any rash.  She feels that she is having more fatigue at this time.  She is working on obtaining a job and has applied with partners for ending homelessness for assistance in obtaining housing Note CT of the chest in November showed resolution of pneumonia and no active nodule seen   Past Medical History:  Diagnosis Date  . CAP (community acquired pneumonia) 08/27/2019  . COPD (chronic obstructive pulmonary disease) ([place])   . Depression   . Tobacco abuse      Family History  Problem Relation Age of Onset  . COPD Mother   . Lung cancer Father      Social History   Socioeconomic History  . Marital status: Single    Spouse name: Not on file  . Number of children: Not on file  . Years of education: Not on file  . Highest education level: Not on file  Occupational History  . Not on file  Tobacco Use  . Smoking status: Former Smoker    Packs/day: 1.00    Types: Cigarettes    Quit date: 01/15/2018    Years since quitting: 2.2  . Smokeless tobacco: Never Used  Substance and Sexual Activity  . Alcohol use: No  . Drug use: No  . Sexual activity: Not Currently    Birth control/protection: None    Other Topics Concern  . Not on file  Social History Narrative  . Not on file   Social Determinants of Health   Financial Resource Strain:   . Difficulty of Paying Living Expenses:   Food Insecurity:   . Worried About Charity fundraiser in the Last Year:   . Arboriculturist in the Last Year:   Transportation Needs:   . Film/video editor (Medical):   Marland Kitchen Lack of Transportation (Non-Medical):   Physical Activity:   . Days of Exercise per Week:   . Minutes of Exercise per Session:   Stress:   . Feeling of Stress :   Social Connections:   . Frequency of Communication with Friends and Family:   . Frequency of Social Gatherings with Friends and Family:   . Attends Religious Services:   . Active Member of Clubs or Organizations:   . Attends Club  or Organization Meetings:   Marland Kitchen Marital Status:   Intimate Partner Violence:   . Fear of Current or Ex-Partner:   . Emotionally Abused:   Marland Kitchen Physically Abused:   . Sexually Abused:      Allergies  Allergen Reactions  . Darvon [Propoxyphene] Other (See Comments)    Patient was aware her spoken words were not matching her thoughts     Outpatient Medications Prior to Visit  Medication Sig Dispense Refill  . acetaminophen (TYLENOL) 325 MG tablet Take 650 mg by mouth every 6 (six) hours as needed for mild pain.    Marland Kitchen albuterol (PROAIR HFA) 108 (90 Base) MCG/ACT inhaler Inhale 1-2 puffs into the lungs every 6 (six) hours as needed for wheezing or shortness of breath. 18 g 6  . benzoyl peroxide-erythromycin (BENZAMYCIN) gel Apply topically 2 (two) times daily. To affected areas on face 23.3 g 0  . DULERA 200-5 MCG/ACT AERO INHALE 2 PUFFS INTO THE LUNGS 2 (TWO) TIMES DAILY. 13 g 2  . folic acid (FOLVITE) 1 MG tablet Take 1 tablet (1 mg total) by mouth daily. 90 tablet 1  . gabapentin (NEURONTIN) 300 MG capsule Take 1 capsule (300 mg total) by mouth 2 (two) times daily. 60 capsule 3  . ipratropium (ATROVENT HFA) 17 MCG/ACT inhaler Inhale 2  puffs into the lungs every 6 (six) hours. 1 Inhaler 12  . predniSONE (DELTASONE) 5 MG tablet Take 5 mg by mouth daily with breakfast.    . progesterone (PROMETRIUM) 200 MG capsule Take 1 capsule (200 mg total) by mouth as directed. Take a capsule first 10 days of each month 30 capsule 2  . vitamin B-12 (CYANOCOBALAMIN) 500 MCG tablet Take 1 tablet (500 mcg total) by mouth daily. 90 tablet 1  . albuterol (PROVENTIL) (2.5 MG/3ML) 0.083% nebulizer solution USE ONE VIAL VIA NEBULIZER EVERY SIX HOURS AS NEEDED FOR WHEEZING OR SHORTNESS OF BREATH WITH THE IPRATROPIUM (Patient not taking: Reported on 04/01/2020) 375 mL 2  . ipratropium (ATROVENT) 0.02 % nebulizer solution Take 2.5 mLs (0.5 mg total) by nebulization 4 (four) times daily. (Patient not taking: Reported on 04/01/2020) 300 mL 2   No facility-administered medications prior to visit.    Review of Systems  Constitutional:   No  weight loss, night sweats,  Fevers, chills, fatigue, lassitude. HEENT:   No headaches,  Difficulty swallowing,  Tooth/dental problems,  Sore throat,                No sneezing, itching, ear ache, nasal congestion, post nasal drip,   CV:  No chest pain,  Orthopnea, PND, swelling in lower extremities, anasarca, dizziness, palpitations  GI  No heartburn, indigestion, abdominal pain, nausea, vomiting, diarrhea, change in bowel habits, loss of appetite  Resp:  shortness of breath with exertion or at rest.  No excess mucus, no productive cough,  No non-productive cough,  No coughing up of blood.  No change in color of mucus.  No wheezing.  No chest wall deformity  Skin: no rash or lesions.  GU: no dysuria, change in color of urine, no urgency or frequency.  No flank pain.  MS:  No joint pain or swelling.  No decreased range of motion.  No back pain.  Psych:  No change in mood or affect. No depression or anxiety.  No memory loss.     Objective:   Physical Exam  Vitals:   04/01/20 0945  BP: 110/68  Pulse: 75    Resp: 16  Temp: (!) 96.6 F (35.9 C)  SpO2: 95%  Weight: 141 lb (64 kg)    Gen: Pleasant, well-nourished, in no distress,  normal affect  ENT: No lesions,  mouth clear,  oropharynx clear, no postnasal drip  Neck: No JVD, no TMG, no carotid bruits  Lungs: No use of accessory muscles, no dullness to percussion, hyperresonance to percussion and decreased breath sounds  Cardiovascular: RRR, heart sounds normal, no murmur or gallops, no peripheral edema, the forefoot is cold compared to the mid and hindfoot bilaterally however pulses both dorsal modalities and posterior tibialis are normal and ABI recently was normal  Abdomen: soft and NT, no HSM,  BS normal  Musculoskeletal: No deformities, no cyanosis or clubbing No deformity in the feet are observed Neuro: alert, non focal  Skin: Warm, no lesions or rashes   CXR 10/30: IMPRESSION: 1. Near complete resolution of right upper lobe pneumonia. Small somewhat irregular opacity remains in the right upper lobe, potentially with central cavitation. One additional short interval radiographic follow-up could be performed to assess for resolution, alternatively chest CT could be obtained given underlying emphysema. 2. Small focal opacity at the lingula, atelectasis versus minimal infiltrate 3. Hyperinflated lungs with emphysema  Lab Results  Component Value Date   HGBA1C 5.4 07/22/2019        Assessment & Plan:  I personally reviewed all images and lab data in the [facility] system as well as any outside material available during this office visit and agree with the  radiology impressions.   COPD GOLD D  with chronic bronchitis (HCC) Gold stage D COPD stable at this time  Continue the [facility] twice daily and will add Atrovent 2 puffs 4 times daily  Lung nodule seen on imaging study Lung nodule seen is not likely malignancy does not need further follow-up  Menorrhagia with regular cycle Menorrhagia with normal menstrual  cycle  We will continue progesterone as prescribed and will refill same  Pain in both feet Chronic pain in both feet but normal circulation with normal ABIs  Will refer to podiatry for further evaluation   Elizabeth Villarreal was seen today for follow-up.  Diagnoses and all orders for this visit:  COPD GOLD D  with chronic bronchitis (HCC)  Pain in both feet -     Glucose (CBG) -     HgB A1c -     Ambulatory referral to Podiatry  Neuropathic pain of both feet -     Ambulatory referral to Podiatry -     gabapentin (NEURONTIN) 300 MG capsule; Take 1 capsule (300 mg total) by mouth 2 (two) times daily.  Mild depression (HCC)  Former smoker  Lung nodule seen on imaging study  Menorrhagia with regular cycle  Other orders -     mometasone-formoterol (DULERA) 200-5 MCG/ACT AERO; Inhale 2 puffs into the lungs 2 (two) times daily. -     folic acid (FOLVITE) 1 MG tablet; Take 1 tablet (1 mg total) by mouth daily. -     ipratropium (ATROVENT HFA) 17 MCG/ACT inhaler; Inhale 2 puffs into the lungs every 6 (six) hours. -     progesterone (PROMETRIUM) 200 MG capsule; Take 1 capsule (200 mg total) by mouth daily. -     vitamin B-12 (CYANOCOBALAMIN) 500 MCG tablet; Take 1 tablet (500 mcg total) by mouth daily.   Note patient has had her Covid vaccine

## 2020-04-01 NOTE — Assessment & Plan Note (Signed)
Lung nodule seen is not likely malignancy does not need further follow-up

## 2020-04-01 NOTE — Progress Notes (Signed)
Screening for DM Bilateral foot pain x several mos.  Has sensation in calf muscle like a bug is crawling on her.

## 2020-04-01 NOTE — Assessment & Plan Note (Signed)
Menorrhagia with normal menstrual cycle  We will continue progesterone as prescribed and will refill same

## 2020-04-01 NOTE — Assessment & Plan Note (Signed)
Gold stage D COPD stable at this time  Continue the [facility] twice daily and will add Atrovent 2 puffs 4 times daily

## 2020-04-02 NOTE — Telephone Encounter (Signed)
Contacted pt and made aware that form is ready for pick up pt states she will be by tomorrow to pick it up

## 2020-04-17 ENCOUNTER — Ambulatory Visit (INDEPENDENT_AMBULATORY_CARE_PROVIDER_SITE_OTHER): Payer: Medicaid Other

## 2020-04-17 ENCOUNTER — Other Ambulatory Visit: Payer: Self-pay

## 2020-04-17 ENCOUNTER — Ambulatory Visit: Payer: Medicaid Other | Admitting: Podiatry

## 2020-04-17 DIAGNOSIS — G5761 Lesion of plantar nerve, right lower limb: Secondary | ICD-10-CM

## 2020-04-17 DIAGNOSIS — G5762 Lesion of plantar nerve, left lower limb: Secondary | ICD-10-CM | POA: Diagnosis not present

## 2020-04-17 DIAGNOSIS — G5763 Lesion of plantar nerve, bilateral lower limbs: Secondary | ICD-10-CM

## 2020-04-17 DIAGNOSIS — M7741 Metatarsalgia, right foot: Secondary | ICD-10-CM

## 2020-04-17 DIAGNOSIS — M7742 Metatarsalgia, left foot: Secondary | ICD-10-CM

## 2020-04-17 DIAGNOSIS — M79672 Pain in left foot: Secondary | ICD-10-CM

## 2020-04-17 DIAGNOSIS — M79671 Pain in right foot: Secondary | ICD-10-CM

## 2020-04-17 NOTE — Progress Notes (Signed)
  Subjective:  Patient ID: Elizabeth Villarreal, female    DOB: [birth year],  MRN: [id number]  Chief Complaint  Patient presents with  . Peripheral Neuropathy    Bilateral numbness and hot/cold freezing sensation in feet, 2 years duration. Pt states diagnosis with neuropathy.    49 y.o. female presents with the above complaint. History confirmed with patient.   Objective:  Physical Exam: warm, good capillary refill, no trophic changes or ulcerative lesions, normal DP and PT pulses and normal sensory exam. POP 3rd interspace bilat with mulder's click No images are attached to the encounter.  Radiographs: X-ray of both feet: no fracture, dislocation, swelling or degenerative changes noted Assessment:   1. Morton's neuroma of both feet   2. Metatarsalgia of both feet    Plan:  Patient was evaluated and treated and all questions answered.  Morton Neuroma -Educated on etiology -Educated on padding and proper shoegear -XR reviewed with patient -Injection delivered to the affected interspaces  Procedure: Neuroma Injection Location: Bilateral 3rd interspace Skin Prep: Alcohol. Injectate: 0.5 cc 0.5% marcaine plain, 0.5 cc dexamethasone phosphate. Disposition: Patient tolerated procedure well. Injection site dressed with a band-aid.  Return in about 6 weeks (around 05/29/2020) for Neuroma.

## 2020-04-21 ENCOUNTER — Other Ambulatory Visit: Payer: Self-pay | Admitting: Podiatry

## 2020-04-21 DIAGNOSIS — G5761 Lesion of plantar nerve, right lower limb: Secondary | ICD-10-CM

## 2020-04-21 DIAGNOSIS — G5762 Lesion of plantar nerve, left lower limb: Secondary | ICD-10-CM

## 2020-04-29 ENCOUNTER — Telehealth: Payer: Self-pay

## 2020-04-29 MED FILL — ATROVENT HFA INHALER: 17 | 25 days supply | Qty: 13 | Fill #7

## 2020-04-29 NOTE — Telephone Encounter (Signed)
Pt brought disability form back to office for [facility] to finish filling it out. Placed in providers bin.

## 2020-05-01 ENCOUNTER — Other Ambulatory Visit: Payer: Self-pay | Admitting: Critical Care Medicine

## 2020-05-01 DIAGNOSIS — J449 Chronic obstructive pulmonary disease, unspecified: Secondary | ICD-10-CM

## 2020-05-01 MED FILL — GABAPENTIN 300 MG CAPSULE: 300 | 30 days supply | Qty: 60 | Fill #0

## 2020-05-01 MED FILL — DULERA 200 MCG/5 MCG INH: 200-5 | 30 days supply | Qty: 13 | Fill #2

## 2020-05-01 MED FILL — GABAPENTIN 300 MG CAPSULE: 300 | 30 days supply | Qty: 60 | Fill #1

## 2020-05-05 MED FILL — PROAIR HFA 90 MCG INHALER: 108 (90 BAS | 25 days supply | Qty: 9 | Fill #0

## 2020-05-06 ENCOUNTER — Ambulatory Visit: Payer: Medicaid Other | Admitting: Obstetrics and Gynecology

## 2020-05-28 MED FILL — ATROVENT HFA INHALER: 17 | 25 days supply | Qty: 13 | Fill #8

## 2020-05-29 MED FILL — GABAPENTIN 300 MG CAPSULE: 300 | 30 days supply | Qty: 60 | Fill #2

## 2020-05-29 MED FILL — DULERA 200 MCG/5 MCG INH: 200-5 | 30 days supply | Qty: 13 | Fill #0

## 2020-05-29 MED FILL — PROAIR HFA 90 MCG INHALER: 108 (90 BAS | 30 days supply | Qty: 9 | Fill #2

## 2020-06-04 ENCOUNTER — Ambulatory Visit (INDEPENDENT_AMBULATORY_CARE_PROVIDER_SITE_OTHER): Payer: Medicaid Other | Admitting: Podiatry

## 2020-06-04 DIAGNOSIS — Z5329 Procedure and treatment not carried out because of patient's decision for other reasons: Secondary | ICD-10-CM

## 2020-06-04 NOTE — Progress Notes (Signed)
No show

## 2020-06-15 ENCOUNTER — Telehealth: Payer: Self-pay | Admitting: Internal Medicine

## 2020-06-15 NOTE — Telephone Encounter (Signed)
Phone call received from on-call nurse 06/13/2020.  Patient had called stating that she normally gets prescription for [facility] and proair from our pharmacy but we are close on the weekend and she is about to run out.  She requests prescription to be sent to [facility] to last until she can get to our pharmacy.  Refills called in on proair with 2 refills and Dulera 200/5 with 2 refills.

## 2020-06-19 MED FILL — predniSONE 10 MG TABS: 10 | 10 days supply | Qty: 5 | Fill #2

## 2020-06-19 MED FILL — ATROVENT HFA INHALER: 17 | 25 days supply | Qty: 13 | Fill #9

## 2020-06-30 ENCOUNTER — Other Ambulatory Visit: Payer: Self-pay | Admitting: Internal Medicine

## 2020-06-30 DIAGNOSIS — J449 Chronic obstructive pulmonary disease, unspecified: Secondary | ICD-10-CM

## 2020-06-30 DIAGNOSIS — G5793 Unspecified mononeuropathy of bilateral lower limbs: Secondary | ICD-10-CM

## 2020-06-30 MED ORDER — GABAPENTIN 300 MG PO CAPS: 300.0000 mg | ORAL_CAPSULE | Freq: Two times a day (BID) | ORAL | 0 refills | Status: DC

## 2020-06-30 MED ORDER — ATROVENT HFA 17 MCG/ACT IN AERS: 2.0000 | INHALATION_SPRAY | Freq: Four times a day (QID) | RESPIRATORY_TRACT | 0 refills | Status: DC

## 2020-06-30 MED ORDER — ALBUTEROL SULFATE HFA 108 (90 BASE) MCG/ACT IN AERS: 1.0000 | INHALATION_SPRAY | Freq: Four times a day (QID) | RESPIRATORY_TRACT | 0 refills | Status: DC | PRN

## 2020-06-30 MED FILL — GABAPENTIN 300 MG CAPSULE: 300 | 30 days supply | Qty: 60 | Fill #0

## 2020-06-30 NOTE — Telephone Encounter (Signed)
Medication Refill - Medication: albuterol (VENTOLIN HFA) 108 (90 Base) MCG/ACT inhaler [[id number]]  gabapentin (NEURONTIN) 300 MG capsule [[id number]]   ipratropium (ATROVENT HFA) 17 MCG/ACT inhaler [[id number]]    Preferred Pharmacy (with phone number or street name):  [facility] - [place], [place] - [address]  [address] [place] [address]  Phone: [phone] Fax: [phone]  Hours: Not open 24 hours     Agent: Please be advised that RX refills may take up to 3 business days. We ask that you follow-up with your pharmacy.

## 2020-07-01 ENCOUNTER — Ambulatory Visit: Payer: Medicaid Other | Admitting: Critical Care Medicine

## 2020-07-01 NOTE — Progress Notes (Deleted)
Subjective:    Patient ID: Elizabeth Villarreal, female    DOB: 02/08/1971, 49 y.o.   MRN: [id number]   This is a 49 year old female actually seen previously at community health and wellness clinic in September 2019.  Patient has a history of COPD but actually more recently was admitted for 9 days at [facility] for right upper lobe pneumonia and sepsis.  The patient actually left the [place] area and went to live in [place] with a relative but when they lost housing there she returned to the area during the midst of the Covid crisis and was in a quarantine at the remodel and and then from there came to the homeless shelter where she has now been for several weeks.  The patient's issue today is that she is run out of her medications and needs follow-up.  She also needs follow-up on the fact that she has had pneumonia.  Below is a copy of the discharge summary She was admitted September 22nd through October 1 History of present illness: -49 y.o.femalewithhistory of COPD previous tobacco abuse recently placed on gabapentin for neuropathy which patient has not taken presents to the ER with complaint of fever chills. Patient states her symptoms started this morning and has not had any productive cough until patient reached ER. Has not had any recent travel but has been living in the shelter for last 1 month  Hospital Course:  1. Sepsis/community-acquired pneumonia -Secondary to community-acquired pneumonia, clinically improved significantly with IV ceftriaxone and azithromycin, sputum cultures were negative -COVID-19 PCR is negative -Given upper lobe infiltrates and as she lives in a shelter,a QuantiFERON-TB gold was sent out on admission, this was noted to be in the indeterminant, case was discussed by my partner with infectious disease, subsequently had AFB smears sent which were negative -Hence no further work-up was not felt to be indicated  2.COPD exacerbation, pneumonia    -Clinically improved as above, transition to prednisone taper and oral antibiotics to complete course -Recommend follow-up chest x-ray in 4 to 6 weeks  3.Peripheral neuropathy, continue gabapentin  4.Tobacco abuse -Quit smoking 2 years ago, counseled   Currently her shortness of breath is improved.  She is not able to use the nebulizer and homeless shelter because of aerosolization is not asking for an HFA substitute.  She is off prednisone.  She states prednisone has been helpful to her.   On exam blood pressure is 118/72 temps 97 saturation 96% room air pulse 80 chest show distant breath sounds without wheeze rale or rhonchi cardiac exam showed a regular rate rhythm without S3-S4 normal S1-S2 abdomen soft nontender extremities show no edema  Impression is that of COPD asthmatic bronchitic component  I will refill the patient's prednisone at a dose of 5 mg daily we will also refill her Dulera 2 but increase the strength to 200 mcg at 2 puffs twice daily as well she will receive an Atrovent inhaler at 2 puffs 4 times daily in place of her ipratropium minutes in the nebulizer   We will also establish her back into the health and wellness clinic and obtain a chest x-ray as well to follow-up the right upper lobe pneumonia to ensure it has cleared   10/07/2019 The patient returns to reestablish in the clinic after having moved away to [place] for a 38-month interval having returned to this area 2 months ago.  The patient currently is homeless and staying at the [facility] shelter.  She recently was admitted  as noted above for COPD exacerbation.  We then saw her back in the homeless shelter clinic and [facility] and the patient finished her course of prednisone and now is maintaining 5 mg daily of prednisone as a maintenance.  The patient states her breathing is improved.  She is not smoked in 2 years.  She had a repeat chest x-ray which  showed improvement in the right upper lobe pneumonia.  There is still some small cavitary change in the right upper lobe and left lower lobe nodule seen.  The patient needs a repeat CT scan of the chest for the same.  Currently there is no cough there is some chest tightness and soreness behind the shoulder blades.  She is still working on housing.  Note during the hospitalization the patient did receive a flu vaccine    04/01/2020 Since the last visit in November the patient had developed increased calf pain and claudication in both lower extremities.  A follow-up ABI was performed and was normal for circulatory issues.  The patient does underlying COPD and was also concerned about diabetes.  She comes into the office today with a blood glucose of 92 and hemoglobin A1c of 5.4 She denies any rash.  She feels that she is having more fatigue at this time.  She is working on obtaining a job and has applied with partners for ending homelessness for assistance in obtaining housing Note CT of the chest in November showed resolution of pneumonia and no active nodule seen   Past Medical History:  Diagnosis Date  . CAP (community acquired pneumonia) 08/27/2019  . COPD (chronic obstructive pulmonary disease) (HCC)   . Depression   . Tobacco abuse      Family History  Problem Relation Age of Onset  . COPD Mother   . Lung cancer Father      Social History   Socioeconomic History  . Marital status: Single    Spouse name: Not on file  . Number of children: Not on file  . Years of education: Not on file  . Highest education level: Not on file  Occupational History  . Not on file  Tobacco Use  . Smoking status: Former Smoker    Packs/day: 1.00    Types: Cigarettes    Quit date: 01/15/2018    Years since quitting: 2.4  . Smokeless tobacco: Never Used  Vaping Use  . Vaping Use: Former  Substance and Sexual Activity  . Alcohol use: No  . Drug use: No  . Sexual activity: Not Currently     Birth control/protection: None  Other Topics Concern  . Not on file  Social History Narrative  . Not on file   Social Determinants of Health   Financial Resource Strain:   . Difficulty of Paying Living Expenses:   Food Insecurity:   . Worried About Programme researcher, broadcasting/film/videounning Out of Food in the Last Year:   . Baristaan Out of Food in the Last Year:   Transportation Needs:   . Freight forwarderLack of Transportation (Medical):   Marland Kitchen. Lack of Transportation (Non-Medical):   Physical Activity:   . Days of Exercise per Week:   . Minutes of Exercise per Session:   Stress:   . Feeling of Stress :   Social Connections:   . Frequency of Communication with Friends and Family:   . Frequency of Social Gatherings with Friends and Family:   . Attends Religious Services:   . Active Member of Clubs  or Organizations:   . Attends Banker Meetings:   Marland Kitchen Marital Status:   Intimate Partner Violence:   . Fear of Current or Ex-Partner:   . Emotionally Abused:   Marland Kitchen Physically Abused:   . Sexually Abused:      Allergies  Allergen Reactions  . Darvon [Propoxyphene] Other (See Comments)    Patient was aware her spoken words were not matching her thoughts     Outpatient Medications Prior to Visit  Medication Sig Dispense Refill  . albuterol (VENTOLIN HFA) 108 (90 Base) MCG/ACT inhaler Inhale 1-2 puffs into the lungs every 6 (six) hours as needed for wheezing or shortness of breath. 18 g 0  . benzoyl peroxide-erythromycin (BENZAMYCIN) gel Apply topically 2 (two) times daily. To affected areas on face 23.3 g 0  . folic acid (FOLVITE) 1 MG tablet Take 1 tablet (1 mg total) by mouth daily. 90 tablet 1  . gabapentin (NEURONTIN) 300 MG capsule Take 1 capsule (300 mg total) by mouth 2 (two) times daily. 60 capsule 0  . ipratropium (ATROVENT HFA) 17 MCG/ACT inhaler Inhale 2 puffs into the lungs every 6 (six) hours. 1 Inhaler 0  . mometasone-formoterol (DULERA) 200-5 MCG/ACT AERO Inhale 2 puffs into the lungs 2 (two) times daily. 13 g 2    . progesterone (PROMETRIUM) 200 MG capsule Take 1 capsule (200 mg total) by mouth daily. 30 capsule 3  . vitamin B-12 (CYANOCOBALAMIN) 500 MCG tablet Take 1 tablet (500 mcg total) by mouth daily. 90 tablet 1  . acetaminophen (TYLENOL) 325 MG tablet Take 650 mg by mouth every 6 (six) hours as needed for mild pain.     No facility-administered medications prior to visit.    Review of Systems Constitutional:   No  weight loss, night sweats,  Fevers, chills, fatigue, lassitude. HEENT:   No headaches,  Difficulty swallowing,  Tooth/dental problems,  Sore throat,                No sneezing, itching, ear ache, nasal congestion, post nasal drip,   CV:  No chest pain,  Orthopnea, PND, swelling in lower extremities, anasarca, dizziness, palpitations  GI  No heartburn, indigestion, abdominal pain, nausea, vomiting, diarrhea, change in bowel habits, loss of appetite  Resp:  shortness of breath with exertion or at rest.  No excess mucus, no productive cough,  No non-productive cough,  No coughing up of blood.  No change in color of mucus.  No wheezing.  No chest wall deformity  Skin: no rash or lesions.  GU: no dysuria, change in color of urine, no urgency or frequency.  No flank pain.  MS:  No joint pain or swelling.  No decreased range of motion.  No back pain.  Psych:  No change in mood or affect. No depression or anxiety.  No memory loss.     Objective:   Physical Exam There were no vitals filed for this visit.  Gen: Pleasant, well-nourished, in no distress,  normal affect  ENT: No lesions,  mouth clear,  oropharynx clear, no postnasal drip  Neck: No JVD, no TMG, no carotid bruits  Lungs: No use of accessory muscles, no dullness to percussion, hyperresonance to percussion and decreased breath sounds  Cardiovascular: RRR, heart sounds normal, no murmur or gallops, no peripheral edema, the forefoot is cold compared to the mid and hindfoot bilaterally however pulses both dorsal  modalities and posterior tibialis are normal and ABI recently was normal  Abdomen: soft and NT,  no HSM,  BS normal  Musculoskeletal: No deformities, no cyanosis or clubbing No deformity in the feet are observed Neuro: alert, non focal  Skin: Warm, no lesions or rashes   CXR 10/30: IMPRESSION: 1. Near complete resolution of right upper lobe pneumonia. Small somewhat irregular opacity remains in the right upper lobe, potentially with central cavitation. One additional short interval radiographic follow-up could be performed to assess for resolution, alternatively chest CT could be obtained given underlying emphysema. 2. Small focal opacity at the lingula, atelectasis versus minimal infiltrate 3. Hyperinflated lungs with emphysema  Lab Results  Component Value Date   HGBA1C 5.5 04/01/2020        Assessment & Plan:  I personally reviewed all images and lab data in the [facility] system as well as any outside material available during this office visit and agree with the  radiology impressions.   No problem-specific Assessment & Plan notes found for this encounter.   Diagnoses and all orders for this visit:  COPD GOLD D  with chronic bronchitis (HCC)   Note patient has had her Covid vaccine

## 2020-07-02 MED FILL — PROAIR HFA 90 MCG INHALER: 108 (90 BAS | 25 days supply | Qty: 9 | Fill #0

## 2020-07-27 ENCOUNTER — Other Ambulatory Visit: Payer: Self-pay | Admitting: Internal Medicine

## 2020-07-27 DIAGNOSIS — J449 Chronic obstructive pulmonary disease, unspecified: Secondary | ICD-10-CM

## 2020-07-27 DIAGNOSIS — G5793 Unspecified mononeuropathy of bilateral lower limbs: Secondary | ICD-10-CM

## 2020-07-27 MED ORDER — ATROVENT HFA 17 MCG/ACT IN AERS: 2.0000 | INHALATION_SPRAY | Freq: Four times a day (QID) | RESPIRATORY_TRACT | 0 refills | Status: DC

## 2020-07-27 MED ORDER — ALBUTEROL SULFATE HFA 108 (90 BASE) MCG/ACT IN AERS
1.0000 | INHALATION_SPRAY | Freq: Four times a day (QID) | RESPIRATORY_TRACT | 0 refills | Status: DC | PRN
Start: 2020-07-27 — End: 2020-07-27

## 2020-07-27 MED ORDER — GABAPENTIN 300 MG PO CAPS: 300.0000 mg | ORAL_CAPSULE | Freq: Two times a day (BID) | ORAL | 0 refills | Status: DC

## 2020-07-27 MED ORDER — ALBUTEROL SULFATE HFA 108 (90 BASE) MCG/ACT IN AERS: 1.0000 | INHALATION_SPRAY | Freq: Four times a day (QID) | RESPIRATORY_TRACT | 0 refills | Status: DC | PRN

## 2020-07-27 MED FILL — GABAPENTIN 300 MG CAPSULE: 300 | 30 days supply | Qty: 60 | Fill #0

## 2020-07-27 MED FILL — ALBUTEROL SULFATE HFA 108 (: 108 (90 BAS | 25 days supply | Qty: 18 | Fill #0

## 2020-07-27 MED FILL — ATROVENT HFA INHALER: 17 | 30 days supply | Qty: 13 | Fill #0

## 2020-07-27 NOTE — Telephone Encounter (Signed)
Medication Refill - Medication: ipratropium (ATROVENT HFA) 17 MCG/ACT inhaler [[id number]]   albuterol (VENTOLIN HFA) 108 (90 Base) MCG/ACT inhaler [[id number]]   gabapentin (NEURONTIN) 300 MG capsule [[id number]  Preferred Pharmacy (with phone number or street name):  [facility] - [place], [place] - [place] [address]  [address] [place] [address]  Phone: [phone] Fax: [phone]  Hours: Not open 24 hours     Agent: Please be advised that RX refills may take up to 3 business days. We ask that you follow-up with your pharmacy.

## 2020-07-27 NOTE — Telephone Encounter (Signed)
Pharmacy is requesting changes to Rx sent today- sent for provider review

## 2020-08-04 ENCOUNTER — Ambulatory Visit: Payer: Self-pay

## 2020-08-11 MED FILL — predniSONE 10 MG TABS: 10 | 10 days supply | Qty: 5 | Fill #3

## 2020-08-11 MED FILL — DULERA 200 MCG/5 MCG INH: 200-5 | 30 days supply | Qty: 13 | Fill #0

## 2020-08-11 MED FILL — PROAIR HFA 90 MCG INHALER: 108 (90 BAS | 25 days supply | Qty: 9 | Fill #0

## 2020-08-26 ENCOUNTER — Other Ambulatory Visit: Payer: Self-pay | Admitting: Critical Care Medicine

## 2020-08-26 ENCOUNTER — Other Ambulatory Visit: Payer: Self-pay | Admitting: Internal Medicine

## 2020-08-26 DIAGNOSIS — G5793 Unspecified mononeuropathy of bilateral lower limbs: Secondary | ICD-10-CM

## 2020-08-26 MED ORDER — ATROVENT HFA 17 MCG/ACT IN AERS: 2.0000 | INHALATION_SPRAY | Freq: Four times a day (QID) | RESPIRATORY_TRACT | 0 refills | Status: DC

## 2020-08-26 MED ORDER — GABAPENTIN 300 MG PO CAPS: 300.0000 mg | ORAL_CAPSULE | Freq: Two times a day (BID) | ORAL | 2 refills | Status: DC

## 2020-08-26 MED FILL — ATROVENT HFA INHALER: 17 | 25 days supply | Qty: 13 | Fill #0

## 2020-08-26 NOTE — Telephone Encounter (Signed)
Medication Refill - Medication: ipratropium (ATROVENT HFA) 17 MCG/ACT inhaler gabapentin (NEURONTIN) 300 MG capsule  Pt is completely out of Atrovent   Has the patient contacted their pharmacy? Yes.   (Agent: If no, request that the patient contact the pharmacy for the refill.) (Agent: If yes, when and what did the pharmacy advise?)  Preferred Pharmacy (with phone number or street name):  [facility] [place], [place] - [place] [address]  [address] [place] [address]  Phone: [phone] Fax: [phone]     Agent: Please be advised that RX refills may take up to 3 business days. We ask that you follow-up with your pharmacy.

## 2020-08-27 ENCOUNTER — Other Ambulatory Visit: Payer: Self-pay

## 2020-08-27 MED FILL — PROGESTERONE 200 MG CAPSULE: 200 | 30 days supply | Qty: 30 | Fill #1

## 2020-08-27 MED FILL — GABAPENTIN 300 MG CAPSULE: 300 | 30 days supply | Qty: 60 | Fill #0

## 2020-08-28 NOTE — Telephone Encounter (Signed)
error 

## 2020-08-31 ENCOUNTER — Ambulatory Visit: Payer: Medicaid Other | Admitting: Internal Medicine

## 2020-09-03 ENCOUNTER — Other Ambulatory Visit: Payer: Self-pay | Admitting: Internal Medicine

## 2020-09-03 DIAGNOSIS — J449 Chronic obstructive pulmonary disease, unspecified: Secondary | ICD-10-CM

## 2020-09-03 NOTE — Telephone Encounter (Signed)
Requested medication (s) are due for refill today:No  Requested medication (s) are on the active medication list: yes  Last refill:  07/27/20  with 2 refills  Future visit scheduled:No  Notes to clinic:  Attempted to contact patient to triage for symptoms Should have refills. Phone number not in service. Request has come from [facility] [address] [place]. Last ordered at  [facility] pharmacy.    Requested Prescriptions  Pending Prescriptions Disp Refills   PROAIR HFA 108 (90 Base) MCG/ACT inhaler [Pharmacy Med Name: PROAIR HFA ORAL INH (200  PFS) 8.5G] 8.5 g 2    Sig: 2 PUFFS BY MOUTH FOUR TIMES DAILY AND AS NEEDED      Pulmonology:  Beta Agonists Failed - 09/03/2020 10:16 AM      Failed - One inhaler should last at least one month. If the patient is requesting refills earlier, contact the patient to check for uncontrolled symptoms.      Passed - Valid encounter within last 12 months    Recent Outpatient Visits           5 months ago COPD GOLD D  with chronic bronchitis (HCC)   [facility] Storm Frisk, MD   9 months ago COPD GOLD D  with chronic bronchitis [facility])   [place] [facility] Storm Frisk, MD   10 months ago COPD GOLD D  with chronic bronchitis [facility])   [place] [facility] [facility] Marcine Matar, MD   11 months ago COPD GOLD D  with chronic bronchitis [facility])   [facility] Storm Frisk, MD   1 year ago Homeless   [place] Marcine Matar, MD

## 2020-09-04 ENCOUNTER — Other Ambulatory Visit: Payer: Self-pay | Admitting: Internal Medicine

## 2020-09-04 NOTE — Telephone Encounter (Signed)
Pharmacy called and is requesting to have the Atrovent inhaler sent in the pharmacy for pt. Please advise.      [id number] - [place], [place] - [address] AT [facility] [address]  [address] [place] [address]  Phone: [phone] Fax: [phone]  Hours: Open 24 hours

## 2020-09-04 NOTE — Telephone Encounter (Signed)
Requested  medications are  due for refill today yes  Requested medications are on the active medication list yes  Notes to clinic This rx was approved at [facility] pharmacy, asking for approval at different pharmacy, I was not sure if it had already been picked up or if the "responded to by different means" meant you already sent to other pharmacy. Please assess.

## 2020-09-08 ENCOUNTER — Other Ambulatory Visit: Payer: Self-pay | Admitting: Internal Medicine

## 2020-09-08 ENCOUNTER — Ambulatory Visit: Payer: Medicaid Other | Admitting: Critical Care Medicine

## 2020-09-08 NOTE — Telephone Encounter (Signed)
Courtesy refill  

## 2020-09-18 ENCOUNTER — Other Ambulatory Visit: Payer: Self-pay | Admitting: Internal Medicine

## 2020-09-18 DIAGNOSIS — G5793 Unspecified mononeuropathy of bilateral lower limbs: Secondary | ICD-10-CM

## 2020-09-18 NOTE — Telephone Encounter (Signed)
Medication Refill - Medication: ipratropium (ATROVENT HFA) 17 MCG/ACT inhaler  gabapentin (NEURONTIN) 300 MG capsule    Has the patient contacted their pharmacy? Yes.   (Agent: If no, request that the patient contact the pharmacy for the refill.) (Agent: If yes, when and what did the pharmacy advise?)  Preferred Pharmacy (with phone number or street name):  [facility] - [place], [place] - [place] [address]  [address] [place] [address]  Phone: [phone] Fax: [phone]      Agent: Please be advised that RX refills may take up to 3 business days. We ask that you follow-up with your pharmacy.

## 2020-09-29 ENCOUNTER — Other Ambulatory Visit: Payer: Self-pay | Admitting: Internal Medicine

## 2020-09-29 DIAGNOSIS — J449 Chronic obstructive pulmonary disease, unspecified: Secondary | ICD-10-CM

## 2020-09-29 NOTE — Telephone Encounter (Signed)
Requested medication (s) are due for refill today: yes  Requested medication (s) are on the active medication list: yes  Last refill: 09/03/2020  Future visit scheduled:no  Notes to clinic: attempts have been made to contact patient  Patient is overdue for follow up and has missed several appointments   Requested Prescriptions  Pending Prescriptions Disp Refills   PROAIR HFA 108 (90 Base) MCG/ACT inhaler [Pharmacy Med Name: PROAIR HFA ORAL INH (200  PFS) 8.5G] 8.5 g 0    Sig: INHALE 2 PUFFS BY MOUTH FOUR TIMES DAILY AS NEEDED      Pulmonology:  Beta Agonists Failed - 09/29/2020  3:30 AM      Failed - One inhaler should last at least one month. If the patient is requesting refills earlier, contact the patient to check for uncontrolled symptoms.      Passed - Valid encounter within last 12 months    Recent Outpatient Visits           6 months ago COPD GOLD D  with chronic bronchitis (HCC)   [place] [facility] Storm Frisk, MD   10 months ago COPD GOLD D  with chronic bronchitis [facility])   [facility] Storm Frisk, MD   11 months ago COPD GOLD D  with chronic bronchitis [facility])   [place] [facility] Marcine Matar, MD   11 months ago COPD GOLD D  with chronic bronchitis [facility])   [place] [facility] Storm Frisk, MD   1 year ago Homeless   [facility] Marcine Matar, MD

## 2020-10-04 ENCOUNTER — Ambulatory Visit (HOSPITAL_COMMUNITY): Admit: 2020-10-04 | Disposition: A | Discharge status: Still In Hospital | Payer: Medicaid Other

## 2020-10-04 ENCOUNTER — Encounter (HOSPITAL_COMMUNITY): Payer: Self-pay

## 2020-10-04 ENCOUNTER — Other Ambulatory Visit: Payer: Self-pay

## 2020-10-04 ENCOUNTER — Ambulatory Visit (HOSPITAL_COMMUNITY)
Admission: EM | Admit: 2020-10-04 | Discharge: 2020-10-04 | Disposition: A | Discharge status: Home / Self Care | Payer: Medicaid Other | Attending: Emergency Medicine | Admitting: Emergency Medicine

## 2020-10-04 DIAGNOSIS — R300 Dysuria: Secondary | ICD-10-CM | POA: Diagnosis present

## 2020-10-04 DIAGNOSIS — Z1152 Encounter for screening for COVID-19: Secondary | ICD-10-CM | POA: Diagnosis present

## 2020-10-04 DIAGNOSIS — N390 Urinary tract infection, site not specified: Secondary | ICD-10-CM | POA: Diagnosis not present

## 2020-10-04 DIAGNOSIS — N3941 Urge incontinence: Secondary | ICD-10-CM | POA: Diagnosis present

## 2020-10-04 DIAGNOSIS — R350 Frequency of micturition: Secondary | ICD-10-CM | POA: Insufficient documentation

## 2020-10-04 LAB — POCT URINALYSIS DIPSTICK, ED / UC
Bilirubin Urine: NEGATIVE
Glucose, UA: NEGATIVE mg/dL
Nitrite: NEGATIVE
Protein, ur: 100 mg/dL — AB
Specific Gravity, Urine: >=1.03 (ref 1.005–1.030)
Urobilinogen, UA: 0.2 mg/dL (ref 0.0–1.0)
pH: 5.5 (ref 5.0–8.0)

## 2020-10-04 LAB — RESP PANEL BY RT PCR (RSV, FLU A&B, COVID)
Influenza A by PCR: NEGATIVE
Influenza B by PCR: NEGATIVE
Respiratory Syncytial Virus by PCR: NEGATIVE
SARS Coronavirus 2 by RT PCR: NEGATIVE

## 2020-10-04 MED ORDER — PHENAZOPYRIDINE HCL 200 MG PO TABS: 200.0000 mg | ORAL_TABLET | Freq: Three times a day (TID) | ORAL | 0 refills | Status: DC

## 2020-10-04 MED ORDER — NITROFURANTOIN MONOHYD MACRO 100 MG PO CAPS: 100.0000 mg | ORAL_CAPSULE | Freq: Two times a day (BID) | ORAL | 0 refills | Status: DC

## 2020-10-04 NOTE — ED Triage Notes (Signed)
Pt present UTI symptoms started on Tuesday. Pt states that she having urgency to go to the bathroom but only urinating small amount of urine with some stingy while going.

## 2020-10-04 NOTE — Discharge Instructions (Signed)
I have sent in Macrobid for you to take twice a day for 5 days  Will culture urine and inform of abnormal results that require further treatment  Prescribed pyridium for urinary discomfort  Follow up with this office or with primary care if symptoms are persisting.  Follow up in the ER for high fever, trouble swallowing, trouble breathing, other concerning symptoms.

## 2020-10-04 NOTE — ED Provider Notes (Signed)
[facility]   CC: UTI  SUBJECTIVE:  Elizabeth Villarreal is a 49 y.o. female who complains of urinary frequency, urgency and dysuria for the past 6 days. Patient denies a precipitating event, recent sexual encounter, excessive caffeine intake. Localizes the pain to the lower abdomen. Pain is intermittent and describes it as sharp. Has not tried OTC medications. Symptoms are made worse with urination. Admits to similar symptoms in the past.  Denies fever, chills, nausea, vomiting, abdominal pain, flank pain, abnormal vaginal discharge or bleeding, hematuria. Requesting flu and Covid screen for pulmonary function testing next week.  LMP: No LMP recorded.  ROS: As in HPI.  All other pertinent ROS negative.     Past Medical History:  Diagnosis Date  . CAP (community acquired pneumonia) 08/27/2019  . COPD (chronic obstructive pulmonary disease) (HCC)   . Depression   . Tobacco abuse    Past Surgical History:  Procedure Laterality Date  . CESAREAN SECTION    . ECTOPIC PREGNANCY SURGERY    . ovarian cyst rupture    . ovary cyst rupture     Allergies  Allergen Reactions  . Darvon [Propoxyphene] Other (See Comments)    Patient was aware her spoken words were not matching her thoughts   No current facility-administered medications on file prior to encounter.   Current Outpatient Medications on File Prior to Encounter  Medication Sig Dispense Refill  . albuterol (PROAIR HFA) 108 (90 Base) MCG/ACT inhaler Inhale 2 puffs into the lungs every 6 (six) hours as needed for wheezing or shortness of breath. Please make PCP appt - last fill! 8.5 g 0  . benzoyl peroxide-erythromycin (BENZAMYCIN) gel Apply topically 2 (two) times daily. To affected areas on face 23.3 g 0  . DULERA 200-5 MCG/ACT AERO INHALE 2 PUFFS BY MOUTH TWICE DAILY 13 g 0  . folic acid (FOLVITE) 1 MG tablet Take 1 tablet (1 mg total) by mouth daily. 90 tablet 1  . gabapentin (NEURONTIN) 300 MG capsule Take 1 capsule (300 mg  total) by mouth 2 (two) times daily. 60 capsule 2  . ipratropium (ATROVENT HFA) 17 MCG/ACT inhaler Inhale 2 puffs into the lungs every 6 (six) hours. 18 g 0  . progesterone (PROMETRIUM) 200 MG capsule Take 1 capsule (200 mg total) by mouth daily. 30 capsule 3  . vitamin B-12 (CYANOCOBALAMIN) 500 MCG tablet Take 1 tablet (500 mcg total) by mouth daily. 90 tablet 1   Social History   Socioeconomic History  . Marital status: Single    Spouse name: Not on file  . Number of children: Not on file  . Years of education: Not on file  . Highest education level: Not on file  Occupational History  . Not on file  Tobacco Use  . Smoking status: Former Smoker    Packs/day: 1.00    Types: Cigarettes    Quit date: 01/15/2018    Years since quitting: 2.7  . Smokeless tobacco: Never Used  Vaping Use  . Vaping Use: Former  Substance and Sexual Activity  . Alcohol use: No  . Drug use: No  . Sexual activity: Not Currently    Birth control/protection: None  Other Topics Concern  . Not on file  Social History Narrative  . Not on file   Social Determinants of Health   Financial Resource Strain:   . Difficulty of Paying Living Expenses: Not on file  Food Insecurity:   . Worried About Programme researcher, broadcasting/film/video in the Last Year:  Not on file  . Ran Out of Food in the Last Year: Not on file  Transportation Needs:   . Lack of Transportation (Medical): Not on file  . Lack of Transportation (Non-Medical): Not on file  Physical Activity:   . Days of Exercise per Week: Not on file  . Minutes of Exercise per Session: Not on file  Stress:   . Feeling of Stress : Not on file  Social Connections:   . Frequency of Communication with Friends and Family: Not on file  . Frequency of Social Gatherings with Friends and Family: Not on file  . Attends Religious Services: Not on file  . Active Member of Clubs or Organizations: Not on file  . Attends Banker Meetings: Not on file  . Marital Status: Not  on file  Intimate Partner Violence:   . Fear of Current or Ex-Partner: Not on file  . Emotionally Abused: Not on file  . Physically Abused: Not on file  . Sexually Abused: Not on file   Family History  Problem Relation Age of Onset  . COPD Mother   . Lung cancer Father     OBJECTIVE:  Vitals:   10/04/20 1630  BP: 118/71  Pulse: (!) 105  Resp: 18  Temp: 98.8 F (37.1 C)  TempSrc: Oral  SpO2: 100%   General appearance: AOx3 in no acute distress HEENT: NCAT. Oropharynx clear.  Lungs: clear to auscultation bilaterally without adventitious breath sounds Heart: regular rate and rhythm. Radial pulses 2+ symmetrical bilaterally Abdomen: soft; non-distended; suprapubic tenderness; bowel sounds present; no guarding or rebound tenderness Back: no CVA tenderness Extremities: no edema; symmetrical with no gross deformities Skin: warm and dry Neurologic: Ambulates from chair to exam table without difficulty Psychological: alert and cooperative; normal mood and affect  Labs Reviewed  POCT URINALYSIS DIPSTICK, ED / UC - Abnormal; Notable for the following components:      Result Value   Ketones, ur TRACE (*)    Hgb urine dipstick LARGE (*)    Protein, ur 100 (*)    Leukocytes,Ua SMALL (*)    All other components within normal limits  RESP PANEL BY RT PCR (RSV, FLU A&B, COVID)    ASSESSMENT & PLAN:  1. Dysuria   2. Urgency incontinence   3. Frequency of urination   4. Encounter for screening for COVID-19     Meds ordered this encounter  Medications  . nitrofurantoin, macrocrystal-monohydrate, (MACROBID) 100 MG capsule    Sig: Take 1 capsule (100 mg total) by mouth 2 (two) times daily.    Dispense:  10 capsule    Refill:  0    Order Specific Question:   Supervising Provider    Answer:   Merrilee Jansky [id number]  . phenazopyridine (PYRIDIUM) 200 MG tablet    Sig: Take 1 tablet (200 mg total) by mouth 3 (three) times daily.    Dispense:  6 tablet    Refill:  0     Order Specific Question:   Supervising Provider    Answer:   Merrilee Jansky [[id number]]   Prescribed Macrobid Prescribed Pyridium Urine culture sent  We will call you with abnormal results that need further treatment Push fluids and get plenty of rest Take antibiotic as directed and to completion Take pyridium as prescribed and as needed for symptomatic relief Your COVID/FLU/RSV test is pending.  You should self quarantine until the test result is back.    Take Tylenol as needed for fever  or discomfort.  Rest and keep yourself hydrated.    Go to the emergency department if you develop acute worsening symptoms.      Outlined signs and symptoms indicating need for more acute intervention Patient verbalized understanding After Visit Summary given     Moshe Cipro, NP 10/06/20 1703

## 2020-10-05 ENCOUNTER — Other Ambulatory Visit: Payer: Self-pay | Admitting: Critical Care Medicine

## 2020-10-05 MED FILL — DULERA 200 MCG/5 MCG INH: 200-5 | 30 days supply | Qty: 13 | Fill #1

## 2020-10-05 MED FILL — GABAPENTIN 300 MG CAPSULE: 300 | 30 days supply | Qty: 60 | Fill #1

## 2020-10-05 MED FILL — ATROVENT HFA INHALER: 17 | 25 days supply | Qty: 13 | Fill #0

## 2020-10-05 MED FILL — predniSONE 10 MG TABS: 10 | 10 days supply | Qty: 5 | Fill #0

## 2020-10-05 NOTE — Telephone Encounter (Signed)
Requested medication (s) are due for refill today: yes   Requested medication (s) are on the active medication list: no  Last refill:  10/05/20     #5     2 refills  Future visit scheduled: no   Notes to clinic:  not delegated per protocol, medication not on med list, ordered by Dr. Delford Field 10/05/20     Requested Prescriptions  Pending Prescriptions Disp Refills   predniSONE (DELTASONE) 10 MG tablet [Pharmacy Med Name: predniSONE 10 MG TABS 10 Tablet] 5 tablet 2    Sig: TAKE 1/2 TABLET (5 MG TOTAL) BY MOUTH DAILY FOR 10 DAYS.      Not Delegated - Endocrinology:  Oral Corticosteroids Failed - 10/05/2020  9:19 AM      Failed - This refill cannot be delegated      Failed - Valid encounter within last 6 months    Recent Outpatient Visits           6 months ago COPD GOLD D  with chronic bronchitis (HCC)   [facility] Storm Frisk, MD   10 months ago COPD GOLD D  with chronic bronchitis [facility])   [place] [facility] Storm Frisk, MD   11 months ago COPD GOLD D  with chronic bronchitis [facility])   [place] [facility] Marcine Matar, MD   12 months ago COPD GOLD D  with chronic bronchitis [facility])   [address] [facility] Storm Frisk, MD   1 year ago Homeless   [facility] Marcine Matar, MD              Passed - Last BP in normal range    BP Readings from Last 1 Encounters:  10/04/20 118/71

## 2020-10-25 ENCOUNTER — Other Ambulatory Visit: Payer: Self-pay | Admitting: Internal Medicine

## 2020-10-25 DIAGNOSIS — J449 Chronic obstructive pulmonary disease, unspecified: Secondary | ICD-10-CM

## 2020-10-27 ENCOUNTER — Other Ambulatory Visit: Payer: Self-pay | Admitting: Internal Medicine

## 2020-10-27 DIAGNOSIS — G5793 Unspecified mononeuropathy of bilateral lower limbs: Secondary | ICD-10-CM

## 2020-10-27 MED ORDER — GABAPENTIN 300 MG PO CAPS: 300.0000 mg | ORAL_CAPSULE | Freq: Two times a day (BID) | ORAL | 0 refills | Status: DC

## 2020-10-27 NOTE — Telephone Encounter (Signed)
Medication Refill - Medication: gabapentin (NEURONTIN) 300 MG capsule /Pt would like refill before the holiday weekend/ please advise   Has the patient contacted their pharmacy? No. (Agent: If no, request that the patient contact the pharmacy for the refill.) (Agent: If yes, when and what did the pharmacy advise?)  Preferred Pharmacy (with phone number or street name): [facility]   Agent: Please be advised that RX refills may take up to 3 business days. We ask that you follow-up with your pharmacy.

## 2020-10-27 NOTE — Telephone Encounter (Signed)
Requested Prescriptions  Pending Prescriptions Disp Refills   gabapentin (NEURONTIN) 300 MG capsule 60 capsule 0    Sig: Take 1 capsule (300 mg total) by mouth 2 (two) times daily.     Neurology: Anticonvulsants - gabapentin Passed - 10/27/2020  3:41 PM      Passed - Valid encounter within last 12 months    Recent Outpatient Visits          6 months ago COPD GOLD D  with chronic bronchitis (HCC)   [place] [facility] Storm Frisk, MD   11 months ago COPD GOLD D  with chronic bronchitis [facility])   [place] [facility] Storm Frisk, MD   1 year ago COPD GOLD D  with chronic bronchitis [facility])   [place] [facility] Marcine Matar, MD   1 year ago COPD GOLD D  with chronic bronchitis [facility])   [place] [facility] Storm Frisk, MD   1 year ago Homeless   [facility] Marcine Matar, MD      Future Appointments            In 1 week Delford Field Charlcie Cradle, MD [facility]           pt. Contacted to schedule f/u appt.  Stated she was trying to schedule a hospital f/u appt.; reported was hospitalized 11/8-11/12 for pneumonia.  Reported she was negative for COVID.  Appt. Sched. 11/04/20.  Agreed with plan.

## 2020-11-02 MED FILL — GABAPENTIN 300 MG CAPSULE: 300 | 30 days supply | Qty: 60 | Fill #2

## 2020-11-04 ENCOUNTER — Other Ambulatory Visit: Payer: Self-pay | Admitting: Critical Care Medicine

## 2020-11-04 ENCOUNTER — Encounter: Payer: Self-pay | Admitting: Critical Care Medicine

## 2020-11-04 ENCOUNTER — Telehealth: Payer: Self-pay

## 2020-11-04 ENCOUNTER — Ambulatory Visit: Payer: Medicaid Other | Attending: Critical Care Medicine | Admitting: Critical Care Medicine

## 2020-11-04 ENCOUNTER — Other Ambulatory Visit: Payer: Self-pay

## 2020-11-04 VITALS — BP 113/74 | HR 105 | Wt 118.0 lb

## 2020-11-04 DIAGNOSIS — J449 Chronic obstructive pulmonary disease, unspecified: Secondary | ICD-10-CM | POA: Diagnosis not present

## 2020-11-04 DIAGNOSIS — J189 Pneumonia, unspecified organism: Secondary | ICD-10-CM

## 2020-11-04 DIAGNOSIS — Z23 Encounter for immunization: Secondary | ICD-10-CM | POA: Diagnosis not present

## 2020-11-04 DIAGNOSIS — G5793 Unspecified mononeuropathy of bilateral lower limbs: Secondary | ICD-10-CM | POA: Diagnosis not present

## 2020-11-04 DIAGNOSIS — M25562 Pain in left knee: Secondary | ICD-10-CM

## 2020-11-04 DIAGNOSIS — Z720 Tobacco use: Secondary | ICD-10-CM

## 2020-11-04 DIAGNOSIS — M25561 Pain in right knee: Secondary | ICD-10-CM

## 2020-11-04 MED ORDER — ALBUTEROL SULFATE HFA 108 (90 BASE) MCG/ACT IN AERS: 2.0000 | INHALATION_SPRAY | Freq: Four times a day (QID) | RESPIRATORY_TRACT | 3 refills | Status: DC | PRN

## 2020-11-04 MED ORDER — PREDNISONE 10 MG PO TABS
10.0000 mg | ORAL_TABLET | Freq: Every day | ORAL | 2 refills | Status: DC
Start: 2020-11-04 — End: 2020-11-04

## 2020-11-04 MED ORDER — DULERA 200-5 MCG/ACT IN AERO: 2.0000 | INHALATION_SPRAY | Freq: Two times a day (BID) | RESPIRATORY_TRACT | 6 refills | Status: DC

## 2020-11-04 MED ORDER — NICOTINE 21 MG/24HR TD PT24: 21.0000 mg | MEDICATED_PATCH | Freq: Every day | TRANSDERMAL | 0 refills | Status: DC

## 2020-11-04 MED ORDER — IPRATROPIUM-ALBUTEROL 0.5-2.5 (3) MG/3ML IN SOLN: 3.0000 mL | Freq: Four times a day (QID) | RESPIRATORY_TRACT | 6 refills | Status: DC | PRN

## 2020-11-04 MED ORDER — GABAPENTIN 300 MG PO CAPS: 300.0000 mg | ORAL_CAPSULE | Freq: Two times a day (BID) | ORAL | 3 refills | Status: DC

## 2020-11-04 MED FILL — DULERA 200 MCG/5 MCG INH: 200-5 | 30 days supply | Qty: 13 | Fill #0

## 2020-11-04 MED FILL — predniSONE 10 MG TABS: 10 | 30 days supply | Qty: 30 | Fill #0

## 2020-11-04 MED FILL — PROAIR HFA 90 MCG INHALER: 108 (90 BAS | 25 days supply | Qty: 9 | Fill #0

## 2020-11-04 MED FILL — IPRAT-ALBUT 0.5-3(2.5) MG/3: 0.5-2.5 (3) | 15 days supply | Qty: 360 | Fill #0

## 2020-11-04 MED FILL — NICOTINE 21 MG/24HR PATCH: 21 | 28 days supply | Qty: 28 | Fill #0

## 2020-11-04 NOTE — Patient Instructions (Signed)
A referral to Jenel Lucks our clinical social worker will be made for your anxiety and depression  Stop smoking and use nicotine patch daily for this  Change prednisone to 10 mg daily for now  Continue Dulera twice daily and begin DuoNeb at least twice daily by nebulizer medication for this and a new machine was issued  Discontinue Atrovent inhaler  Chest x-ray needs to be obtained today go to [facility] for this you do not need an appointment  Flu vaccine was given  Refills on gabapentin given please make an appointment with podiatry to follow-up on your neuroma  Please obtain a Covid booster shot below is information where you can go but I recommend going to the [facility] on [address] for this COVID-19 Vaccine Information can be found at: [email or web address] For questions related to vaccine distribution or appointments, please email [email or web address] or call [phone].    Return to see Dr. Delford Field 6 weeks   Tobacco Use Disorder Tobacco use disorder (TUD) occurs when a person craves, seeks, and uses tobacco, regardless of the consequences. This disorder can cause problems with mental and physical health. It can affect your ability to have healthy relationships, and it can keep you from meeting your responsibilities at work, home, or school. Tobacco may be:  Smoked as a cigarette or cigar.  Inhaled using e-cigarettes.  Smoked in a pipe or hookah.  Chewed as smokeless tobacco.  Inhaled into the nostrils as snuff. Tobacco products contain a dangerous chemical called nicotine, which is very addictive. Nicotine triggers hormones that make the body feel stimulated and works on areas of the brain that make you feel good. These effects can make it hard for people to quit nicotine. Tobacco contains many other unsafe chemicals that can damage almost every organ in the body. Smoking tobacco also puts others in  danger due to fire risk and possible health problems caused by breathing in secondhand smoke. What are the signs or symptoms? Symptoms of TUD may include:  Being unable to slow down or stop your tobacco use.  Spending an abnormal amount of time getting or using tobacco.  Craving tobacco. Cravings may last for up to 6 months after quitting.  Tobacco use that: ? Interferes with your work, school, or home life. ? Interferes with your personal and social relationships. ? Makes you give up activities that you once enjoyed or found important.  Using tobacco even though you know that it is: ? Dangerous or bad for your health or someone else's health. ? Causing problems in your life.  Needing more and more of the substance to get the same effect (developing tolerance).  Experiencing unpleasant symptoms if you do not use the substance (withdrawal). Withdrawal symptoms may include: ? Depressed, anxious, or irritable mood. ? Difficulty concentrating. ? Increased appetite. ? Restlessness or trouble sleeping.  Using the substance to avoid withdrawal. How is this diagnosed? This condition may be diagnosed based on:  Your current and past tobacco use. Your health care provider may ask questions about how your tobacco use affects your life.  A physical exam. You may be diagnosed with TUD if you have at least two symptoms within a 10-month period. How is this treated? This condition is treated by stopping tobacco use. Many people are unable to quit on their own and need help. Treatment may include:  Nicotine replacement therapy (NRT). NRT provides nicotine without the other harmful chemicals in tobacco. NRT gradually lowers the dosage of nicotine in the body  and reduces withdrawal symptoms. NRT is available as: ? Over-the-counter gums, lozenges, and skin patches. ? Prescription mouth inhalers and nasal sprays.  Medicine that acts on the brain to reduce cravings and withdrawal symptoms.  A  type of talk therapy that examines your triggers for tobacco use, how to avoid them, and how to cope with cravings (behavioral therapy).  Hypnosis. This may help with withdrawal symptoms.  Joining a support group for others coping with TUD. The best treatment for TUD is usually a combination of medicine, talk therapy, and support groups. Recovery can be a long process. Many people start using tobacco again after stopping (relapse). If you relapse, it does not mean that treatment will not work. Follow these instructions at home:  Lifestyle  Do not use any products that contain nicotine or tobacco, such as cigarettes and e-cigarettes.  Avoid things that trigger tobacco use as much as you can. Triggers include people and situations that usually cause you to use tobacco.  Avoid drinks that contain caffeine, including coffee. These may worsen some withdrawal symptoms.  Find ways to manage stress. Wanting to smoke may cause stress, and stress can make you want to smoke. Relaxation techniques such as deep breathing, meditation, and yoga may help.  Attend support groups as needed. These groups are an important part of long-term recovery for many people. General instructions  Take over-the-counter and prescription medicines only as told by your health care provider.  Check with your health care provider before taking any new prescription or over-the-counter medicines.  Decide on a friend, family member, or smoking quit-line (such as 1-800-QUIT-NOW in the [place] that you can call or text when you feel the urge to smoke or when you need help coping with cravings.  Keep all follow-up visits as told by your health care provider and therapist. This is important. Contact a health care provider if:  You are not able to take your medicines as prescribed.  Your symptoms get worse, even with treatment. Summary  Tobacco use disorder (TUD) occurs when a person craves, seeks, and uses tobacco regardless  of the consequences.  This condition may be diagnosed based on your current and past tobacco use and a physical exam.  Many people are unable to quit on their own and need help. Recovery can be a long process.  The most effective treatment for TUD is usually a combination of medicine, talk therapy, and support groups. This information is not intended to replace advice given to you by your health care provider. Make sure you discuss any questions you have with your health care provider. Document Revised: 11/08/2017 Document Reviewed: 11/08/2017 Elsevier Patient Education  2020 [employer].

## 2020-11-04 NOTE — Assessment & Plan Note (Signed)
  .   Current smoking consumption amount: Half a pack a day  . Dicsussion on advise to quit smoking and smoking impacts: Lung health impacts  . Patient's willingness to quit: Wants to quit  . Methods to quit smoking discussed: Behavioral modification  . Medication management of smoking session drugs discussed: Nicotine replacement with patch  . Resources provided:  AVS   . Setting quit date not established  . Follow-up arranged 1 month   Time spent counseling the patient: 5 minutes

## 2020-11-04 NOTE — Progress Notes (Signed)
Subjective:    Patient ID: Elizabeth Villarreal, female    DOB: [birth year], 49 y.o.   MRN: [id number]   11/20/19 This is a 49 year old female actually seen previously at community health and wellness clinic in September 2019.  Patient has a history of COPD but actually more recently was admitted for 9 days at [facility] for right upper lobe pneumonia and sepsis.  The patient actually left the [place] area and went to live in [place] with a relative but when they lost housing there she returned to the area during the midst of the Covid crisis and was in a quarantine at the remodel and and then from there came to the homeless shelter where she has now been for several weeks.  The patient's issue today is that she is run out of her medications and needs follow-up.  She also needs follow-up on the fact that she has had pneumonia.  Below is a copy of the discharge summary She was admitted September 22nd through October 1 History of present illness: -49 y.o.femalewithhistory of COPD previous tobacco abuse recently placed on gabapentin for neuropathy which patient has not taken presents to the ER with complaint of fever chills. Patient states her symptoms started this morning and has not had any productive cough until patient reached ER. Has not had any recent travel but has been living in the shelter for last 1 month  Hospital Course:  1. Sepsis/community-acquired pneumonia -Secondary to community-acquired pneumonia, clinically improved significantly with IV ceftriaxone and azithromycin, sputum cultures were negative -COVID-19 PCR is negative -Given upper lobe infiltrates and as she lives in a shelter,a QuantiFERON-TB gold was sent out on admission, this was noted to be in the indeterminant, case was discussed by my partner with infectious disease, subsequently had AFB smears sent which were negative -Hence no further work-up was not felt to be indicated  2.COPD exacerbation,  pneumonia  -Clinically improved as above, transition to prednisone taper and oral antibiotics to complete course -Recommend follow-up chest x-ray in 4 to 6 weeks  3.Peripheral neuropathy, continue gabapentin  4.Tobacco abuse -Quit smoking 2 years ago, counseled   Currently her shortness of breath is improved.  She is not able to use the nebulizer and homeless shelter because of aerosolization is not asking for an HFA substitute.  She is off prednisone.  She states prednisone has been helpful to her.   On exam blood pressure is 118/72 temps 97 saturation 96% room air pulse 80 chest show distant breath sounds without wheeze rale or rhonchi cardiac exam showed a regular rate rhythm without S3-S4 normal S1-S2 abdomen soft nontender extremities show no edema  Impression is that of COPD asthmatic bronchitic component  I will refill the patient's prednisone at a dose of 5 mg daily we will also refill her Dulera 2 but increase the strength to 200 mcg at 2 puffs twice daily as well she will receive an Atrovent inhaler at 2 puffs 4 times daily in place of her ipratropium minutes in the nebulizer   We will also establish her back into the health and wellness clinic and obtain a chest x-ray as well to follow-up the right upper lobe pneumonia to ensure it has cleared   10/07/2019 The patient returns to reestablish in the clinic after having moved away to [place] for a 16-month interval having returned to this area 2 months ago.  The patient currently is homeless and staying at the [facility].  She recently was admitted  as noted above for COPD exacerbation.  We then saw her back in the homeless shelter clinic and restarted the [facility] and Atrovent inhalers and the patient finished her course of prednisone and now is maintaining 5 mg daily of prednisone as a maintenance.  The patient states her breathing is improved.  She is not smoked in 2 years.  She had a repeat chest x-ray  which showed improvement in the right upper lobe pneumonia.  There is still some small cavitary change in the right upper lobe and left lower lobe nodule seen.  The patient needs a repeat CT scan of the chest for the same.  Currently there is no cough there is some chest tightness and soreness behind the shoulder blades.  She is still working on housing.  Note during the hospitalization the patient did receive a flu vaccine    04/01/2020 Since the last visit in November the patient had developed increased calf pain and claudication in both lower extremities.  A follow-up ABI was performed and was normal for circulatory issues.  The patient does underlying COPD and was also concerned about diabetes.  She comes into the office today with a blood glucose of 92 and hemoglobin A1c of 5.4 She denies any rash.  She feels that she is having more fatigue at this time.  She is working on obtaining a job and has applied with partners for ending homelessness for assistance in obtaining housing Note CT of the chest in November showed resolution of pneumonia and no active nodule seen  11/04/20 Patient is seen today in return follow-up not having been seen in this clinic since April.  The patient was admitted between the eighth and 11 November for COPD exacerbation and bilateral pneumonitis.  She was treated with broad-spectrum antibiotics steroids and neb treatments.  She was at the [facility].  Below is a copy of the discharge summary  Admit date: 10/12/2020 Discharge date and time: 10/15/2020 Hospital LOS: 4 days  Active Hospital Problems  Diagnosis Date Noted POA  . *COPD with acute exacerbation (*) 10/12/2020 Yes  . Bilateral pneumonia 10/13/2020 Yes   Resolved Hospital Problems  No resolved problems to display.   Current Discharge Medication List   DISCONTINUED medications   ALBUTEROL IN    NEW medications  Details  benzonatate (TESSALON) 100 mg capsule  Take one capsule (100 mg dose) by mouth 3 (three) times a day as needed for Cough for up to 7 days. Start date: 10/15/2020, End date: 10/22/2020   doxycycline monohydrate (MONODOX) 100 mg capsule Take one capsule (100 mg dose) by mouth every 12 (twelve) hours for 6 days. Start date: 10/15/2020, End date: 10/21/2020    CHANGED medications  Details  albuterol (ACCUNEB) 0.63 mg/3 mL nebulizer solution Take 3 mLs (0.63 mg dose) by nebulization every 6 (six) hours as needed for Wheezing. Start date: 10/15/2020   DULERA 200-5 MCG/ACT AERO Inhale two puffs into the lungs 2 (two) times daily. Start date: 10/15/2020   ipratropium (ATROVENT) 0.02% nebulizer solution Take 2.5 mLs (0.5 mg dose) by nebulization 4 (four) times daily. Start date: 10/15/2020   ipratropium bromide (ATROVENT HFA) 17 mcg/act inhaler Inhale two puffs into the lungs every 6 (six) hours. Start date: 10/15/2020   predniSONE (DELTASONE) 10 mg tablet Take four tablets daily. Reduce by one tablet every other day until taking 5mg  daily. Start date: 10/15/2020   PROAIR HFA 108 (90 Base) MCG/ACT inhaler Inhale two puffs into the lungs  every 6 (six) hours as needed for Wheezing or Shortness of Breath. Start date: 10/15/2020    CONTINUED medications  Details  gabapentin (NEURONTIN) 300 mg capsule Take 300 mg by mouth 2 (two) times a day as needed.   cyanocobalamin 500 MCG tablet Take 1 tablet by mouth daily.   folic acid 1 mg tablet Take 1 mg by mouth daily.  Hospital Course   Indication for Admission/chief complaint: COPD exacerbation  Hospital Course:  Patient is 49 year old female with a clinical history of severe emphysema and progressive pulmonary cachexia presented [facility] hospital with a 1-2-week history of progressive productive cough of greenish-yellow sputum associated with increased shortness of breath. Upon admission to [facility], chest x-ray showed significant disproportionate  hyperinflation of the lungs and bilateral interstitial infiltrates.    Now here for post hosp OV Patient states her breathing is better.  She has less mucus production.  She does have some wheezing and left-sided chest pain.  She was tested for Covid and was negative.  She is gone back to smoking cigarettes.  Shortness of Breath This is a chronic problem. The current episode started more than 1 year ago. The problem occurs daily. The problem has been gradually improving. Associated symptoms include chest pain, sputum production and wheezing. Pertinent negatives include no coryza, fever, headaches, hemoptysis, leg pain, leg swelling, neck pain, orthopnea, PND, rash, rhinorrhea, sore throat, swollen glands, syncope or vomiting. The symptoms are aggravated by exercise and animal exposure. Risk factors include smoking. She has tried steroid inhalers, ipratropium inhalers, oral steroids and beta agonist inhalers for the symptoms. The treatment provided moderate relief. Her past medical history is significant for chronic lung disease, COPD and pneumonia. There is no history of allergies, aspirin allergies, asthma, bronchiolitis, CAD, DVT, a heart failure, PE or a recent surgery.    Past Medical History:  Diagnosis Date  . CAP (community acquired pneumonia) 08/27/2019  . COPD (chronic obstructive pulmonary disease) (HCC)   . Depression   . Tobacco abuse      Family History  Problem Relation Age of Onset  . COPD Mother   . Lung cancer Father      Social History   Socioeconomic History  . Marital status: Single    Spouse name: Not on file  . Number of children: Not on file  . Years of education: Not on file  . Highest education level: Not on file  Occupational History  . Not on file  Tobacco Use  . Smoking status: Current Every Day Smoker    Packs/day: 1.00    Types: Cigarettes    Last attempt to quit: 01/15/2018    Years since quitting: 2.8  . Smokeless tobacco: Never Used  Vaping Use   . Vaping Use: Former  Substance and Sexual Activity  . Alcohol use: Yes    Alcohol/week: 1.0 standard drink    Types: 1 Cans of beer per week  . Drug use: No  . Sexual activity: Not Currently    Birth control/protection: None  Other Topics Concern  . Not on file  Social History Narrative  . Not on file   Social Determinants of Health   Financial Resource Strain:   . Difficulty of Paying Living Expenses: Not on file  Food Insecurity:   . Worried About Programme researcher, broadcasting/film/video in the Last Year: Not on file  . Ran Out of Food in the Last Year: Not on file  Transportation Needs:   . Lack of  Transportation (Medical): Not on file  . Lack of Transportation (Non-Medical): Not on file  Physical Activity:   . Days of Exercise per Week: Not on file  . Minutes of Exercise per Session: Not on file  Stress:   . Feeling of Stress : Not on file  Social Connections:   . Frequency of Communication with Friends and Family: Not on file  . Frequency of Social Gatherings with Friends and Family: Not on file  . Attends Religious Services: Not on file  . Active Member of Clubs or Organizations: Not on file  . Attends Banker Meetings: Not on file  . Marital Status: Not on file  Intimate Partner Violence:   . Fear of Current or Ex-Partner: Not on file  . Emotionally Abused: Not on file  . Physically Abused: Not on file  . Sexually Abused: Not on file     Allergies  Allergen Reactions  . Darvon [Propoxyphene] Other (See Comments)    Patient was aware her spoken words were not matching her thoughts     Outpatient Medications Prior to Visit  Medication Sig Dispense Refill  . albuterol (PROAIR HFA) 108 (90 Base) MCG/ACT inhaler Inhale 2 puffs into the lungs every 6 (six) hours as needed for wheezing or shortness of breath. Please make PCP appt - last fill! 8.5 g 0  . DULERA 200-5 MCG/ACT AERO INHALE 2 PUFFS BY MOUTH TWICE DAILY 13 g 0  . gabapentin (NEURONTIN) 300 MG capsule Take 1  capsule (300 mg total) by mouth 2 (two) times daily. 60 capsule 0  . ipratropium (ATROVENT HFA) 17 MCG/ACT inhaler Inhale 2 puffs into the lungs every 6 (six) hours. 18 g 0  . predniSONE (DELTASONE) 10 MG tablet TAKE 1/2 TABLET (5 MG TOTAL) BY MOUTH DAILY FOR 10 DAYS. 5 tablet 2  . benzoyl peroxide-erythromycin (BENZAMYCIN) gel Apply topically 2 (two) times daily. To affected areas on face (Patient not taking: Reported on 11/04/2020) 23.3 g 0  . folic acid (FOLVITE) 1 MG tablet Take 1 tablet (1 mg total) by mouth daily. (Patient not taking: Reported on 11/04/2020) 90 tablet 1  . nitrofurantoin, macrocrystal-monohydrate, (MACROBID) 100 MG capsule Take 1 capsule (100 mg total) by mouth 2 (two) times daily. (Patient not taking: Reported on 11/04/2020) 10 capsule 0  . phenazopyridine (PYRIDIUM) 200 MG tablet Take 1 tablet (200 mg total) by mouth 3 (three) times daily. (Patient not taking: Reported on 11/04/2020) 6 tablet 0  . progesterone (PROMETRIUM) 200 MG capsule Take 1 capsule (200 mg total) by mouth daily. (Patient not taking: Reported on 11/04/2020) 30 capsule 3  . vitamin B-12 (CYANOCOBALAMIN) 500 MCG tablet Take 1 tablet (500 mcg total) by mouth daily. (Patient not taking: Reported on 11/04/2020) 90 tablet 1   No facility-administered medications prior to visit.    Review of Systems  Constitutional: Negative for fever.  HENT: Negative for rhinorrhea and sore throat.   Respiratory: Positive for sputum production, shortness of breath and wheezing. Negative for hemoptysis.   Cardiovascular: Positive for chest pain. Negative for orthopnea, leg swelling, syncope and PND.  Gastrointestinal: Negative for vomiting.  Musculoskeletal: Negative for neck pain.  Skin: Negative for rash.  Neurological: Negative for headaches.       Objective:   Physical Exam Vitals:   11/04/20 0911  BP: 113/74  Pulse: (!) 105  SpO2: 98%  Weight: 118 lb (53.5 kg)    Gen: Pleasant, well-nourished, in no distress,   normal affect  ENT: No  lesions,  mouth clear,  oropharynx clear, no postnasal drip  Neck: No JVD, no TMG, no carotid bruits  Lungs: No use of accessory muscles, no dullness to percussion, hyperresonance to percussion and decreased breath sounds  Cardiovascular: RRR, heart sounds normal, no murmur or gallops, no peripheral edema,   Abdomen: soft and NT, no HSM,  BS normal  Musculoskeletal: No deformities, no cyanosis or clubbing No deformity in the feet are observed Neuro: alert, non focal  Skin: Warm, no lesions or rashes   CXR 10/30: IMPRESSION: 1. Near complete resolution of right upper lobe pneumonia. Small somewhat irregular opacity remains in the right upper lobe, potentially with central cavitation. One additional short interval radiographic follow-up could be performed to assess for resolution, alternatively chest CT could be obtained given underlying emphysema. 2. Small focal opacity at the lingula, atelectasis versus minimal infiltrate 3. Hyperinflated lungs with emphysema  Lab Results  Component Value Date   HGBA1C 5.5 04/01/2020        Assessment & Plan:  I personally reviewed all images and lab data in the [facility] system as well as any outside material available during this office visit and agree with the  radiology impressions.   COPD GOLD D  with chronic bronchitis (HCC) Gold stage D COPD with recurrent exacerbation  Continue prednisone 10 mg daily Replace nebulizer machine begin DuoNeb twice daily Continue Dulera twice daily Discontinue Atrovent inhaler  Tobacco abuse    . Current smoking consumption amount: Half a pack a day  . Dicsussion on advise to quit smoking and smoking impacts: Lung health impacts  . Patient's willingness to quit: Wants to quit  . Methods to quit smoking discussed: Behavioral modification  . Medication management of smoking session drugs discussed: Nicotine replacement with patch  . Resources provided:  AVS    . Setting quit date not established  . Follow-up arranged 1 month   Time spent counseling the patient: 5 minutes   Acute pain of both knees Acute pain in both knees  Obtain knee x-ray   Elizabeth Villarreal was seen today for hospitalization follow-up.  Diagnoses and all orders for this visit:  Community acquired pneumonia, unspecified laterality -     DG Chest 2 View; Future  Neuropathic pain of both feet -     gabapentin (NEURONTIN) 300 MG capsule; Take 1 capsule (300 mg total) by mouth 2 (two) times daily.  COPD with chronic bronchitis (HCC) -     albuterol (PROAIR HFA) 108 (90 Base) MCG/ACT inhaler; Inhale 2 puffs into the lungs every 6 (six) hours as needed for wheezing or shortness of breath. Please make PCP appt - last fill! -     For home use only DME Nebulizer machine -     DG Chest 2 View; Future  Need for immunization against influenza -     Flu Vaccine QUAD 36+ mos IM  Tobacco abuse  Acute pain of both knees  Other orders -     mometasone-formoterol (DULERA) 200-5 MCG/ACT AERO; Inhale 2 puffs into the lungs 2 (two) times daily. -     predniSONE (DELTASONE) 10 MG tablet; Take 1 tablet (10 mg total) by mouth daily with breakfast. -     ipratropium-albuterol (DUONEB) 0.5-2.5 (3) MG/3ML SOLN; Take 3 mLs by nebulization every 6 (six) hours as needed. -     nicotine (NICODERM CQ - DOSED IN MG/24 HOURS) 21 mg/24hr patch; Place 1 patch (21 mg total) onto the skin daily.   Flu vaccine was  given

## 2020-11-04 NOTE — Assessment & Plan Note (Signed)
Acute pain in both knees  Obtain knee x-ray

## 2020-11-04 NOTE — Telephone Encounter (Signed)
Att to contact pt to advise that she can pick up nebulizer here at the office, no ans unable to lvm mailbox full

## 2020-11-04 NOTE — Assessment & Plan Note (Signed)
Gold stage D COPD with recurrent exacerbation  Continue prednisone 10 mg daily Replace nebulizer machine begin DuoNeb twice daily Continue Dulera twice daily Discontinue Atrovent inhaler

## 2020-11-04 NOTE — Progress Notes (Signed)
Still having pain in chest  Experiencing SOB Needs progesterone refill Req flu shot

## 2020-11-12 MED FILL — predniSONE 10 MG TABS: 10 | 30 days supply | Qty: 30 | Fill #0

## 2020-11-12 MED FILL — IPRAT-ALBUT 0.5-3(2.5) MG/3: 0.5-2.5 (3) | 15 days supply | Qty: 360 | Fill #0

## 2020-11-12 MED FILL — NICOTINE 21 MG/24HR PATCH: 21 | 28 days supply | Qty: 28 | Fill #0

## 2020-11-12 MED FILL — PROAIR HFA 90 MCG INHALER: 108 (90 BAS | 25 days supply | Qty: 9 | Fill #0

## 2020-11-17 ENCOUNTER — Ambulatory Visit
Admission: RE | Admit: 2020-11-17 | Discharge: 2020-11-17 | Disposition: A | Discharge status: Home / Self Care | Payer: Medicaid Other | Source: Ambulatory Visit | Attending: Critical Care Medicine | Admitting: Critical Care Medicine

## 2020-11-17 DIAGNOSIS — J189 Pneumonia, unspecified organism: Secondary | ICD-10-CM

## 2020-11-17 DIAGNOSIS — J449 Chronic obstructive pulmonary disease, unspecified: Secondary | ICD-10-CM

## 2020-12-16 ENCOUNTER — Inpatient Hospital Stay (HOSPITAL_COMMUNITY): Payer: Self-pay | Admitting: Internal Medicine

## 2020-12-22 ENCOUNTER — Telehealth: Payer: Self-pay | Admitting: Critical Care Medicine

## 2020-12-22 NOTE — Telephone Encounter (Signed)
I called and left a VM on his cell for him to call me back on my cell

## 2020-12-22 NOTE — Telephone Encounter (Signed)
Copied from CRM [id number]. Topic: General - Call Back - No Documentation >> Dec 21, 2020  5:34 PM Randol Kern wrote: Best contact: [phone] Dr. Adron Bene calling from [facility], nothing urgent. He is just requesting a call back from Dr. Delford Field for more information, he states that he missed his call.

## 2020-12-30 MED FILL — PROAIR HFA 90 MCG INHALER: 108 (90 BAS | 25 days supply | Qty: 9 | Fill #1

## 2020-12-30 MED FILL — predniSONE 10 MG TABS: 10 | 30 days supply | Qty: 30 | Fill #1

## 2020-12-30 MED FILL — GABAPENTIN 300 MG CAPSULE: 300 | 30 days supply | Qty: 60 | Fill #0

## 2020-12-30 MED FILL — DULERA 200-5 MCG/ACT AERO: 200-5 | 30 days supply | Qty: 13 | Fill #0

## 2021-01-21 MED FILL — IPRAT-ALBUT 0.5-3(2.5) MG/3: 0.5-2.5 (3) | 30 days supply | Qty: 360 | Fill #1

## 2021-01-25 MED FILL — DULERA 200-5 MCG/ACT AERO: 200-5 | 30 days supply | Qty: 13 | Fill #1

## 2021-01-25 MED FILL — GABAPENTIN 300 MG CAPSULE: 300 | 30 days supply | Qty: 60 | Fill #1

## 2021-01-25 MED FILL — PROAIR HFA 90 MCG INHALER: 108 (90 BAS | 25 days supply | Qty: 9 | Fill #2

## 2021-01-25 MED FILL — predniSONE 10 MG TABS: 10 | 30 days supply | Qty: 30 | Fill #2

## 2021-02-14 NOTE — Progress Notes (Deleted)
Subjective:    Patient ID: Elizabeth Villarreal, female    DOB: [birth year], 50 y.o.   MRN: [id number]   11/20/19 This is a 50 year old female actually seen previously at community health and wellness clinic in September 2019.  Patient has a history of COPD but actually more recently was admitted for 9 days at [facility] for right upper lobe pneumonia and sepsis.  The patient actually left the [place] area and went to live in [place] with a relative but when they lost housing there she returned to the area during the midst of the Covid crisis and was in a quarantine at the remodel and and then from there came to the homeless shelter where she has now been for several weeks.  The patient's issue today is that she is run out of her medications and needs follow-up.  She also needs follow-up on the fact that she has had pneumonia.  Below is a copy of the discharge summary She was admitted September 22nd through October 1 History of present illness: -50 y.o.femalewithhistory of COPD previous tobacco abuse recently placed on gabapentin for neuropathy which patient has not taken presents to the ER with complaint of fever chills. Patient states her symptoms started this morning and has not had any productive cough until patient reached ER. Has not had any recent travel but has been living in the shelter for last 1 month  Hospital Course:  1. Sepsis/community-acquired pneumonia -Secondary to community-acquired pneumonia, clinically improved significantly with IV ceftriaxone and azithromycin, sputum cultures were negative -COVID-19 PCR is negative -Given upper lobe infiltrates and as she lives in a shelter,a QuantiFERON-TB gold was sent out on admission, this was noted to be in the indeterminant, case was discussed by my partner with infectious disease, subsequently had AFB smears sent which were negative -Hence no further work-up was not felt to be indicated  2.COPD exacerbation,  pneumonia  -Clinically improved as above, transition to prednisone taper and oral antibiotics to complete course -Recommend follow-up chest x-ray in 4 to 6 weeks  3.Peripheral neuropathy, continue gabapentin  4.Tobacco abuse -Quit smoking 2 years ago, counseled   Currently her shortness of breath is improved.  She is not able to use the nebulizer and homeless shelter because of aerosolization is not asking for an HFA substitute.  She is off prednisone.  She states prednisone has been helpful to her.   On exam blood pressure is 118/72 temps 97 saturation 96% room air pulse 80 chest show distant breath sounds without wheeze rale or rhonchi cardiac exam showed a regular rate rhythm without S3-S4 normal S1-S2 abdomen soft nontender extremities show no edema  Impression is that of COPD asthmatic bronchitic component  I will refill the patient's prednisone at a dose of 5 mg daily we will also refill her Dulera 2 but increase the strength to 200 mcg at 2 puffs twice daily as well she will receive an Atrovent inhaler at 2 puffs 4 times daily in place of her ipratropium minutes in the nebulizer   We will also establish her back into the health and wellness clinic and obtain a chest x-ray as well to follow-up the right upper lobe pneumonia to ensure it has cleared   10/07/2019 The patient returns to reestablish in the clinic after having moved away to [place] for a 16-month interval having returned to this area 2 months ago.  The patient currently is homeless and staying at the [facility].  She recently was admitted  as noted above for COPD exacerbation.  We then saw her back in the homeless shelter clinic and restarted the [facility] and Atrovent inhalers and the patient finished her course of prednisone and now is maintaining 5 mg daily of prednisone as a maintenance.  The patient states her breathing is improved.  She is not smoked in 2 years.  She had a repeat chest x-ray  which showed improvement in the right upper lobe pneumonia.  There is still some small cavitary change in the right upper lobe and left lower lobe nodule seen.  The patient needs a repeat CT scan of the chest for the same.  Currently there is no cough there is some chest tightness and soreness behind the shoulder blades.  She is still working on housing.  Note during the hospitalization the patient did receive a flu vaccine    04/01/2020 Since the last visit in November the patient had developed increased calf pain and claudication in both lower extremities.  A follow-up ABI was performed and was normal for circulatory issues.  The patient does underlying COPD and was also concerned about diabetes.  She comes into the office today with a blood glucose of 92 and hemoglobin A1c of 5.4 She denies any rash.  She feels that she is having more fatigue at this time.  She is working on obtaining a job and has applied with partners for ending homelessness for assistance in obtaining housing Note CT of the chest in November showed resolution of pneumonia and no active nodule seen  11/04/20 Patient is seen today in return follow-up not having been seen in this clinic since April.  The patient was admitted between the eighth and 11 November for COPD exacerbation and bilateral pneumonitis.  She was treated with broad-spectrum antibiotics steroids and neb treatments.  She was at the [facility].  Below is a copy of the discharge summary  Admit date: 10/12/2020 Discharge date and time: 10/15/2020 Hospital LOS: 4 days  Active Hospital Problems  Diagnosis Date Noted POA  . *COPD with acute exacerbation (*) 10/12/2020 Yes  . Bilateral pneumonia 10/13/2020 Yes   Resolved Hospital Problems  No resolved problems to display.   Current Discharge Medication List   DISCONTINUED medications   ALBUTEROL IN    NEW medications  Details  benzonatate (TESSALON) 100 mg capsule  Take one capsule (100 mg dose) by mouth 3 (three) times a day as needed for Cough for up to 7 days. Start date: 10/15/2020, End date: 10/22/2020   doxycycline monohydrate (MONODOX) 100 mg capsule Take one capsule (100 mg dose) by mouth every 12 (twelve) hours for 6 days. Start date: 10/15/2020, End date: 10/21/2020    CHANGED medications  Details  albuterol (ACCUNEB) 0.63 mg/3 mL nebulizer solution Take 3 mLs (0.63 mg dose) by nebulization every 6 (six) hours as needed for Wheezing. Start date: 10/15/2020   DULERA 200-5 MCG/ACT AERO Inhale two puffs into the lungs 2 (two) times daily. Start date: 10/15/2020   ipratropium (ATROVENT) 0.02% nebulizer solution Take 2.5 mLs (0.5 mg dose) by nebulization 4 (four) times daily. Start date: 10/15/2020   ipratropium bromide (ATROVENT HFA) 17 mcg/act inhaler Inhale two puffs into the lungs every 6 (six) hours. Start date: 10/15/2020   predniSONE (DELTASONE) 10 mg tablet Take four tablets daily. Reduce by one tablet every other day until taking 5mg  daily. Start date: 10/15/2020   PROAIR HFA 108 (90 Base) MCG/ACT inhaler Inhale two puffs into the lungs  every 6 (six) hours as needed for Wheezing or Shortness of Breath. Start date: 10/15/2020    CONTINUED medications  Details  gabapentin (NEURONTIN) 300 mg capsule Take 300 mg by mouth 2 (two) times a day as needed.   cyanocobalamin 500 MCG tablet Take 1 tablet by mouth daily.   folic acid 1 mg tablet Take 1 mg by mouth daily.  Hospital Course   Indication for Admission/chief complaint: COPD exacerbation  Hospital Course:  Patient is 50 year old female with a clinical history of severe emphysema and progressive pulmonary cachexia presented [facility] medical hospital with a 1-2-week history of progressive productive cough of greenish-yellow sputum associated with increased shortness of breath. Upon admission to [facility], chest x-ray showed significant disproportionate  hyperinflation of the lungs and bilateral interstitial infiltrates.    Now here for post hosp OV Patient states her breathing is better.  She has less mucus production.  She does have some wheezing and left-sided chest pain.  She was tested for Covid and was negative.  She is gone back to smoking cigarettes.  02/15/21 Since last ov :  In hosp in [place]    Shortness of Breath This is a chronic problem. The current episode started more than 1 year ago. The problem occurs daily. The problem has been gradually improving. Associated symptoms include chest pain, sputum production and wheezing. Pertinent negatives include no coryza, fever, headaches, hemoptysis, leg pain, leg swelling, neck pain, orthopnea, PND, rash, rhinorrhea, sore throat, swollen glands, syncope or vomiting. The symptoms are aggravated by exercise and animal exposure. Risk factors include smoking. She has tried steroid inhalers, ipratropium inhalers, oral steroids and beta agonist inhalers for the symptoms. The treatment provided moderate relief. Her past medical history is significant for chronic lung disease, COPD and pneumonia. There is no history of allergies, aspirin allergies, asthma, bronchiolitis, CAD, DVT, a heart failure, PE or a recent surgery.    Past Medical History:  Diagnosis Date  . CAP (community acquired pneumonia) 08/27/2019  . COPD (chronic obstructive pulmonary disease) (HCC)   . Depression   . Tobacco abuse      Family History  Problem Relation Age of Onset  . COPD Mother   . Lung cancer Father      Social History   Socioeconomic History  . Marital status: Single    Spouse name: Not on file  . Number of children: Not on file  . Years of education: Not on file  . Highest education level: Not on file  Occupational History  . Not on file  Tobacco Use  . Smoking status: Current Every Day Smoker    Packs/day: 1.00    Types: Cigarettes    Last attempt to quit: 01/15/2018    Years since quitting:  3.0  . Smokeless tobacco: Never Used  Vaping Use  . Vaping Use: Former  Substance and Sexual Activity  . Alcohol use: Yes    Alcohol/week: 1.0 standard drink    Types: 1 Cans of beer per week  . Drug use: No  . Sexual activity: Not Currently    Birth control/protection: None  Other Topics Concern  . Not on file  Social History Narrative  . Not on file   Social Determinants of Health   Financial Resource Strain: Not on file  Food Insecurity: Not on file  Transportation Needs: Not on file  Physical Activity: Not on file  Stress: Not on file  Social Connections: Not on file  Intimate Partner Violence: Not  on file     Allergies  Allergen Reactions  . Darvon [Propoxyphene] Other (See Comments)    Patient was aware her spoken words were not matching her thoughts     Outpatient Medications Prior to Visit  Medication Sig Dispense Refill  . albuterol (PROAIR HFA) 108 (90 Base) MCG/ACT inhaler Inhale 2 puffs into the lungs every 6 (six) hours as needed for wheezing or shortness of breath. Please make PCP appt - last fill! 8.5 g 3  . gabapentin (NEURONTIN) 300 MG capsule Take 1 capsule (300 mg total) by mouth 2 (two) times daily. 60 capsule 3  . ipratropium-albuterol (DUONEB) 0.5-2.5 (3) MG/3ML SOLN Take 3 mLs by nebulization every 6 (six) hours as needed. 360 mL 6  . mometasone-formoterol (DULERA) 200-5 MCG/ACT AERO Inhale 2 puffs into the lungs 2 (two) times daily. 13 g 6  . nicotine (NICODERM CQ - DOSED IN MG/24 HOURS) 21 mg/24hr patch Place 1 patch (21 mg total) onto the skin daily. 28 patch 0  . predniSONE (DELTASONE) 10 MG tablet Take 1 tablet (10 mg total) by mouth daily with breakfast. 60 tablet 2  . vitamin B-12 (CYANOCOBALAMIN) 500 MCG tablet Take 1 tablet by mouth daily.     No facility-administered medications prior to visit.    Review of Systems  Constitutional: Negative for fever.  HENT: Negative for rhinorrhea and sore throat.   Respiratory: Positive for sputum  production, shortness of breath and wheezing. Negative for hemoptysis.   Cardiovascular: Positive for chest pain. Negative for orthopnea, leg swelling, syncope and PND.  Gastrointestinal: Negative for vomiting.  Musculoskeletal: Negative for neck pain.  Skin: Negative for rash.  Neurological: Negative for headaches.       Objective:   Physical Exam There were no vitals filed for this visit.  Gen: Pleasant, well-nourished, in no distress,  normal affect  ENT: No lesions,  mouth clear,  oropharynx clear, no postnasal drip  Neck: No JVD, no TMG, no carotid bruits  Lungs: No use of accessory muscles, no dullness to percussion, hyperresonance to percussion and decreased breath sounds  Cardiovascular: RRR, heart sounds normal, no murmur or gallops, no peripheral edema,   Abdomen: soft and NT, no HSM,  BS normal  Musculoskeletal: No deformities, no cyanosis or clubbing No deformity in the feet are observed Neuro: alert, non focal  Skin: Warm, no lesions or rashes   CXR 10/30: IMPRESSION: 1. Near complete resolution of right upper lobe pneumonia. Small somewhat irregular opacity remains in the right upper lobe, potentially with central cavitation. One additional short interval radiographic follow-up could be performed to assess for resolution, alternatively chest CT could be obtained given underlying emphysema. 2. Small focal opacity at the lingula, atelectasis versus minimal infiltrate 3. Hyperinflated lungs with emphysema  Lab Results  Component Value Date   HGBA1C 5.5 04/01/2020        Assessment & Plan:  I personally reviewed all images and lab data in the [facility] system as well as any outside material available during this office visit and agree with the  radiology impressions.   No problem-specific Assessment & Plan notes found for this encounter.   There are no diagnoses linked to this encounter. Flu vaccine was given

## 2021-02-15 ENCOUNTER — Inpatient Hospital Stay: Payer: Medicaid Other | Admitting: Critical Care Medicine

## 2021-02-23 MED FILL — IPRAT-ALBUT 0.5-3(2.5) MG/3: 0.5-2.5 (3) | 30 days supply | Qty: 360 | Fill #2

## 2021-02-23 MED FILL — predniSONE 10 MG TABS: 10 | 30 days supply | Qty: 30 | Fill #3

## 2021-02-23 MED FILL — GABAPENTIN 300 MG CAPSULE: 300 | 30 days supply | Qty: 60 | Fill #2

## 2021-02-23 MED FILL — DULERA 200-5 MCG/ACT AERO: 200-5 | 30 days supply | Qty: 13 | Fill #2

## 2021-02-23 MED FILL — PROAIR HFA 90 MCG INHALER: 108 (90 BAS | 25 days supply | Qty: 9 | Fill #3

## 2021-03-06 ENCOUNTER — Other Ambulatory Visit: Payer: Self-pay

## 2021-03-14 NOTE — Progress Notes (Incomplete)
Subjective:    Patient ID: Elizabeth Villarreal, female    DOB: [birth year], 50 y.o.   MRN: [id number]   11/20/19 This is a 50 year old female actually seen previously at community health and wellness clinic in September 2019.  Patient has a history of COPD but actually more recently was admitted for 9 days at [facility] for right upper lobe pneumonia and sepsis.  The patient actually left the [place] area and went to live in [place] with a relative but when they lost housing there she returned to the area during the midst of the Covid crisis and was in a quarantine at the remodel and and then from there came to the homeless shelter where she has now been for several weeks.  The patient's issue today is that she is run out of her medications and needs follow-up.  She also needs follow-up on the fact that she has had pneumonia.  Below is a copy of the discharge summary She was admitted September 22nd through October 1 History of present illness: -50 y.o.femalewithhistory of COPD previous tobacco abuse recently placed on gabapentin for neuropathy which patient has not taken presents to the ER with complaint of fever chills. Patient states her symptoms started this morning and has not had any productive cough until patient reached ER. Has not had any recent travel but has been living in the shelter for last 1 month  Hospital Course:  1. Sepsis/community-acquired pneumonia -Secondary to community-acquired pneumonia, clinically improved significantly with IV ceftriaxone and azithromycin, sputum cultures were negative -COVID-19 PCR is negative -Given upper lobe infiltrates and as she lives in a shelter,a QuantiFERON-TB gold was sent out on admission, this was noted to be in the indeterminant, case was discussed by my partner with infectious disease, subsequently had AFB smears sent which were negative -Hence no further work-up was not felt to be indicated  2.COPD exacerbation,  pneumonia  -Clinically improved as above, transition to prednisone taper and oral antibiotics to complete course -Recommend follow-up chest x-ray in 4 to 6 weeks  3.Peripheral neuropathy, continue gabapentin  4.Tobacco abuse -Quit smoking 2 years ago, counseled   Currently her shortness of breath is improved.  She is not able to use the nebulizer and homeless shelter because of aerosolization is not asking for an HFA substitute.  She is off prednisone.  She states prednisone has been helpful to her.   On exam blood pressure is 118/72 temps 97 saturation 96% room air pulse 80 chest show distant breath sounds without wheeze rale or rhonchi cardiac exam showed a regular rate rhythm without S3-S4 normal S1-S2 abdomen soft nontender extremities show no edema  Impression is that of COPD asthmatic bronchitic component  I will refill the patient's prednisone at a dose of 5 mg daily we will also refill her Dulera 2 but increase the strength to 200 mcg at 2 puffs twice daily as well she will receive an Atrovent inhaler at 2 puffs 4 times daily in place of her ipratropium minutes in the nebulizer   We will also establish her back into the health and wellness clinic and obtain a chest x-ray as well to follow-up the right upper lobe pneumonia to ensure it has cleared   10/07/2019 The patient returns to reestablish in the clinic after having moved away to [place] for a 16-month interval having returned to this area 2 months ago.  The patient currently is homeless and staying at the [facility].  She recently was admitted  as noted above for COPD exacerbation.  We then saw her back in the homeless shelter clinic and restarted the [facility] and Atrovent inhalers and the patient finished her course of prednisone and now is maintaining 5 mg daily of prednisone as a maintenance.  The patient states her breathing is improved.  She is not smoked in 2 years.  She had a repeat chest x-ray  which showed improvement in the right upper lobe pneumonia.  There is still some small cavitary change in the right upper lobe and left lower lobe nodule seen.  The patient needs a repeat CT scan of the chest for the same.  Currently there is no cough there is some chest tightness and soreness behind the shoulder blades.  She is still working on housing.  Note during the hospitalization the patient did receive a flu vaccine    04/01/2020 Since the last visit in November the patient had developed increased calf pain and claudication in both lower extremities.  A follow-up ABI was performed and was normal for circulatory issues.  The patient does underlying COPD and was also concerned about diabetes.  She comes into the office today with a blood glucose of 92 and hemoglobin A1c of 5.4 She denies any rash.  She feels that she is having more fatigue at this time.  She is working on obtaining a job and has applied with partners for ending homelessness for assistance in obtaining housing Note CT of the chest in November showed resolution of pneumonia and no active nodule seen  11/04/20 Patient is seen today in return follow-up not having been seen in this clinic since April.  The patient was admitted between the eighth and 11 November for COPD exacerbation and bilateral pneumonitis.  She was treated with broad-spectrum antibiotics steroids and neb treatments.  She was at the [facility].  Below is a copy of the discharge summary  Admit date: 10/12/2020 Discharge date and time: 10/15/2020 Hospital LOS: 4 days  Active Hospital Problems  Diagnosis Date Noted POA  . *COPD with acute exacerbation (*) 10/12/2020 Yes  . Bilateral pneumonia 10/13/2020 Yes   Resolved Hospital Problems  No resolved problems to display.   Current Discharge Medication List   DISCONTINUED medications   ALBUTEROL IN    NEW medications  Details  benzonatate (TESSALON) 100 mg capsule  Take one capsule (100 mg dose) by mouth 3 (three) times a day as needed for Cough for up to 7 days. Start date: 10/15/2020, End date: 10/22/2020   doxycycline monohydrate (MONODOX) 100 mg capsule Take one capsule (100 mg dose) by mouth every 12 (twelve) hours for 6 days. Start date: 10/15/2020, End date: 10/21/2020    CHANGED medications  Details  albuterol (ACCUNEB) 0.63 mg/3 mL nebulizer solution Take 3 mLs (0.63 mg dose) by nebulization every 6 (six) hours as needed for Wheezing. Start date: 10/15/2020   DULERA 200-5 MCG/ACT AERO Inhale two puffs into the lungs 2 (two) times daily. Start date: 10/15/2020   ipratropium (ATROVENT) 0.02% nebulizer solution Take 2.5 mLs (0.5 mg dose) by nebulization 4 (four) times daily. Start date: 10/15/2020   ipratropium bromide (ATROVENT HFA) 17 mcg/act inhaler Inhale two puffs into the lungs every 6 (six) hours. Start date: 10/15/2020   predniSONE (DELTASONE) 10 mg tablet Take four tablets daily. Reduce by one tablet every other day until taking 5mg  daily. Start date: 10/15/2020   PROAIR HFA 108 (90 Base) MCG/ACT inhaler Inhale two puffs into the lungs  every 6 (six) hours as needed for Wheezing or Shortness of Breath. Start date: 10/15/2020    CONTINUED medications  Details  gabapentin (NEURONTIN) 300 mg capsule Take 300 mg by mouth 2 (two) times a day as needed.   cyanocobalamin 500 MCG tablet Take 1 tablet by mouth daily.   folic acid 1 mg tablet Take 1 mg by mouth daily.  Hospital Course   Indication for Admission/chief complaint: COPD exacerbation  Hospital Course:  Patient is 50 year old female with a clinical history of severe emphysema and progressive pulmonary cachexia presented [facility] medical hospital with a 1-2-week history of progressive productive cough of greenish-yellow sputum associated with increased shortness of breath. Upon admission to [facility], chest x-ray showed significant disproportionate  hyperinflation of the lungs and bilateral interstitial infiltrates.    Now here for post hosp OV Patient states her breathing is better.  She has less mucus production.  She does have some wheezing and left-sided chest pain.  She was tested for Covid and was negative.  She is gone back to smoking cigarettes.   Shortness of Breath This is a chronic problem. The current episode started more than 1 year ago. The problem occurs daily. The problem has been gradually improving. Associated symptoms include chest pain, sputum production and wheezing. Pertinent negatives include no coryza, fever, headaches, hemoptysis, leg pain, leg swelling, neck pain, orthopnea, PND, rash, rhinorrhea, sore throat, swollen glands, syncope or vomiting. The symptoms are aggravated by exercise and animal exposure. Risk factors include smoking. She has tried steroid inhalers, ipratropium inhalers, oral steroids and beta agonist inhalers for the symptoms. The treatment provided moderate relief. Her past medical history is significant for chronic lung disease, COPD and pneumonia. There is no history of allergies, aspirin allergies, asthma, bronchiolitis, CAD, DVT, a heart failure, PE or a recent surgery.    Past Medical History:  Diagnosis Date  . CAP (community acquired pneumonia) 08/27/2019  . COPD (chronic obstructive pulmonary disease) (HCC)   . Depression   . Tobacco abuse      Family History  Problem Relation Age of Onset  . COPD Mother   . Lung cancer Father      Social History   Socioeconomic History  . Marital status: Single    Spouse name: Not on file  . Number of children: Not on file  . Years of education: Not on file  . Highest education level: Not on file  Occupational History  . Not on file  Tobacco Use  . Smoking status: Current Every Day Smoker    Packs/day: 1.00    Types: Cigarettes    Last attempt to quit: 01/15/2018    Years since quitting: 3.1  . Smokeless tobacco: Never Used  Vaping  Use  . Vaping Use: Former  Substance and Sexual Activity  . Alcohol use: Yes    Alcohol/week: 1.0 standard drink    Types: 1 Cans of beer per week  . Drug use: No  . Sexual activity: Not Currently    Birth control/protection: None  Other Topics Concern  . Not on file  Social History Narrative  . Not on file   Social Determinants of Health   Financial Resource Strain: Not on file  Food Insecurity: Not on file  Transportation Needs: Not on file  Physical Activity: Not on file  Stress: Not on file  Social Connections: Not on file  Intimate Partner Violence: Not on file     Allergies  Allergen Reactions  . Darvon [  Propoxyphene] Other (See Comments)    Patient was aware her spoken words were not matching her thoughts     Outpatient Medications Prior to Visit  Medication Sig Dispense Refill  . albuterol (VENTOLIN HFA) 108 (90 Base) MCG/ACT inhaler INHALE 2 PUFFS INTO THE LUNGS EVERY 6 (SIX) HOURS AS NEEDED FOR WHEEZING OR SHORTNESS OF BREATH. PLEASE MAKE PCP APPT - LAST FILL! 8.5 g 3  . gabapentin (NEURONTIN) 300 MG capsule TAKE 1 CAPSULE (300 MG TOTAL) BY MOUTH 2 (TWO) TIMES DAILY. 60 capsule 3  . ipratropium-albuterol (DUONEB) 0.5-2.5 (3) MG/3ML SOLN TAKE 3 MLS BY NEBULIZATION EVERY 6 (SIX) HOURS AS NEEDED. 360 mL 6  . mometasone-formoterol (DULERA) 200-5 MCG/ACT AERO INHALE 2 PUFFS INTO THE LUNGS 2 (TWO) TIMES DAILY. 13 g 6  . nicotine (NICODERM CQ - DOSED IN MG/24 HOURS) 21 mg/24hr patch PLACE 1 PATCH (21 MG TOTAL) ONTO THE SKIN DAILY. 28 patch 0  . predniSONE (DELTASONE) 10 MG tablet TAKE 1 TABLET (10 MG TOTAL) BY MOUTH DAILY WITH BREAKFAST. 60 tablet 2  . vitamin B-12 (CYANOCOBALAMIN) 500 MCG tablet Take 1 tablet by mouth daily.     No facility-administered medications prior to visit.    Review of Systems  Constitutional: Negative for fever.  HENT: Negative for rhinorrhea and sore throat.   Respiratory: Positive for sputum production, shortness of breath and wheezing.  Negative for hemoptysis.   Cardiovascular: Positive for chest pain. Negative for orthopnea, leg swelling, syncope and PND.  Gastrointestinal: Negative for vomiting.  Musculoskeletal: Negative for neck pain.  Skin: Negative for rash.  Neurological: Negative for headaches.       Objective:   Physical Exam There were no vitals filed for this visit.  Gen: Pleasant, well-nourished, in no distress,  normal affect  ENT: No lesions,  mouth clear,  oropharynx clear, no postnasal drip  Neck: No JVD, no TMG, no carotid bruits  Lungs: No use of accessory muscles, no dullness to percussion, hyperresonance to percussion and decreased breath sounds  Cardiovascular: RRR, heart sounds normal, no murmur or gallops, no peripheral edema,   Abdomen: soft and NT, no HSM,  BS normal  Musculoskeletal: No deformities, no cyanosis or clubbing No deformity in the feet are observed Neuro: alert, non focal  Skin: Warm, no lesions or rashes   CXR 10/30: IMPRESSION: 1. Near complete resolution of right upper lobe pneumonia. Small somewhat irregular opacity remains in the right upper lobe, potentially with central cavitation. One additional short interval radiographic follow-up could be performed to assess for resolution, alternatively chest CT could be obtained given underlying emphysema. 2. Small focal opacity at the lingula, atelectasis versus minimal infiltrate 3. Hyperinflated lungs with emphysema  Lab Results  Component Value Date   HGBA1C 5.5 04/01/2020        Assessment & Plan:  I personally reviewed all images and lab data in the [facility] system as well as any outside material available during this office visit and agree with the  radiology impressions.   No problem-specific Assessment & Plan notes found for this encounter.   There are no diagnoses linked to this encounter. Flu vaccine was given

## 2021-03-15 ENCOUNTER — Inpatient Hospital Stay: Payer: Medicaid Other | Admitting: Critical Care Medicine

## 2021-03-31 ENCOUNTER — Other Ambulatory Visit: Payer: Self-pay | Admitting: Critical Care Medicine

## 2021-03-31 DIAGNOSIS — J449 Chronic obstructive pulmonary disease, unspecified: Secondary | ICD-10-CM

## 2021-03-31 MED ORDER — ALBUTEROL SULFATE HFA 108 (90 BASE) MCG/ACT IN AERS
INHALATION_SPRAY | RESPIRATORY_TRACT | 2 refills | Status: AC
  Filled 2021-03-31: qty 8.5, 25d supply, fill #0
  Filled 2021-04-29: qty 8.5, 25d supply, fill #1

## 2021-03-31 MED FILL — Mometasone Furoate-Formoterol Fumarate Aerosol 200-5 MCG/ACT: RESPIRATORY_TRACT | 30 days supply | Qty: 13 | Fill #0 | Status: AC

## 2021-03-31 MED FILL — Ipratropium-Albuterol Nebu Soln 0.5-2.5(3) MG/3ML: RESPIRATORY_TRACT | 15 days supply | Qty: 360 | Fill #0 | Status: AC

## 2021-03-31 MED FILL — Gabapentin Cap 300 MG: ORAL | 30 days supply | Qty: 60 | Fill #0 | Status: AC

## 2021-03-31 MED FILL — Prednisone Tab 10 MG: ORAL | 30 days supply | Qty: 30 | Fill #0 | Status: AC

## 2021-03-31 NOTE — Telephone Encounter (Signed)
Requested Prescriptions  Pending Prescriptions Disp Refills  . albuterol (PROAIR HFA) 108 (90 Base) MCG/ACT inhaler 8.5 g 2    Sig: INHALE 2 PUFFS INTO THE LUNGS EVERY 6 (SIX) HOURS AS NEEDED FOR WHEEZING OR SHORTNESS OF BREATH. PLEASE MAKE PCP APPT - LAST FILL!     Pulmonology:  Beta Agonists Failed - 03/31/2021 10:13 PM      Failed - One inhaler should last at least one month. If the patient is requesting refills earlier, contact the patient to check for uncontrolled symptoms.      Passed - Valid encounter within last 12 months    Recent Outpatient Visits          4 months ago Community acquired pneumonia, unspecified laterality   [facility] Storm Frisk, MD   12 months ago COPD GOLD D  with chronic bronchitis [facility])   [place] [facility] Storm Frisk, MD   1 year ago COPD GOLD D  with chronic bronchitis [facility])   [place] [facility] Storm Frisk, MD   1 year ago COPD GOLD D  with chronic bronchitis [facility])   [place] Marcine Matar, MD   1 year ago COPD GOLD D  with chronic bronchitis [facility])    [facility] Storm Frisk, MD

## 2021-04-01 ENCOUNTER — Other Ambulatory Visit: Payer: Self-pay

## 2021-04-02 ENCOUNTER — Other Ambulatory Visit: Payer: Self-pay

## 2021-04-05 ENCOUNTER — Ambulatory Visit: Payer: Self-pay | Admitting: *Deleted

## 2021-04-05 DIAGNOSIS — U071 COVID-19: Secondary | ICD-10-CM

## 2021-04-05 NOTE — Telephone Encounter (Signed)
Called patient to review questions concerning positive covid test and what to do Patient c/o cough , congestion, coughing up yellow thick mucus, sneezing and shortness of breath at rest. Hx COPD. Patient tested positive for covid today from [facility] rapid test. C/o chills, and using more of her rescue inhaler than normal. Denies chest pain, difficulty breathing. Reviewed CDC guidelines for quarantine for positive covid status. Criteria for self-isolation if you test positive for COVID-19, regardless of vaccination status:  -If you have mild symptoms that are resolving or have resolved, isolate at home for 5 days since symptoms started AND continue to wear a well-fitted mask when around others in the home and in public for 5 additional days after isolation is completed -If you have a fever and/or moderate to severe symptoms, isolate for at least 10 days since the symptoms started AND until you are fever free for at least 24 hours without the use of fever-reducing medications -If you tested positive and did not have symptoms, isolate for at least 5 days after your positive test  Use over-the-counter medications for symptoms.If you develop respiratory issues/distress, seek medical care in the Emergency Department.  If you must leave home or if you have to be around others please wear a mask. Please limit contact with immediate family members in the home, practice social distancing, frequent handwashing and clean hard surfaces touched frequently with household cleaning products. Patient is requesting if she can be prescribed any medication to prevent COPD exacerbation. Care advise given. Patient verbalized understanding of care advise and to call back or go to [facility] or ED or call 911 if symptoms worsen.   Answer Assessment - Initial Assessment Questions 1. COVID-19 DIAGNOSIS: "Who made your COVID-19 diagnosis?" "Was it confirmed by a positive lab test or self-test?" If not diagnosed by a doctor (or NP/PA),  ask "Are there lots of cases (community spread) where you live?" Note: See public health department website, if unsure.     [facility] rapid test  Today  2. COVID-19 EXPOSURE: "Was there any known exposure to COVID before the symptoms began?" CDC Definition of close contact: within 6 feet (2 meters) for a total of 15 minutes or more over a 24-hour period.      na 3. ONSET: "When did the COVID-19 symptoms start?"      *No Answer* 4. WORST SYMPTOM: "What is your worst symptom?" (e.g., cough, fever, shortness of breath, muscle aches)     SOB, cough , chills muscles aches  5. COUGH: "Do you have a cough?" If Yes, ask: "How bad is the cough?"       *No Answer* 6. FEVER: "Do you have a fever?" If Yes, ask: "What is your temperature, how was it measured, and when did it start?"     *No Answer* 7. RESPIRATORY STATUS: "Describe your breathing?" (e.g., shortness of breath, wheezing, unable to speak)      SOB, end stage COPD 8. BETTER-SAME-WORSE: "Are you getting better, staying the same or getting worse compared to yesterday?"  If getting worse, ask, "In what way?"     worse 9. HIGH RISK DISEASE: "Do you have any chronic medical problems?" (e.g., asthma, heart or lung disease, weak immune system, obesity, etc.)     *No Answer* 10. VACCINE: "Have you had the COVID-19 vaccine?" If Yes, ask: "Which one, how many shots, when did you get it?"       1st and 2nd Pfizer  11. BOOSTER: "Have you received your COVID-19 booster?" If  Yes, ask: "Which one and when did you get it?"       No  12. PREGNANCY: "Is there any chance you are pregnant?" "When was your last menstrual period?"       na 13. OTHER SYMPTOMS: "Do you have any other symptoms?"  (e.g., chills, fatigue, headache, loss of smell or taste, muscle pain, sore throat)       Loss taste and smell 14. O2 SATURATION MONITOR:  "Do you use an oxygen saturation monitor (pulse oximeter) at home?" If Yes, ask "What is your reading (oxygen level) today?" "What is  your usual oxygen saturation reading?" (e.g., 95%)       *No Answer*  Protocols used: CORONAVIRUS (COVID-19) DIAGNOSED OR SUSPECTED-A-AH

## 2021-04-05 NOTE — Telephone Encounter (Signed)
  Reason for Disposition . HIGH RISK for severe COVID complications (e.g., weak immune system, age > 64 years, obesity with BMI > 25, pregnant, chronic lung disease or other chronic medical condition)  (Exception: Already seen by PCP and no new or worsening symptoms.)  Protocols used: CORONAVIRUS (COVID-19) DIAGNOSED OR SUSPECTED-A-AH

## 2021-04-06 ENCOUNTER — Telehealth: Payer: Self-pay | Admitting: Critical Care Medicine

## 2021-04-06 DIAGNOSIS — U071 COVID-19: Secondary | ICD-10-CM | POA: Insufficient documentation

## 2021-04-06 MED ORDER — NIRMATRELVIR/RITONAVIR (PAXLOVID)TABLET: 3.0000 | ORAL_TABLET | Freq: Two times a day (BID) | ORAL | 0 refills | Status: AC

## 2021-04-06 MED ORDER — NIRMATRELVIR/RITONAVIR (PAXLOVID)TABLET: 3.0000 | ORAL_TABLET | Freq: Two times a day (BID) | ORAL | 0 refills | Status: DC

## 2021-04-06 NOTE — Telephone Encounter (Signed)
Pt very high risk.  I am putting in [facility] referral

## 2021-04-06 NOTE — Telephone Encounter (Signed)
Patient is COVID-positive  She is high risk  She has a normal creatinine and is not on any conflicting medications  I sent a prescription for Paxil over to her local pharmacy in [facility] where she is traveling her test was positive over the weekend was a home test  The pharmacy confirms they received the prescription they do have the drug and will release it to the patient the patient knows to pick the medication up and will take the normal dose for 5 days  Outpatient Oral COVID Treatment Note  I connected with Annye Asa on 04/06/2021/10:35 AM by telephone and verified that I am speaking with the correct person using two identifiers.  I discussed the limitations, risks, security, and privacy concerns of performing an evaluation and management service by telephone and the availability of in person appointments. I also discussed with the patient that there may be a patient responsible charge related to this service. The patient expressed understanding and agreed to proceed.  Patient location: at hotel in [facility] [place] Provider location: my office  Diagnosis: COVID-19 infection  Purpose of visit: Discussion of potential use of Molnupiravir or Paxlovid, a new treatment for mild to moderate COVID-19 viral infection in non-hospitalized patients.   Subjective: Patient is a 50 y.o. female who has been diagnosed with COVID 19 viral infection.  Their symptoms began on may 1 with CLI symptoms   Past Medical History:  Diagnosis Date  . CAP (community acquired pneumonia) 08/27/2019  . COPD (chronic obstructive pulmonary disease) (HCC)   . Depression   . Tobacco abuse     Allergies  Allergen Reactions  . Darvon [Propoxyphene] Other (See Comments)    Patient was aware her spoken words were not matching her thoughts     Current Outpatient Medications:  .  albuterol (PROAIR HFA) 108 (90 Base) MCG/ACT inhaler, INHALE 2 PUFFS INTO THE LUNGS EVERY 6 (SIX) HOURS AS NEEDED FOR WHEEZING  OR SHORTNESS OF BREATH. PLEASE MAKE PCP APPT - LAST FILL!, Disp: 8.5 g, Rfl: 2 .  gabapentin (NEURONTIN) 300 MG capsule, TAKE 1 CAPSULE (300 MG TOTAL) BY MOUTH 2 (TWO) TIMES DAILY., Disp: 60 capsule, Rfl: 3 .  ipratropium-albuterol (DUONEB) 0.5-2.5 (3) MG/3ML SOLN, TAKE 3 MLS BY NEBULIZATION EVERY 6 (SIX) HOURS AS NEEDED., Disp: 360 mL, Rfl: 6 .  mometasone-formoterol (DULERA) 200-5 MCG/ACT AERO, INHALE 2 PUFFS INTO THE LUNGS 2 (TWO) TIMES DAILY., Disp: 13 g, Rfl: 6 .  nicotine (NICODERM CQ - DOSED IN MG/24 HOURS) 21 mg/24hr patch, PLACE 1 PATCH (21 MG TOTAL) ONTO THE SKIN DAILY., Disp: 28 patch, Rfl: 0 .  nirmatrelvir/ritonavir EUA (PAXLOVID) TABS, Take 3 tablets by mouth 2 (two) times daily for 5 days. Patient GFR is 106 Take nirmatrelvir (150 mg) 3 tablet(s) twice daily for 5 days and ritonavir (100 mg) one tablet twice daily for 5 days., Disp: 30 tablet, Rfl: 0 .  predniSONE (DELTASONE) 10 MG tablet, TAKE 1 TABLET (10 MG TOTAL) BY MOUTH DAILY WITH BREAKFAST., Disp: 60 tablet, Rfl: 2 .  vitamin B-12 (CYANOCOBALAMIN) 500 MCG tablet, Take 1 tablet by mouth daily., Disp: , Rfl:    Laboratory Data:  No results found for this or any previous visit (from the past 2160 hour(s)).   Assessment: 50 y.o. female with mild/moderate COVID 19 viral infection diagnosed on 04/04/21 at high risk for progression to severe COVID 19.  Plan:  This patient is a 50 y.o. female that meets the following criteria for Emergency Use  Authorization of: Paxlovid 1. Age >12 yr AND > 40 kg 2. SARS-COV-2 positive test 3. Symptom onset < 5 days 4. Mild-to-moderate COVID disease with high risk for severe progression to hospitalization or death  I have spoken and communicated the following to the patient or parent/caregiver regarding: 1. Paxlovid is an unapproved drug that is authorized for use under an Emergency Use Authorization.  2. There are no adequate, approved, available products for the treatment of COVID-19 in adults  who have mild-to-moderate COVID-19 and are at high risk for progressing to severe COVID-19, including hospitalization or death. 3. Other therapeutics are currently authorized. For additional information on all products authorized for treatment or prevention of COVID-19, please see [email or web address]-[email or web address]  4. There are benefits and risks of taking this treatment as outlined in the "Fact Sheet for Patients and Caregivers."  5. "Fact Sheet for Patients and Caregivers" was reviewed with patient. A hard copy will be provided to patient from pharmacy prior to the patient receiving treatment. 6. Patients should continue to self-isolate and use infection control measures (e.g., wear mask, isolate, social distance, avoid sharing personal items, clean and disinfect "high touch" surfaces, and frequent handwashing) according to CDC guidelines.  7. The patient or parent/caregiver has the option to accept or refuse treatment. 8. Patient medication history was reviewed for potential drug interactions:No drug interactions 9. Patient's GFR was calculated to be 102, and they were therefore prescribed Normal dose (GFR>60) - nirmatrelvir 150mg  tab (2 tablet) by mouth twice daily AND ritonavir 100mg  tab (1 tablet) by mouth twice daily   After reviewing above information with the patient, the patient agrees to receive Paxlovid.  Follow up instructions:    . Take prescription BID x 5 days as directed . Reach out to pharmacist for counseling on medication if desired . For concerns regarding further COVID symptoms please follow up with your PCP or urgent care . For urgent or life-threatening issues, seek care at your local emergency department  The patient was provided an opportunity to ask questions, and all were answered. The patient agreed with the plan and demonstrated an understanding of the instructions.   Script  sent to [facility] [place]  , MD 04/06/2021 /10:35 AM

## 2021-04-06 NOTE — Telephone Encounter (Signed)
Pt tested positive for Covid any recommendations for her?

## 2021-04-29 ENCOUNTER — Other Ambulatory Visit: Payer: Self-pay

## 2021-04-29 ENCOUNTER — Other Ambulatory Visit: Payer: Self-pay | Admitting: Critical Care Medicine

## 2021-04-29 DIAGNOSIS — G5793 Unspecified mononeuropathy of bilateral lower limbs: Secondary | ICD-10-CM

## 2021-04-29 MED ORDER — GABAPENTIN 300 MG PO CAPS
ORAL_CAPSULE | Freq: Two times a day (BID) | ORAL | 3 refills | Status: AC
Start: 2021-04-29 — End: 2022-04-29
  Filled 2021-04-29: qty 60, 30d supply, fill #0
  Filled 2022-01-13: qty 60, fill #0
  Filled 2022-01-14: qty 60, 30d supply, fill #0

## 2021-04-29 MED FILL — Mometasone Furoate-Formoterol Fumarate Aerosol 200-5 MCG/ACT: RESPIRATORY_TRACT | 30 days supply | Qty: 13 | Fill #1 | Status: CN

## 2021-04-29 MED FILL — Prednisone Tab 10 MG: ORAL | 30 days supply | Qty: 30 | Fill #1 | Status: CN

## 2021-04-29 MED FILL — Ipratropium-Albuterol Nebu Soln 0.5-2.5(3) MG/3ML: RESPIRATORY_TRACT | 15 days supply | Qty: 360 | Fill #1 | Status: CN

## 2021-04-29 NOTE — Telephone Encounter (Signed)
Requested medication (s) are due for refill today: Yes  Requested medication (s) are on the active medication list: Yes  Last refill:  11/05/20  Future visit scheduled: No  Notes to clinic:  Pt. Also requesting new nebulizer mouth piece and filter.    Requested Prescriptions  Pending Prescriptions Disp Refills   nicotine (NICODERM CQ - DOSED IN MG/24 HOURS) 21 mg/24hr patch 28 patch 0    Sig: PLACE 1 PATCH (21 MG TOTAL) ONTO THE SKIN DAILY.      Psychiatry:  Drug Dependence Therapy Passed - 04/29/2021  1:35 PM      Passed - Valid encounter within last 12 months    Recent Outpatient Visits           5 months ago Community acquired pneumonia, unspecified laterality   [facility] Storm Frisk, MD   1 year ago COPD GOLD D  with chronic bronchitis (HCC)   [facility] Storm Frisk, MD   1 year ago COPD GOLD D  with chronic bronchitis [facility])   [facility] Storm Frisk, MD   1 year ago COPD GOLD D  with chronic bronchitis [facility])   [facility] Marcine Matar, MD   1 year ago COPD GOLD D  with chronic bronchitis [facility])   [facility] Storm Frisk, MD

## 2021-04-30 ENCOUNTER — Other Ambulatory Visit: Payer: Self-pay

## 2021-05-04 ENCOUNTER — Other Ambulatory Visit: Payer: Self-pay

## 2021-05-06 ENCOUNTER — Other Ambulatory Visit: Payer: Self-pay

## 2021-05-10 ENCOUNTER — Other Ambulatory Visit: Payer: Self-pay

## 2021-05-11 ENCOUNTER — Other Ambulatory Visit: Payer: Self-pay

## 2021-06-08 ENCOUNTER — Other Ambulatory Visit: Payer: Self-pay

## 2021-10-11 IMAGING — CT CT CHEST W/O CM
2 of 3 series · 15 of 36 positions shown, 18 images · non-contrast
Comparison: Chest x-ray 10/04/2019, CT chest of 01/28/2018

CLINICAL DATA: Lung nodule, recently hospitalized with pneumonia.

EXAM:
CT CHEST WITHOUT CONTRAST
TECHNIQUE: Multidetector CT imaging of the chest was performed following the
standard protocol without IV contrast.

[Series 3: chest w/o 2mm st · axial · non-contrast · 0.69mm/px · z∈[+1159,+1453]mm · 12 of 173 slices shown, 15 images]
[im 13/173  mediastinal]
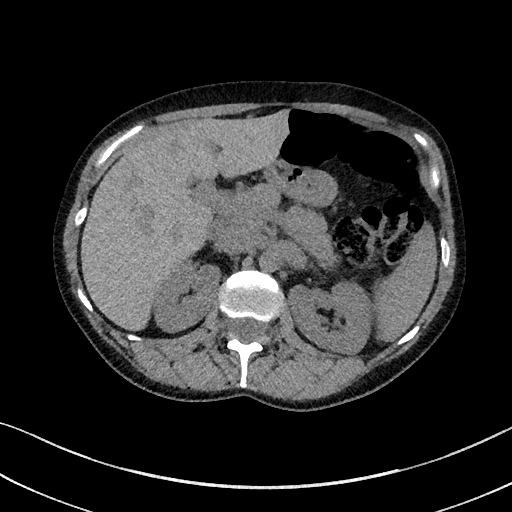
[im 13/173  lung]
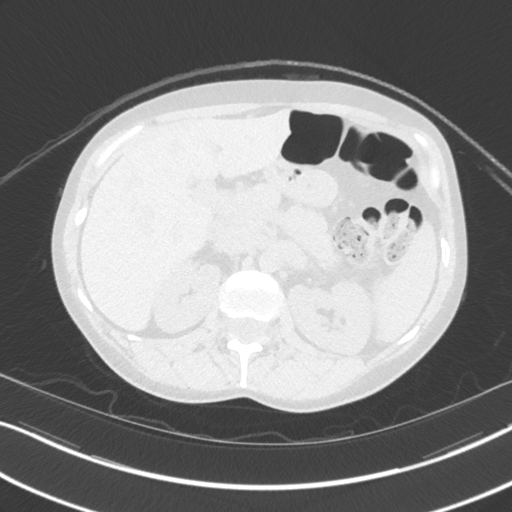
[im 26/173  lung]
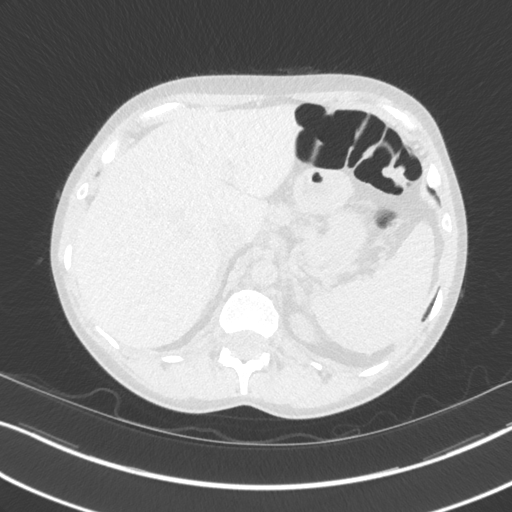
[im 39/173  lung]
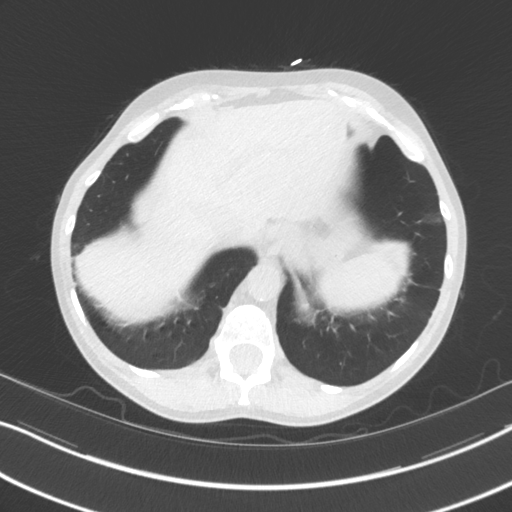
[im 51/173  lung]
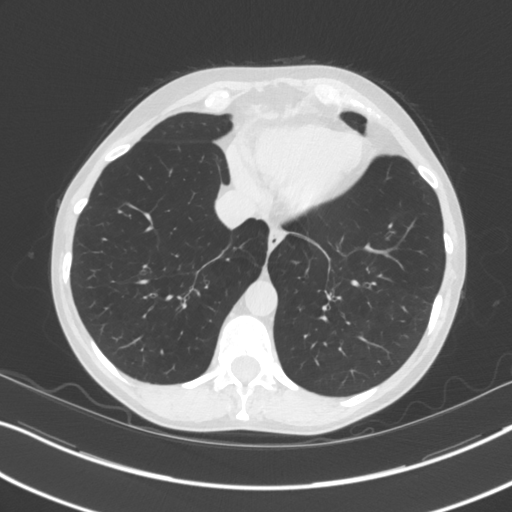
[im 64/173  mediastinal]
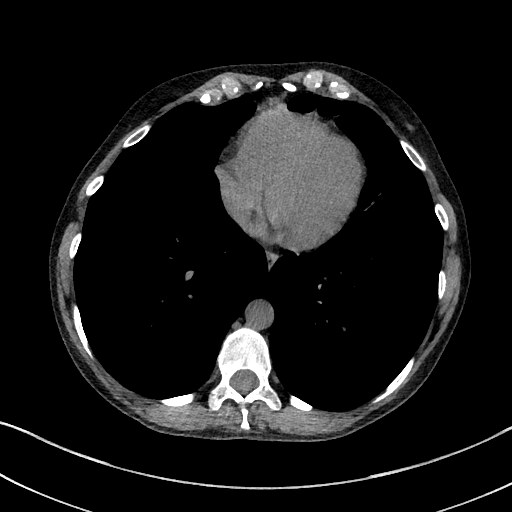
[im 64/173  lung]
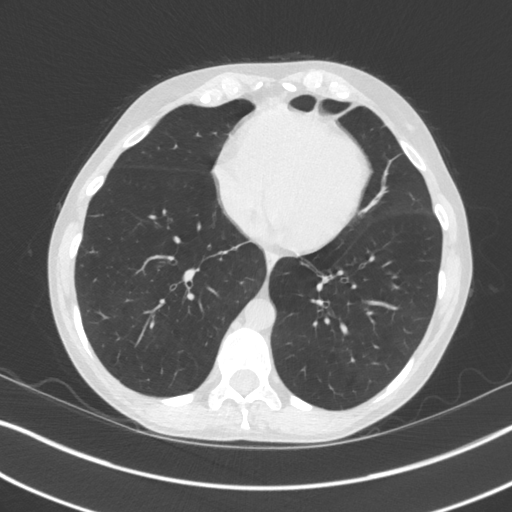
[im 77/173  lung]
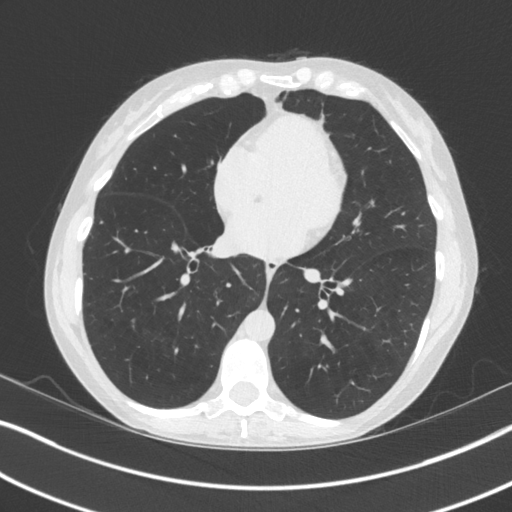
[im 96/173  lung]
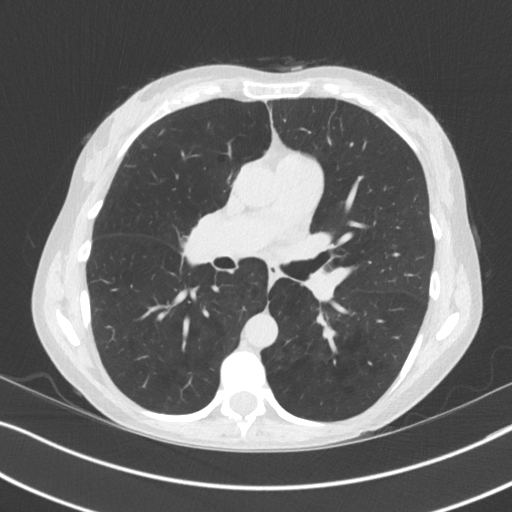
[im 109/173  lung]
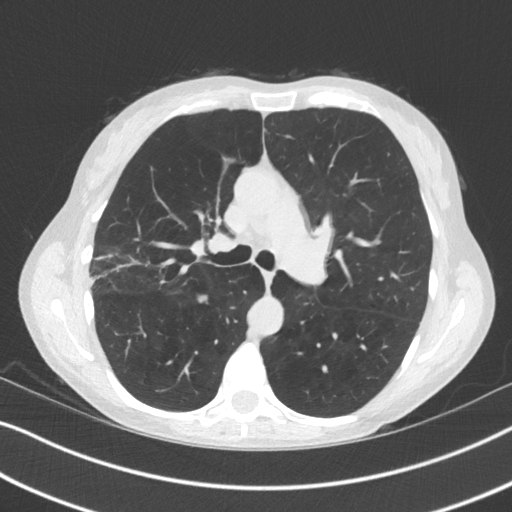
[im 122/173  mediastinal]
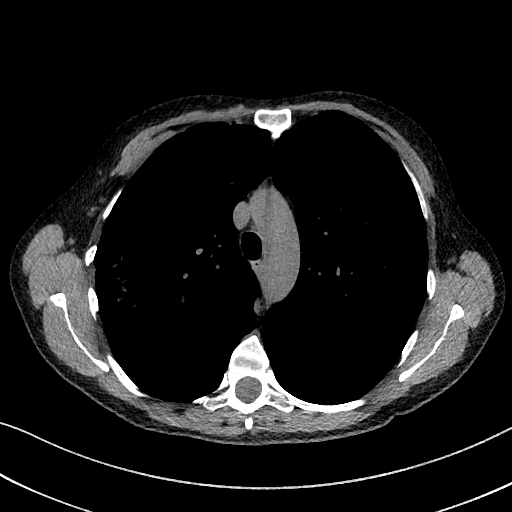
[im 122/173  lung]
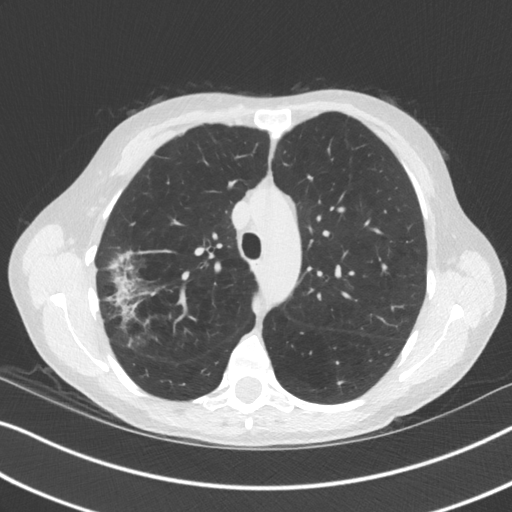
[im 134/173  lung]
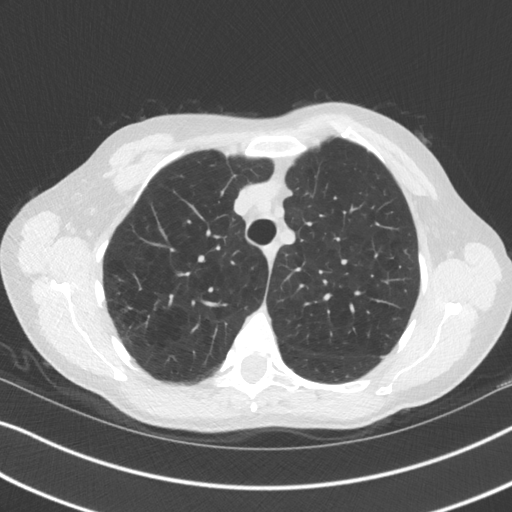
[im 147/173  lung]
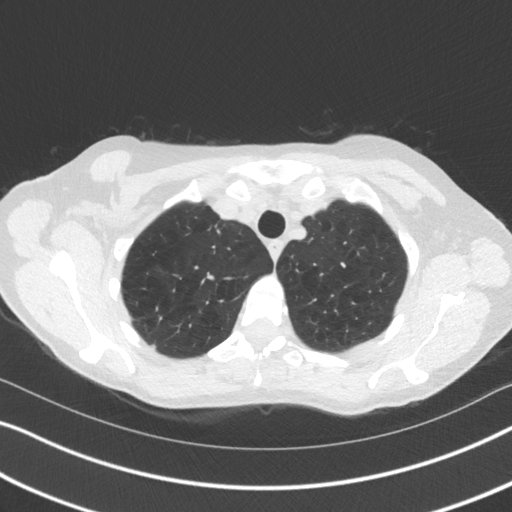
[im 160/173  lung]
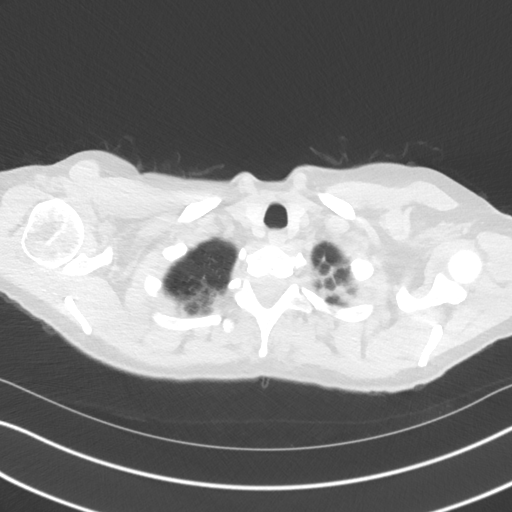

[Series 5: chest w/o 3mm st cor · coronal · non-contrast · 0.68mm/px · 3 of 101 slices shown]
[im 21/101  lung]
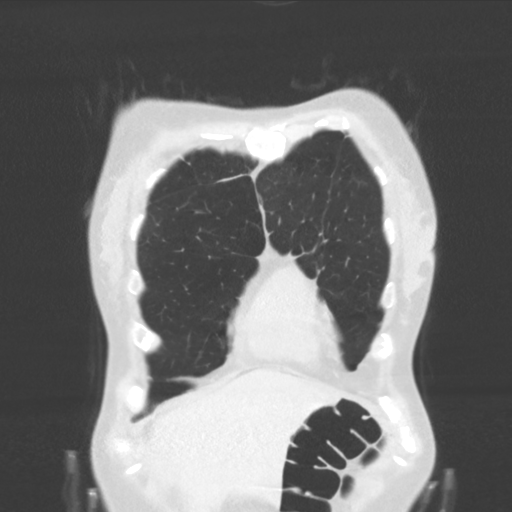
[im 41/101  lung]
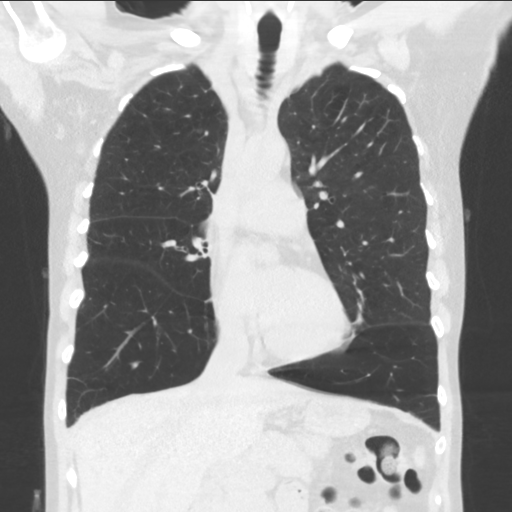
[im 61/101  lung]
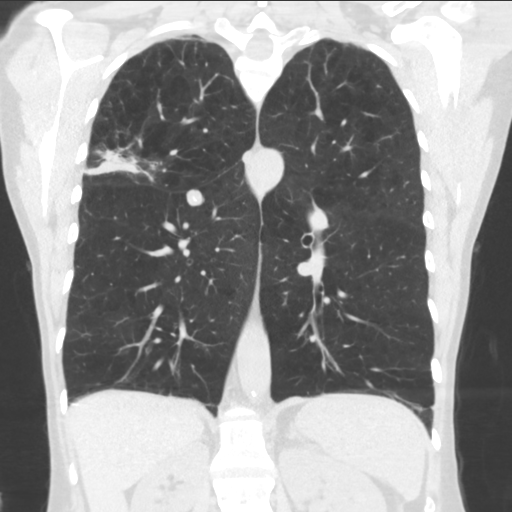

[15 of 36 positions shown; findings below may reference images not displayed]

FINDINGS: Cardiovascular: Minimal atherosclerotic changes throughout the
thoracic aorta. Heart size normal. No pericardial effusion. Limited
assessment without intravenous contrast. Central pulmonary
vasculature with main pulmonary artery at 3 cm.

Mediastinum/Nodes: No sign of adenopathy in the mediastinum.
Thoracic inlet structures are normal. Axillary structures are
unremarkable.

Lungs/Pleura: Severe pulmonary emphysema at the lung apices. Area of
architectural distortion in the right mid chest, associated with
partial consolidation ground-glass in some septal thickening. This
area measures approximately 5.6 by 2.1 cm. No signs of additional
consolidative change or evidence of pleural effusion.

Stable granuloma in the right lower lobe.

Airways are patent.

Upper Abdomen: No acute findings in the upper abdomen.

Musculoskeletal: No chest wall mass or suspicious bone lesions
identified.
IMPRESSION: 1. Findings of resolving pneumonia in the right chest in this
patient with emphysema. Suggest follow-up plain film evaluation in
8-12 weeks to ensure complete resolution.
2. Signs of prior granulomatous disease in the right lower lobe.
3. Mild lingular scarring similar to previous study of 01/28/2018.
4. Severe pulmonary emphysema at the lung apices.
5. Mild atherosclerotic changes in the thoracic aorta.

Aortic Atherosclerosis (GR1J8-KT5.5) and Emphysema (GR1J8-3U2.Q).

## 2022-01-13 ENCOUNTER — Other Ambulatory Visit: Payer: Self-pay | Admitting: Critical Care Medicine

## 2022-01-14 ENCOUNTER — Other Ambulatory Visit (HOSPITAL_COMMUNITY): Payer: Self-pay

## 2022-01-14 ENCOUNTER — Other Ambulatory Visit: Payer: Self-pay

## 2022-01-20 ENCOUNTER — Other Ambulatory Visit: Payer: Self-pay

## 2023-05-25 DIAGNOSIS — E876 Hypokalemia: Secondary | ICD-10-CM

## 2023-05-25 DIAGNOSIS — B348 Other viral infections of unspecified site: Secondary | ICD-10-CM

## 2023-05-25 DIAGNOSIS — J441 Chronic obstructive pulmonary disease with (acute) exacerbation: Secondary | ICD-10-CM

## 2023-05-25 DIAGNOSIS — J9621 Acute and chronic respiratory failure with hypoxia: Secondary | ICD-10-CM

## 2023-05-28 DIAGNOSIS — J9621 Acute and chronic respiratory failure with hypoxia: Secondary | ICD-10-CM

## 2023-05-28 DIAGNOSIS — E876 Hypokalemia: Secondary | ICD-10-CM

## 2023-05-28 DIAGNOSIS — B348 Other viral infections of unspecified site: Secondary | ICD-10-CM

## 2023-05-28 DIAGNOSIS — J441 Chronic obstructive pulmonary disease with (acute) exacerbation: Secondary | ICD-10-CM

## 2023-06-01 ENCOUNTER — Telehealth (INDEPENDENT_AMBULATORY_CARE_PROVIDER_SITE_OTHER): Payer: Self-pay | Admitting: PULMONARY DISEASE

## 2023-06-01 NOTE — Nursing Note (Signed)
Per Z pt needs a 2 to 4 wk hospital follow up with a PFT. Dr. Jearl Klinefelter consulted at [facility] on 05/25/2023. Pt was discharged on 05/31/2023.      No Micro      Please get pt scheduled

## 2023-06-22 ENCOUNTER — Encounter (INDEPENDENT_AMBULATORY_CARE_PROVIDER_SITE_OTHER): Payer: Self-pay | Admitting: NURSE PRACTITIONER

## 2023-06-23 ENCOUNTER — Other Ambulatory Visit: Payer: Self-pay

## 2023-06-23 ENCOUNTER — Encounter (INDEPENDENT_AMBULATORY_CARE_PROVIDER_SITE_OTHER): Payer: Self-pay | Admitting: NURSE PRACTITIONER

## 2023-06-23 ENCOUNTER — Inpatient Hospital Stay (INDEPENDENT_AMBULATORY_CARE_PROVIDER_SITE_OTHER)
Admission: RE | Admit: 2023-06-23 | Discharge: 2023-06-23 | Disposition: A | Discharge status: Home / Self Care | Payer: MEDICAID | Source: Ambulatory Visit | Attending: NURSE PRACTITIONER

## 2023-06-23 ENCOUNTER — Inpatient Hospital Stay (HOSPITAL_BASED_OUTPATIENT_CLINIC_OR_DEPARTMENT_OTHER)
Admission: RE | Admit: 2023-06-23 | Discharge: 2023-06-23 | Disposition: A | Discharge status: Home / Self Care | Payer: MEDICAID | Source: Ambulatory Visit

## 2023-06-23 ENCOUNTER — Ambulatory Visit: Payer: MEDICAID | Attending: NURSE PRACTITIONER | Admitting: NURSE PRACTITIONER

## 2023-06-23 ENCOUNTER — Other Ambulatory Visit (INDEPENDENT_AMBULATORY_CARE_PROVIDER_SITE_OTHER): Payer: Self-pay | Admitting: PULMONARY DISEASE

## 2023-06-23 ENCOUNTER — Telehealth (INDEPENDENT_AMBULATORY_CARE_PROVIDER_SITE_OTHER): Payer: Self-pay | Admitting: NURSE PRACTITIONER

## 2023-06-23 VITALS — BP 112/80 | HR 75 | Resp 17 | Ht 66.0 in | Wt 112.0 lb

## 2023-06-23 DIAGNOSIS — J449 Chronic obstructive pulmonary disease, unspecified: Secondary | ICD-10-CM

## 2023-06-23 DIAGNOSIS — G4710 Hypersomnia, unspecified: Secondary | ICD-10-CM

## 2023-06-23 DIAGNOSIS — R0602 Shortness of breath: Secondary | ICD-10-CM

## 2023-06-23 DIAGNOSIS — R0683 Snoring: Secondary | ICD-10-CM | POA: Insufficient documentation

## 2023-06-23 DIAGNOSIS — Z87891 Personal history of nicotine dependence: Secondary | ICD-10-CM | POA: Insufficient documentation

## 2023-06-23 MED ORDER — TRELEGY ELLIPTA 200 MCG-62.5 MCG-25 MCG POWDER FOR INHALATION
1.0000 | DISK | Freq: Every day | RESPIRATORY_TRACT | 3 refills | Status: DC
Start: 2023-06-23 — End: 2023-07-05

## 2023-06-23 MED ORDER — IPRATROPIUM 0.5 MG-ALBUTEROL 3 MG (2.5 MG BASE)/3 ML NEBULIZATION SOLN
3.0000 mL | INHALATION_SOLUTION | RESPIRATORY_TRACT | 3 refills | Status: DC | PRN
Start: 2023-06-23 — End: 2024-05-07

## 2023-06-23 MED ORDER — ALBUTEROL SULFATE HFA 90 MCG/ACTUATION AEROSOL INHALER
1.0000 | INHALATION_SPRAY | Freq: Four times a day (QID) | RESPIRATORY_TRACT | 3 refills | Status: AC | PRN
Start: 2023-06-23 — End: ?

## 2023-06-23 NOTE — Nursing Note (Signed)
Is the patient currently on oxygen therapy? Yes  What Liter Flow is the patient on?  2LPM PRN  What DME company does the patient utilize for oxygen therapy?  [employer]  What is the patient on? n/a  What DME company does the patient utilize for CPAP/BiPAP/Trilogy? N/A  Smoking History:    Social History     Tobacco Use   Smoking Status Not on file   Smokeless Tobacco Not on file      Have you received a flu shot? No  Have you received a pneumonia shot? Yes  Has the patient had any tests performed since the last visit? Chest Xray  If so, where were the test(s) performed?  [facility]   Has the patient had any recent hospitalizations? Yes  Does the patient have any other associated symptoms or modifying factors? Shortness of breath      Arnold Long, [place]

## 2023-06-23 NOTE — Telephone Encounter (Signed)
I sent patient down for 6 minute walk test today to see if she qualifies for POC.  If she does, can you please order this?  Thanks!

## 2023-06-23 NOTE — Procedures (Signed)
[facility]  PULMONARY FUNCTION TESTING, [facility] [place]  [address]  [place] [address]  [phone]    SIX MINUTE WALK    PERFORMING DEPT  PULMONARY FUNCTION TESTING[facility]        6 MIN WALK TEST INFO       6 MIN WALK PRE TEST DATA  Pre Test Data (at rest)  Pulse Ox: 93 %  Heart Rate: 102 bpm    1 MIN DATA  1 Minute Data  Pulse Ox: 90 %  Heart Rate: 116 bpm    2 MIN DATA  2 Minute Data  Pulse Ox: 92 % (room air)  Heart Rate: 112 bpm    3 MIN DATA  3 Minute Data  Pulse Ox: 87 % (room air)  Heart Rate: 120 bpm    4 MIN DATA  4 Minute Data  Pulse Ox: 93 % (2L pulse dose)  Heart Rate: 126 bpm    5 MIN DATA  5 Minute Data  Pulse Ox: 91 % (2L pulse dose)  Heart Rate: 94 bpm    6 MIN DATA  6 Minute Data (immediately after exercise)  Pulse Ox: 92 % (2L pulse dose)  Heart Rate: 99 bpm    5 MIN POST TEST  5 Minute Data (post exercise)  Pulse Ox: 93 % (2L pulse dose)  Heart Rate: 100 bpm    SUMMARY OF TEST DATA  Summary of test data  Number of Laps: 16  Additional meters: 0  Total laps x 30 meters + additional meters=: 480    PREVIOUS INFORMATION 6 MIN WALK TEST       Shawnee Knapp, RTR    Pulmonary attending  I reviewed the raw data from the 6 minute walk test in detail and agree with above.    Patient did desat.    Patient needs 2 L pulse dose oxygen with exertion.    Follow up in 45-90 days    Charlaine Dalton, MD  06/26/2023 16:21

## 2023-06-23 NOTE — Progress Notes (Signed)
PULMONARY, [facility] [place]  [address]  [place] [address]  Operated by [facility]     Follow up/Progress Note    Patient Name: Monica Wilson  Date: 06/23/2023  Department:  PULMONARY, [facility] [place]  MRN: [id number]  DOB: 09-21-1971  Primary Care Provider:  No Pcp  Referring Provider:  Referral Self      Chief Complaint:   Chief Complaint   Patient presents with    Hospital Discharge Transition     Pt was admitted to [facility] for shortness of breath and pneumonia. Pt was sent home with oxygen. Pt states she uses 2LPM when doing activities. Was recently diagnosed with COPD.     Nursing Notes:   Arnold Long, [place]  06/23/23 3295  Signed  Is the patient currently on oxygen therapy? Yes  What Liter Flow is the patient on?  2LPM PRN  What DME company does the patient utilize for oxygen therapy?  [employer]  What is the patient on? n/a  What DME company does the patient utilize for CPAP/BiPAP/Trilogy? N/A  Smoking History:    Social History     Tobacco Use   Smoking Status Not on file   Smokeless Tobacco Not on file      Have you received a flu shot? No  Have you received a pneumonia shot? Yes  Has the patient had any tests performed since the last visit? Chest Xray  If so, where were the test(s) performed?  [facility]   Has the patient had any recent hospitalizations? Yes  Does the patient have any other associated symptoms or modifying factors? Shortness of breath      Arnold Long, MA           HPI:    Patient with severe COPD and tobacco abuse here for hospital follow up.  She was initially seen during hospitalization at [facility] 05/25/2023.  She was treated for COPD exacerbation secondary to parainfluenza.  Chest x-ray showed no acute consolidation and hyperinflated lungs6 minute walk test showed desats and need for O2 with exertion upon discharge.    She presents today with PFTs that show severe obstruction with FEV1 of 32%, no BD  response, moderately decreased TLC at 57%, and severely decreased DLCO at 36%.     Patient recently moved here from [place] and currently lives in a shelter.  She  previously followed with a pulmonologist for COPD in [place] and [place].  She is currently on Symbicort, Spiriva, and albuterol p.r.n.Marland Kitchen  She was previously on Trelegy 200 and felt this helped her breathing the most.  She has been using 2 L O2 with exertion since discharge.  States she had previous sleep study that was nonrevealing.  However, she reports occasional snoring, fragmented smooth sleep, and hypersomnia mid day. She is interested in repeating sleep study.    She was a previous 35 pack-year smoker, but quit during hospitalization.  Has never had low-dose CT scans before.  States her father had lung cancer.     Past Medical History:  Past Medical History:   Diagnosis Date    COPD (chronic obstructive pulmonary disease) (CMS HCC)     Hyperlipidemia     Pre-diabetes          Past Surgical History  Past Surgical History:   Procedure Laterality Date    CESAREAN SECTION      DENTAL SURGERY      removal of every  tooth       Medication List  Current Outpatient Medications   Medication Sig    albuterol sulfate (PROVENTIL OR VENTOLIN OR PROAIR) 90 mcg/actuation Inhalation oral inhaler Take 1-2 Puffs by inhalation Every 6 hours as needed    atorvastatin (LIPITOR) 40 mg Oral Tablet Take 1 Tablet (40 mg total) by mouth Every night (Patient not taking: Reported on 06/23/2023)    ipratropium-albuterol 0.5 mg-3 mg(2.5 mg base)/3 mL Solution for Nebulization Take 3 mL by nebulization Every 4 hours as needed (shortness of breath)    metFORMIN (GLUCOPHAGE XR) 500 mg Oral Tablet Sustained Release 24 hr Take 1 Tablet (500 mg total) by mouth Every night (Patient not taking: Reported on 06/23/2023)    nicotine (NICODERM CQ) 14 mg/24 hr Transdermal Patch 24 hr     TRELEGY ELLIPTA 200-62.5-25 mcg Inhalation Disk with Device Take 1 Inhalation by inhalation Once a  day    VENTOLIN HFA 90 mcg/actuation Inhalation oral inhaler Take 2 Puffs by inhalation Every 6 hours as needed     Allergy List  Allergy History as of 06/23/23       PROPOXYPHENE N-ACETAMINOPHEN         Noted Status Severity Type Reaction    06/23/23 0847 Arnold Long, MA 06/23/23 Active                     Family History   Family Medical History:       Problem Relation (Age of Onset)    Lung Cancer Father    Uterine Cancer Mother            Social History  Social History     Socioeconomic History    Marital status: Single   Tobacco Use    Smoking status: Former     Current packs/day: 0.00     Average packs/day: 1 pack/day for 35.0 years (35.0 ttl pk-yrs)     Types: Cigarettes     Start date: 64     Quit date: 2024     Years since quitting: 0.5    Smokeless tobacco: Never   Vaping Use    Vaping status: Every Day        Review of system  General:  Denies fever, chills, night sweats, fatigue, loss of appetite.  Neurological:  Denies headache, snoring.   Gastrointestinal:  Denies reflux, heartburn, diarrhea.  Cardiovascular:  Denies chest pain, irregular heartbeats.  Respiratory:  See HPI  Musculoskeletal:  Denies joint pain, restless legs.  Endocrine/Metabolic:  Denies weight gain, weight loss.  Immunologic:  Denies sinus allergy symptoms.  Mental Status/Psychiatric:  Denies anxiety, depression.  ENT:  Denies nasal congestion, hoarseness, postnasal drip.  Integumentary:  Denies rash, new skin lesions.    Objective:  Vital Signs  BP 112/80 (Site: Left Arm, Patient Position: Sitting)   Pulse 75   Resp 17   Ht 1.676 m (5\' 6" )   Wt 50.8 kg (112 lb)   SpO2 97%   BMI 18.08 kg/m          PHYSICAL EXAMINATION:   Constitutional:  Vital signs stable.  General appearance of the patient:  Alert, no acute distress.  Normal appearance, well nourished.  Eyes: PERRLA and normal eye lids.  Conjunctiva normal.  Ears, Nose, Mouth, and Throat: External inspection of ears and nose with normal appearance.  Inspection of lips,  teeth and gums with normal appearance and oral mucosa normal.  Neck: Supple with trachea midline.  Respiratory:  Auscultation of lungs with normal breath sounds, no rales, no rhonchi, no wheezing.  Respiratory effort with no tractions, breathing regular and unlabored.  Cardiovascular:  Regular rhythm and regular rate.    Gastrointestinal: Abdomen non-distended.  Musculoskeletal:  Normal gait and station, normal digits, no digital cyanosis or clubbing.  Mental Status/Psychiatric:  Alert, grossly oriented to person, place, and time.  Appropriate and normal mood.    Assessment    ICD-10-CM    1. Chronic obstructive pulmonary disease, unspecified COPD type (CMS HCC)  J44.9 SIX MINUTE WALK TEST ( )      2. History of tobacco abuse  Z87.891 CT LUNG SCREENING LDCT      3. Snoring  R06.83 POLYSOMONGRAPHY - DIAGNOSTIC      4. Hypersomnia  G47.10 POLYSOMONGRAPHY - DIAGNOSTIC      5. SOB (shortness of breath)  R06.02             Plan  Instructions  Continue medications as prescribed/directed unless changed by provider.  Plan of care discussed with patient.    PFT's today reviewed and discussed with patient.  Findings showed severe obstruction, restriction on lung volumes, and severely decreased DLCO.  We will switch patient back to Trelegy and add duo nebs per her request.  Continue albuterol p.r.n..  We will check 6 minute walk test today per her request for POC qualification.     Stressed continued smoking cessation.  We will check LDCT prior to next visit.    We will also order PSG given her symptoms of possible sleep apnea.  Discussed sleep apnea and treatment options with patient, and she is agreeable to PAP therapy if needed.    Return to clinic in 21 weeks, but advised patient to call sooner if needed.     Return in about 21 weeks (around 11/17/2023) for Today, In Dr. Terrill Mohr.     The patient was given the opportunity to ask questions and those questions were answered to the patient's satisfaction. The  patient was encouraged to call with any additional questions or concerns. Discussed with the patient effects and side effects of medications. Medication safety was discussed.  The patient was informed to contact the office within 7 business days if a message/lab results/referral/imaging results have not been conveyed to the patient.    Electronically signed by Jen Mow, FNP-BC      This note may have been partially generated using MModal Fluency Direct system, and there may be some incorrect words, spellings, and punctuation that were not noted in checking the note before saving.

## 2023-06-26 ENCOUNTER — Inpatient Hospital Stay
Admission: RE | Admit: 2023-06-26 | Discharge: 2023-06-26 | Disposition: A | Discharge status: Home / Self Care | Payer: MEDICAID | Source: Ambulatory Visit | Attending: PULMONARY DISEASE | Admitting: PULMONARY DISEASE

## 2023-06-26 ENCOUNTER — Other Ambulatory Visit: Payer: Self-pay

## 2023-06-26 DIAGNOSIS — R0683 Snoring: Secondary | ICD-10-CM | POA: Insufficient documentation

## 2023-06-26 DIAGNOSIS — R400 Somnolence: Secondary | ICD-10-CM

## 2023-06-26 DIAGNOSIS — G4710 Hypersomnia, unspecified: Secondary | ICD-10-CM | POA: Insufficient documentation

## 2023-06-27 NOTE — Procedures (Signed)
[facility]    Sleep Study    Patient Name: Monica Wilson  Date of Birth: 03-31-1971  Gender: female  Provider: Ralph Dowdy  Location: [facility]  Location Address: [address] [place], [place], [place] [address] [facility]  Location Phone: [phone] [facility]      Primary Care Provider: No Pcp  Referring Provider: Ralph Dowdy    Sleep Study    Past Medical History  Past Medical History:   Diagnosis Date    COPD (chronic obstructive pulmonary disease) (CMS HCC)     Hyperlipidemia     Pre-diabetes            Past Surgical History  Past Surgical History:   Procedure Laterality Date    CESAREAN SECTION      DENTAL SURGERY      removal of every tooth           Medication LIst  No outpatient medications have been marked as taking for the 06/26/23 encounter [facility] Encounter) with PAC, SLEEP LAB.       Allergy List  Allergies   Allergen Reactions    Propoxyphene N-Acetaminophen        Family Medical History  Family Medical History:       Problem Relation (Age of Onset)    Lung Cancer Father    Uterine Cancer Mother              Social History  Social History     Socioeconomic History    Marital status: Single   Tobacco Use    Smoking status: Former     Current packs/day: 0.00     Average packs/day: 1 pack/day for 35.0 years (35.0 ttl pk-yrs)     Types: Cigarettes     Start date: 9     Quit date: 2024     Years since quitting: 0.5    Smokeless tobacco: Never   Vaping Use    Vaping status: Every Day         Patient Information  Sleep apnea symptoms: Excessive Daytime Sleepiness, Snoring, and Frequent Unexplained Arousals  Patient's Height: 66 in.  Patient's Weight: 112 lbs.  Patient's BMI: 18.1  Neck Circumference: 12.5 in.  Epworth Scale Score: 6    Date of Study: 06/26/23  Study Performed By: Jamison Neighbor RRT RPSGT   Study Reviewed By: Jamison Neighbor RRT RPSGT    Procedure : Procedure: Complete PSG with digital sleep system using the international 10-20 electrode placement for recording EEG, EOG, EMG  from chin, ECG, Respiratory effort, oximetry, body position, airflow, snoring sound, pulse rate, and limb movement channels.  Sleep Architecture: Total Recording Time: 427.5 min Total Sleep Time: 387 min. Sleep Efficiency Percentage: 90.5%. Patient Sleep Latency (Minutes): 24.5 min. Patient REM Latency (Minutes): 148.5 min. Percentage of Stage I Sleep:  0.9 %. Percentage of Stage II Sleep: 48.1 %. Percentage of Stage III Sleep: 38.5 %. Percentage (%) of REM sleep: 12.5 %.   Respiratory Data: Number of Obstructive Apneas Observed: 0 . Number of Central Apneas Observed: 0. Number of Mixed Apneas Observed: 0. Total Number of Apneas: 0. Total number of Hypopneas: 1. Snoring During the Study: Yes, moderate  Apnea Hypopnea Index (AHI) per hour: 0.2/hr. Percent of Time Patient Spent in Supine Position: 0.18% %. AHI in Supine Position #: 0. Non-supine AHI #: 0.16. REM AHI #: 0. Non-REM AHI #:  0.2. Lowest Desaturation Percentage: 89%. Total Amount of Desaturated Time Below or Equal to  0 min .  Longest Apneic Event (In Seconds): 18.5 seconds.   Cardiac Data: The Mean Heart Rate During the Study (Beats/Min): 64.3. Sinus Rhythm: Normal   Periodic Leg Movements: Total Number of Periodic Leg Movements During the Study: 5. PLM Index #: 0.8    The study was scored by: 30 second EPOCH, 4 % desaturation and  CMS guidelines.AASM Accredited     Assessment:   Diagnosis:   (R06.83) Snoring (SCT)    PLAN  Procedure Orders  OSA not present  Follow up as scheduled    Instructions:   Reviewed raw sleep data and agree with therapeutic directions outlined in report., Driving while drowsy was discussed in detail, Reviews of trustworthy websites recommended, Positional Therapy may be helpful in snoring, and Nasal steroids may be helpful for primary snoring    Impression  AHI 0.2/hr  All stages of sleep observed  Study on room air  Moderate snoring  Low SpO2 89%  OSA not observed    Marva Panda, RRT,RPSGT      Pulmonary Attending:  I have  personally reviewed the raw data for the sleep study and  I agree with the assessment and plan as above.    Scoring seem appropriate.   Sleep apnea not observed  Will need to follow up in the office to review face to face within 90 days.     Duard Brady, DO  06/27/2023 16:51

## 2023-06-28 ENCOUNTER — Encounter (INDEPENDENT_AMBULATORY_CARE_PROVIDER_SITE_OTHER): Payer: Self-pay | Admitting: PULMONARY DISEASE

## 2023-06-28 NOTE — Nursing Note (Signed)
Sp to pt gave ss results, No OSA Monica Wilson, CRT

## 2023-07-05 ENCOUNTER — Telehealth (INDEPENDENT_AMBULATORY_CARE_PROVIDER_SITE_OTHER): Payer: Self-pay | Admitting: PULMONARY DISEASE

## 2023-07-05 DIAGNOSIS — J449 Chronic obstructive pulmonary disease, unspecified: Secondary | ICD-10-CM

## 2023-07-05 MED ORDER — ANORO ELLIPTA 62.5 MCG-25 MCG/ACTUATION POWDER FOR INHALATION
1.0000 | DISK | Freq: Every day | RESPIRATORY_TRACT | 5 refills | Status: DC
Start: 2023-07-05 — End: 2024-01-18

## 2023-07-05 MED ORDER — BUDESONIDE 180 MCG/ACTUATION BREATH ACTIVATED POWDER INHALER
1.0000 | INHALATION_SPRAY | Freq: Two times a day (BID) | RESPIRATORY_TRACT | 5 refills | Status: DC
Start: 2023-07-05 — End: 2024-01-18

## 2023-07-05 NOTE — Telephone Encounter (Signed)
Pt called and LM that she needs PA on trelegy  Will need to try and fail Pulmicort and anoro first

## 2023-11-17 ENCOUNTER — Ambulatory Visit (INDEPENDENT_AMBULATORY_CARE_PROVIDER_SITE_OTHER): Payer: Self-pay | Admitting: PHYSICIAN ASSISTANT

## 2023-12-27 ENCOUNTER — Ambulatory Visit (INDEPENDENT_AMBULATORY_CARE_PROVIDER_SITE_OTHER): Payer: Self-pay

## 2024-01-04 ENCOUNTER — Ambulatory Visit (INDEPENDENT_AMBULATORY_CARE_PROVIDER_SITE_OTHER): Payer: Self-pay | Admitting: PHYSICIAN ASSISTANT

## 2024-01-18 ENCOUNTER — Other Ambulatory Visit (INDEPENDENT_AMBULATORY_CARE_PROVIDER_SITE_OTHER): Payer: Self-pay | Admitting: PULMONARY DISEASE

## 2024-01-18 DIAGNOSIS — J449 Chronic obstructive pulmonary disease, unspecified: Secondary | ICD-10-CM

## 2024-01-23 ENCOUNTER — Ambulatory Visit (INDEPENDENT_AMBULATORY_CARE_PROVIDER_SITE_OTHER): Payer: Self-pay

## 2024-01-29 ENCOUNTER — Ambulatory Visit (INDEPENDENT_AMBULATORY_CARE_PROVIDER_SITE_OTHER): Payer: Self-pay | Admitting: PULMONARY DISEASE

## 2024-02-21 ENCOUNTER — Other Ambulatory Visit (INDEPENDENT_AMBULATORY_CARE_PROVIDER_SITE_OTHER): Payer: Self-pay | Admitting: PULMONARY DISEASE

## 2024-02-21 DIAGNOSIS — J449 Chronic obstructive pulmonary disease, unspecified: Secondary | ICD-10-CM

## 2024-02-23 ENCOUNTER — Ambulatory Visit (INDEPENDENT_AMBULATORY_CARE_PROVIDER_SITE_OTHER): Payer: Self-pay

## 2024-02-26 ENCOUNTER — Other Ambulatory Visit: Payer: Self-pay

## 2024-02-26 ENCOUNTER — Ambulatory Visit (INDEPENDENT_AMBULATORY_CARE_PROVIDER_SITE_OTHER)
Admission: RE | Admit: 2024-02-26 | Discharge: 2024-02-26 | Discharge status: Home / Self Care | Source: Ambulatory Visit | Attending: PULMONARY DISEASE

## 2024-02-26 DIAGNOSIS — Z87891 Personal history of nicotine dependence: Secondary | ICD-10-CM | POA: Insufficient documentation

## 2024-02-27 ENCOUNTER — Telehealth (INDEPENDENT_AMBULATORY_CARE_PROVIDER_SITE_OTHER): Payer: Self-pay | Admitting: NURSE PRACTITIONER

## 2024-02-27 ENCOUNTER — Ambulatory Visit (INDEPENDENT_AMBULATORY_CARE_PROVIDER_SITE_OTHER): Payer: Self-pay | Admitting: NURSE PRACTITIONER

## 2024-02-27 DIAGNOSIS — Z87891 Personal history of nicotine dependence: Secondary | ICD-10-CM

## 2024-02-27 DIAGNOSIS — Z122 Encounter for screening for malignant neoplasm of respiratory organs: Secondary | ICD-10-CM

## 2024-02-27 DIAGNOSIS — J439 Emphysema, unspecified: Secondary | ICD-10-CM

## 2024-02-27 DIAGNOSIS — R911 Solitary pulmonary nodule: Secondary | ICD-10-CM

## 2024-02-27 NOTE — Result Encounter Note (Signed)
 Low-dose CT shows stable right lower lobe lung nodule when compared to exam 12/16/2020, basilar opacities likely related to scarring, and pulmonary emphysema.  Patient notified of results.

## 2024-02-27 NOTE — Telephone Encounter (Signed)
 Please reschedule patient's office visit.  Thanks!

## 2024-02-28 ENCOUNTER — Other Ambulatory Visit (INDEPENDENT_AMBULATORY_CARE_PROVIDER_SITE_OTHER): Payer: Self-pay | Admitting: PULMONARY DISEASE

## 2024-02-28 DIAGNOSIS — J449 Chronic obstructive pulmonary disease, unspecified: Secondary | ICD-10-CM

## 2024-02-28 NOTE — Telephone Encounter (Signed)
 Called and left vm for pt with scheduling, pt is scheduled for 7/2 with Dr. Eggleston. Notified that pt may call and check for cancellations if she would like to be seen sooner. Mailing appt card

## 2024-02-29 NOTE — Telephone Encounter (Signed)
 Refilling enough of these medications so patient can be seen in office.    Patient will need appt prior to us  refilling anymore medication.

## 2024-03-04 NOTE — Telephone Encounter (Signed)
 Called and Spoke with Pt. Rescheduled her for 05/07/24 which was the first available appt. Will mail out reminder as well.

## 2024-05-07 ENCOUNTER — Other Ambulatory Visit: Payer: Self-pay

## 2024-05-07 ENCOUNTER — Encounter (INDEPENDENT_AMBULATORY_CARE_PROVIDER_SITE_OTHER): Payer: Self-pay | Admitting: NURSE PRACTITIONER

## 2024-05-07 ENCOUNTER — Ambulatory Visit: Payer: Self-pay | Attending: NURSE PRACTITIONER | Admitting: NURSE PRACTITIONER

## 2024-05-07 VITALS — BP 108/74 | HR 81 | Temp 97.8°F | Resp 18 | Ht 68.0 in | Wt 115.0 lb

## 2024-05-07 DIAGNOSIS — G4710 Hypersomnia, unspecified: Secondary | ICD-10-CM | POA: Insufficient documentation

## 2024-05-07 DIAGNOSIS — J449 Chronic obstructive pulmonary disease, unspecified: Secondary | ICD-10-CM | POA: Insufficient documentation

## 2024-05-07 DIAGNOSIS — Z87891 Personal history of nicotine dependence: Secondary | ICD-10-CM | POA: Insufficient documentation

## 2024-05-07 DIAGNOSIS — R0602 Shortness of breath: Secondary | ICD-10-CM | POA: Insufficient documentation

## 2024-05-07 MED ORDER — PREDNISONE 10 MG TABLET
ORAL_TABLET | ORAL | 0 refills | Status: AC
Start: 2024-05-07 — End: 2024-05-15
  Filled 2024-05-07: qty 12, 8d supply, fill #0

## 2024-05-07 MED ORDER — ALBUTEROL SULFATE 2.5 MG/3 ML (0.083 %) SOLUTION FOR NEBULIZATION
2.5000 mg | INHALATION_SOLUTION | RESPIRATORY_TRACT | 11 refills | Status: AC | PRN
Start: 2024-05-07 — End: ?
  Filled 2024-05-07: qty 360, 20d supply, fill #0
  Filled 2024-06-27: qty 360, 20d supply, fill #1

## 2024-05-07 MED ORDER — TRELEGY ELLIPTA 100 MCG-62.5 MCG-25 MCG POWDER FOR INHALATION
1.0000 | DISK | Freq: Every day | RESPIRATORY_TRACT | 11 refills | Status: DC
Start: 2024-05-07 — End: 2024-07-08
  Filled 2024-05-07: qty 1, fill #0
  Filled 2024-07-01: qty 60, 30d supply, fill #0
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
## 2024-05-07 NOTE — Nursing Note (Signed)
 Is the patient currently on oxygen therapy? Yes PRN  What Liter Flow is the patient on?  0.5-1LPM  What DME company does the patient utilize for oxygen therapy?  [employer]  What is the patient on? n/a  What DME company does the patient utilize for CPAP/BiPAP/Trilogy? N/A  Smoking History:    Social History     Tobacco Use   Smoking Status Former    Current packs/day: 0.00    Average packs/day: 1 pack/day for 35.0 years (35.0 ttl pk-yrs)    Types: Cigarettes    Start date: 54    Quit date: 2024    Years since quitting: 1.4   Smokeless Tobacco Never      Have you received a flu shot? Yes  Have you received a pneumonia shot? Yes  Has the patient had any tests performed since the last visit? CT  If so, where were the test(s) performed?  [facility]  Has the patient had any recent hospitalizations? No  Does the patient have any other associated symptoms or modifying factors? Shortness of breath       Jerlyn Moons, MA

## 2024-06-05 ENCOUNTER — Ambulatory Visit (INDEPENDENT_AMBULATORY_CARE_PROVIDER_SITE_OTHER): Payer: Self-pay | Admitting: PULMONARY DISEASE

## 2024-06-27 ENCOUNTER — Other Ambulatory Visit: Payer: Self-pay

## 2024-06-27 ENCOUNTER — Other Ambulatory Visit (INDEPENDENT_AMBULATORY_CARE_PROVIDER_SITE_OTHER): Payer: Self-pay

## 2024-06-27 DIAGNOSIS — J449 Chronic obstructive pulmonary disease, unspecified: Secondary | ICD-10-CM

## 2024-06-27 NOTE — Telephone Encounter (Signed)
 Pt called - trelegy needs PA - [id number]

## 2024-06-28 ENCOUNTER — Other Ambulatory Visit: Payer: Self-pay

## 2024-07-01 ENCOUNTER — Other Ambulatory Visit: Payer: Self-pay

## 2024-07-04 ENCOUNTER — Other Ambulatory Visit: Payer: Self-pay

## 2024-07-05 ENCOUNTER — Other Ambulatory Visit: Payer: Self-pay

## 2024-07-08 ENCOUNTER — Other Ambulatory Visit: Payer: Self-pay

## 2024-07-08 MED ORDER — UMECLIDINIUM 62.5 MCG-VILANTEROL 25 MCG/ACTUATION POWDR FOR INHALATION
1.0000 | DISK | Freq: Every day | RESPIRATORY_TRACT | 5 refills | Status: AC
Start: 2024-07-08 — End: ?
  Filled 2024-07-08: qty 60, 30d supply, fill #0

## 2024-07-08 MED ORDER — ARNUITY ELLIPTA 100 MCG/ACTUATION POWDER FOR INHALATION
1.0000 | DISK | Freq: Every day | RESPIRATORY_TRACT | 5 refills | Status: AC
Start: 2024-07-08 — End: ?
  Filled 2024-07-08: qty 30, 30d supply, fill #0

## 2024-07-08 NOTE — Addendum Note (Signed)
 Addended by: ROME EARL on: 07/08/2024 01:33 PM     Modules accepted: Orders

## 2024-07-08 NOTE — Telephone Encounter (Signed)
 I received denial from [employer] today-pt must try and fail 30 days of Anoro with Arnuity before approval will be given. I called pt, explained this, she was agreeable. I advised on using the alcohol based mouthwash after each use. I will have inhalers sent to her pharmacy now.    She was very Adult nurse.

## 2024-07-08 NOTE — Addendum Note (Signed)
 Addended by: MARCINE FADEN on: 07/08/2024 02:40 PM     Modules accepted: Orders

## 2024-07-09 ENCOUNTER — Other Ambulatory Visit: Payer: Self-pay

## 2024-07-12 ENCOUNTER — Other Ambulatory Visit: Payer: Self-pay

## 2024-08-13 ENCOUNTER — Other Ambulatory Visit: Payer: Self-pay

## 2024-11-19 ENCOUNTER — Ambulatory Visit (INDEPENDENT_AMBULATORY_CARE_PROVIDER_SITE_OTHER): Payer: Self-pay | Admitting: NURSE PRACTITIONER

## 2024-11-19 ENCOUNTER — Ambulatory Visit (INDEPENDENT_AMBULATORY_CARE_PROVIDER_SITE_OTHER): Payer: Self-pay
